# Patient Record
Sex: Male | Born: 1945 | Race: White | Hispanic: No | Marital: Married | Smoking: Former smoker
Health system: Southern US, Community
[De-identification: ages and names within clinical notes are randomized; demographics above are authoritative.]

## PROBLEM LIST (undated history)

## (undated) DIAGNOSIS — Z5189 Encounter for other specified aftercare: Secondary | ICD-10-CM

## (undated) DIAGNOSIS — J449 Chronic obstructive pulmonary disease, unspecified: Secondary | ICD-10-CM

## (undated) DIAGNOSIS — K219 Gastro-esophageal reflux disease without esophagitis: Secondary | ICD-10-CM

## (undated) DIAGNOSIS — F419 Anxiety disorder, unspecified: Secondary | ICD-10-CM

## (undated) DIAGNOSIS — Z923 Personal history of irradiation: Secondary | ICD-10-CM

## (undated) DIAGNOSIS — E7800 Pure hypercholesterolemia, unspecified: Secondary | ICD-10-CM

## (undated) DIAGNOSIS — I10 Essential (primary) hypertension: Secondary | ICD-10-CM

## (undated) DIAGNOSIS — K259 Gastric ulcer, unspecified as acute or chronic, without hemorrhage or perforation: Secondary | ICD-10-CM

## (undated) DIAGNOSIS — N189 Chronic kidney disease, unspecified: Secondary | ICD-10-CM

## (undated) DIAGNOSIS — C801 Malignant (primary) neoplasm, unspecified: Secondary | ICD-10-CM

## (undated) DIAGNOSIS — R7303 Prediabetes: Secondary | ICD-10-CM

## (undated) DIAGNOSIS — D126 Benign neoplasm of colon, unspecified: Secondary | ICD-10-CM

## (undated) HISTORY — DX: Anxiety disorder, unspecified: F41.9

## (undated) HISTORY — DX: Chronic obstructive pulmonary disease, unspecified: J44.9

## (undated) HISTORY — DX: Gastric ulcer, unspecified as acute or chronic, without hemorrhage or perforation: K25.9

## (undated) HISTORY — DX: Encounter for other specified aftercare: Z51.89

## (undated) HISTORY — DX: Gastro-esophageal reflux disease without esophagitis: K21.9

## (undated) HISTORY — DX: Benign neoplasm of colon, unspecified: D12.6

---

## 2000-03-29 ENCOUNTER — Ambulatory Visit (HOSPITAL_COMMUNITY): Admission: RE | Admit: 2000-03-29 | Discharge: 2000-03-29 | Payer: Self-pay | Admitting: *Deleted

## 2005-04-26 ENCOUNTER — Ambulatory Visit (HOSPITAL_COMMUNITY): Admission: RE | Admit: 2005-04-26 | Discharge: 2005-04-26 | Payer: Self-pay | Admitting: Gastroenterology

## 2006-12-05 HISTORY — PX: COLONOSCOPY: SHX174

## 2011-11-21 ENCOUNTER — Encounter: Payer: Self-pay | Admitting: Emergency Medicine

## 2011-11-21 ENCOUNTER — Inpatient Hospital Stay (HOSPITAL_COMMUNITY)
Admission: EM | Admit: 2011-11-21 | Discharge: 2011-11-24 | DRG: 174 | Disposition: A | Discharge status: Home / Self Care | Payer: BC Managed Care – PPO | Attending: Internal Medicine | Admitting: Internal Medicine

## 2011-11-21 DIAGNOSIS — E860 Dehydration: Secondary | ICD-10-CM

## 2011-11-21 DIAGNOSIS — M79606 Pain in leg, unspecified: Secondary | ICD-10-CM

## 2011-11-21 DIAGNOSIS — D649 Anemia, unspecified: Secondary | ICD-10-CM

## 2011-11-21 DIAGNOSIS — M79609 Pain in unspecified limb: Secondary | ICD-10-CM | POA: Diagnosis present

## 2011-11-21 DIAGNOSIS — D62 Acute posthemorrhagic anemia: Secondary | ICD-10-CM | POA: Diagnosis present

## 2011-11-21 DIAGNOSIS — I10 Essential (primary) hypertension: Secondary | ICD-10-CM

## 2011-11-21 DIAGNOSIS — I951 Orthostatic hypotension: Secondary | ICD-10-CM | POA: Diagnosis present

## 2011-11-21 DIAGNOSIS — R42 Dizziness and giddiness: Secondary | ICD-10-CM

## 2011-11-21 DIAGNOSIS — K269 Duodenal ulcer, unspecified as acute or chronic, without hemorrhage or perforation: Secondary | ICD-10-CM | POA: Diagnosis present

## 2011-11-21 DIAGNOSIS — S32009A Unspecified fracture of unspecified lumbar vertebra, initial encounter for closed fracture: Secondary | ICD-10-CM | POA: Diagnosis present

## 2011-11-21 DIAGNOSIS — W19XXXA Unspecified fall, initial encounter: Secondary | ICD-10-CM | POA: Diagnosis present

## 2011-11-21 DIAGNOSIS — M549 Dorsalgia, unspecified: Secondary | ICD-10-CM | POA: Diagnosis present

## 2011-11-21 DIAGNOSIS — K264 Chronic or unspecified duodenal ulcer with hemorrhage: Principal | ICD-10-CM | POA: Diagnosis present

## 2011-11-21 DIAGNOSIS — E7800 Pure hypercholesterolemia, unspecified: Secondary | ICD-10-CM | POA: Diagnosis present

## 2011-11-21 HISTORY — DX: Essential (primary) hypertension: I10

## 2011-11-21 HISTORY — DX: Pure hypercholesterolemia, unspecified: E78.00

## 2011-11-21 NOTE — ED Notes (Signed)
Dizziness and vertigo all day. Worse tonight. Pt clammy, diaphoretic upon EMS arrival. 12 Lead NSR. Stoke assessment negative per EMS. Pt reports pain and visual disturbances to L eye. Pt has fallen twice this evening and has a skin tear on L elbow. Pt lives with wife. Pt a/o x 4 per EMS.

## 2011-11-21 NOTE — ED Notes (Signed)
ZOX:[id number]<AV> Expected date:11/21/11<BR> Expected time:10:50 PM<BR> Means of arrival:Ambulance<BR> Comments:<BR> EMS 5 RK, 65 yom fall w dizziness

## 2011-11-22 ENCOUNTER — Emergency Department (HOSPITAL_COMMUNITY): Payer: BC Managed Care – PPO

## 2011-11-22 ENCOUNTER — Encounter (HOSPITAL_COMMUNITY): Payer: Self-pay | Admitting: Nurse Practitioner

## 2011-11-22 ENCOUNTER — Other Ambulatory Visit: Payer: Self-pay

## 2011-11-22 DIAGNOSIS — I10 Essential (primary) hypertension: Secondary | ICD-10-CM | POA: Diagnosis not present

## 2011-11-22 DIAGNOSIS — I959 Hypotension, unspecified: Secondary | ICD-10-CM

## 2011-11-22 DIAGNOSIS — W19XXXA Unspecified fall, initial encounter: Secondary | ICD-10-CM | POA: Diagnosis present

## 2011-11-22 DIAGNOSIS — K922 Gastrointestinal hemorrhage, unspecified: Secondary | ICD-10-CM

## 2011-11-22 DIAGNOSIS — M79606 Pain in leg, unspecified: Secondary | ICD-10-CM | POA: Diagnosis present

## 2011-11-22 DIAGNOSIS — I951 Orthostatic hypotension: Secondary | ICD-10-CM | POA: Diagnosis present

## 2011-11-22 DIAGNOSIS — R42 Dizziness and giddiness: Secondary | ICD-10-CM | POA: Diagnosis present

## 2011-11-22 DIAGNOSIS — M549 Dorsalgia, unspecified: Secondary | ICD-10-CM | POA: Diagnosis present

## 2011-11-22 DIAGNOSIS — E860 Dehydration: Secondary | ICD-10-CM | POA: Diagnosis present

## 2011-11-22 DIAGNOSIS — D649 Anemia, unspecified: Secondary | ICD-10-CM

## 2011-11-22 LAB — CBC
HCT: 27.6 % — ABNORMAL LOW (ref 39.0–52.0)
MCH: 32.8 pg (ref 26.0–34.0)
MCH: 32.3 pg (ref 26.0–34.0)
MCHC: 34.9 g/dL (ref 30.0–36.0)
MCV: 91.4 fL (ref 78.0–100.0)
MCV: 92.6 fL (ref 78.0–100.0)
Platelets: 138 10*3/uL — ABNORMAL LOW (ref 150–400)
Platelets: 122 10*3/uL — ABNORMAL LOW (ref 150–400)
RBC: 3.02 MIL/uL — ABNORMAL LOW (ref 4.22–5.81)
RDW: 12.9 % (ref 11.5–15.5)
RDW: 13 % (ref 11.5–15.5)

## 2011-11-22 LAB — COMPREHENSIVE METABOLIC PANEL
ALT: 16 U/L (ref 0–53)
Calcium: 8.7 mg/dL (ref 8.4–10.5)
Creatinine, Ser: 1.26 mg/dL (ref 0.50–1.35)
GFR calc Af Amer: 67 mL/min — ABNORMAL LOW (ref >90)
GFR calc non Af Amer: 58 mL/min — ABNORMAL LOW (ref >90)
Glucose, Bld: 108 mg/dL — ABNORMAL HIGH (ref 70–99)
Sodium: 142 mEq/L (ref 135–145)
Total Protein: 5.4 g/dL — ABNORMAL LOW (ref 6.0–8.3)

## 2011-11-22 LAB — URINALYSIS, ROUTINE W REFLEX MICROSCOPIC
Glucose, UA: NEGATIVE mg/dL
Ketones, ur: NEGATIVE mg/dL
Leukocytes, UA: NEGATIVE
Nitrite: NEGATIVE
Protein, ur: NEGATIVE mg/dL
Urobilinogen, UA: 0.2 mg/dL (ref 0.0–1.0)

## 2011-11-22 LAB — TROPONIN I: Troponin I: <0.3 ng/mL (ref <0.30)

## 2011-11-22 LAB — DIFFERENTIAL
Eosinophils Absolute: 0.1 10*3/uL (ref 0.0–0.7)
Eosinophils Relative: 1 % (ref 0–5)
Lymphs Abs: 3.3 10*3/uL (ref 0.7–4.0)
Monocytes Absolute: 0.7 10*3/uL (ref 0.1–1.0)

## 2011-11-22 LAB — CREATININE, SERUM: Creatinine, Ser: 1.28 mg/dL (ref 0.50–1.35)

## 2011-11-22 LAB — PROTIME-INR: Prothrombin Time: 14.4 seconds (ref 11.6–15.2)

## 2011-11-22 LAB — ETHANOL: Alcohol, Ethyl (B): <11 mg/dL (ref 0–11)

## 2011-11-22 LAB — CARDIAC PANEL(CRET KIN+CKTOT+MB+TROPI)
CK, MB: 2 ng/mL (ref 0.3–4.0)
Total CK: 74 U/L (ref 7–232)
Troponin I: <0.3 ng/mL (ref <0.30)

## 2011-11-22 MED ORDER — SENNA 8.6 MG PO TABS
1.0000 | ORAL_TABLET | Freq: Two times a day (BID) | ORAL | Status: DC
  Administered 2011-11-23 – 2011-11-24 (×3): 8.6 mg via ORAL
  Filled 2011-11-22 (×2): qty 1

## 2011-11-22 MED ORDER — LORAZEPAM 2 MG/ML IJ SOLN
INTRAMUSCULAR | Status: AC
  Filled 2011-11-22: qty 1

## 2011-11-22 MED ORDER — HYDROMORPHONE HCL PF 1 MG/ML IJ SOLN: 1.0000 mg | INTRAMUSCULAR | Status: DC | PRN

## 2011-11-22 MED ORDER — SODIUM CHLORIDE 0.9 % IV SOLN
INTRAVENOUS | Status: DC
  Administered 2011-11-22 (×2): via INTRAVENOUS

## 2011-11-22 MED ORDER — ASPIRIN EC 81 MG PO TBEC: 81.0000 mg | DELAYED_RELEASE_TABLET | Freq: Every day | ORAL | Status: DC

## 2011-11-22 MED ORDER — ENOXAPARIN SODIUM 40 MG/0.4ML ~~LOC~~ SOLN
40.0000 mg | SUBCUTANEOUS | Status: DC
  Administered 2011-11-22: 40 mg via SUBCUTANEOUS
  Filled 2011-11-22: qty 0.4

## 2011-11-22 MED ORDER — ONDANSETRON HCL 4 MG PO TABS: 4.0000 mg | ORAL_TABLET | Freq: Four times a day (QID) | ORAL | Status: DC | PRN

## 2011-11-22 MED ORDER — SODIUM CHLORIDE 0.9 % IV SOLN
8.0000 mg/h | INTRAVENOUS | Status: DC
  Administered 2011-11-22 – 2011-11-23 (×2): 8 mg/h via INTRAVENOUS
  Filled 2011-11-22 (×7): qty 80

## 2011-11-22 MED ORDER — SODIUM CHLORIDE 0.9 % IV BOLUS (SEPSIS)
500.0000 mL | Freq: Once | INTRAVENOUS | Status: AC
  Administered 2011-11-22: 500 mL via INTRAVENOUS

## 2011-11-22 MED ORDER — ACETAMINOPHEN 650 MG RE SUPP: 650.0000 mg | Freq: Four times a day (QID) | RECTAL | Status: DC | PRN

## 2011-11-22 MED ORDER — ONDANSETRON HCL 4 MG/2ML IJ SOLN
4.0000 mg | Freq: Once | INTRAMUSCULAR | Status: AC
  Administered 2011-11-22: 4 mg via INTRAVENOUS
  Filled 2011-11-22: qty 2

## 2011-11-22 MED ORDER — ROSUVASTATIN CALCIUM 20 MG PO TABS
20.0000 mg | ORAL_TABLET | Freq: Every day | ORAL | Status: DC
  Administered 2011-11-23 – 2011-11-24 (×2): 20 mg via ORAL
  Filled 2011-11-22 (×2): qty 1

## 2011-11-22 MED ORDER — SODIUM CHLORIDE 0.9 % IV SOLN: INTRAVENOUS | Status: DC

## 2011-11-22 MED ORDER — SODIUM CHLORIDE 0.9 % IV BOLUS (SEPSIS)
500.0000 mL | Freq: Once | INTRAVENOUS | Status: AC
  Administered 2011-11-22: 500 mL via INTRAVENOUS
  Administered 2011-11-22: 08:00:00 via INTRAVENOUS

## 2011-11-22 MED ORDER — SODIUM CHLORIDE 0.45 % IV SOLN: Freq: Once | INTRAVENOUS | Status: DC

## 2011-11-22 MED ORDER — ONDANSETRON HCL 4 MG/2ML IJ SOLN: 4.0000 mg | Freq: Four times a day (QID) | INTRAMUSCULAR | Status: DC | PRN

## 2011-11-22 MED ORDER — OXYCODONE HCL 5 MG PO TABS: 5.0000 mg | ORAL_TABLET | ORAL | Status: DC | PRN

## 2011-11-22 MED ORDER — LORAZEPAM 2 MG/ML IJ SOLN
1.0000 mg | Freq: Once | INTRAMUSCULAR | Status: AC
  Administered 2011-11-22: 1 mg via INTRAVENOUS

## 2011-11-22 MED ORDER — IOHEXOL 300 MG/ML  SOLN
100.0000 mL | Freq: Once | INTRAMUSCULAR | Status: AC | PRN
  Administered 2011-11-22: 100 mL via INTRAVENOUS

## 2011-11-22 MED ORDER — ACETAMINOPHEN 325 MG PO TABS: 650.0000 mg | ORAL_TABLET | Freq: Four times a day (QID) | ORAL | Status: DC | PRN

## 2011-11-22 NOTE — Consult Note (Signed)
[facility] Consultation  Referring Provider: [facility] Cleotis Lema) Primary Care Physician:  Kristian Covey, MD Primary Gastroenterologist:   Formerly Hung(discharged from practice). Dr. Christella Hartigan accepts him as patient. Reason for Consultation:  Hematochezia.   HPI: Frantz Quattrone is a 65 y.o. male man in relatively good health in the ER after an acute episode of shortness of breath/ dizziness and fall at home last night around 9pm. In ER patient was hypotensive. His chest CTscan is negative. His head CTscan is negative as well. Patient was hypotensive in ER with SBP of 79. He is now hemodynamically stable after IV fluid resusitation. A few hours ago patient passed a large bloody stool which wife describes as black and sticky with some bright red blood in it. Patient denies any abdominal pain. No nausea. He has taken a baby asa for years but no other NSAIDS. No history of PUD. He apparently had a colonoscopy about 4 years ago by Dr. Elnoria Howard. Wife recalls removal of a couple of polyps. She doesn't recall any findings of diverticulosis. No FMH of colon cancer. Patient's wife is a patient of Dr. Christella Hartigan  Past Medical History  Diagnosis Date  . Hypertension   . High cholesterol     History reviewed. No pertinent past surgical history.  Prior to Admission medications   Medication Sig Start Date End Date Taking? Authorizing Provider  aspirin EC 81 MG tablet Take 81 mg by mouth daily.     Yes Historical Provider, MD  benazepril (LOTENSIN) 20 MG tablet Take 20 mg by mouth daily.     Yes Historical Provider, MD  rosuvastatin (CRESTOR) 20 MG tablet Take 20 mg by mouth daily.     Yes Historical Provider, MD    Current Facility-Administered Medications  Medication Dose Route Frequency Provider Last Rate Last Dose  . 0.9 %  sodium chloride infusion   Intravenous Continuous Flint Melter, MD 125 mL/hr at 11/22/11 1158    . LORazepam (ATIVAN) 2 MG/ML injection           . LORazepam  (ATIVAN) injection 1 mg  1 mg Intravenous Once Sosan Abdullah   1 mg at 11/22/11 0910  . ondansetron (ZOFRAN) injection 4 mg  4 mg Intravenous Once Flint Melter, MD   4 mg at 11/22/11 0428  . sodium chloride 0.9 % bolus 500 mL  500 mL Intravenous Once Flint Melter, MD   500 mL at 11/22/11 0428  . sodium chloride 0.9 % bolus 500 mL  500 mL Intravenous Once Flint Melter, MD      . DISCONTD: enoxaparin (LOVENOX) injection 40 mg  40 mg Subcutaneous Q24H Sosan Abdullah   40 mg at 11/22/11 1354   Current Outpatient Prescriptions  Medication Sig Dispense Refill  . aspirin EC 81 MG tablet Take 81 mg by mouth daily.        . benazepril (LOTENSIN) 20 MG tablet Take 20 mg by mouth daily.        . rosuvastatin (CRESTOR) 20 MG tablet Take 20 mg by mouth daily.          Allergies as of 11/21/2011  . (No Known Allergies)    FMH: No colon cancer   History   Social History  . Marital Status: Married    Spouse Name: N/A    Number of Children: N/A  . Years of Education: N/A   Occupational History  . Not on file.   Social History Main Topics  . Smoking status: Never  Smoker   . Smokeless tobacco: Not on file  . Alcohol Use: No  . Drug Use:   . Sexually Active:    Other Topics Concern  . Not on file   Social History Narrative  . No narrative on file    Review of Systems: Positive for chronic back pain, weakness.   PHYSICAL EXAM: Vital signs in last 24 hours: Temp:  [98.2 F (36.8 C)-98.4 F (36.9 C)] 98.2 F (36.8 C) (12/18 1010) Pulse Rate:  [78-119] 78  (12/18 1539) Resp:  [16] 16  (12/18 1010) BP: (79-112)/(56-92) 110/68 mmHg (12/18 1539) SpO2:  [94 %-100 %] 100 % (12/18 1539)   General:  Well-developed  white male in NAD Head:  Normocephalic and atraumatic. Eyes:   No icterus.   Conjunctiva pale. Ears:  Normal auditory acuity. Mouth:  Tongue slightly dry. Neck:  Supple; no masses felt Lungs:  Respirations even and unlabored. Lungs clear to auscultation  bilaterlly.   No wheezes, crackles, or rhonchi.  Heart:  Regular rate and rhythm Abdomen:  Soft, nondistended, nontender. Normal bowel sounds. No appreciable masses or hepatomegaly.  Rectal:  Several hemorrhoids. Dark red stool in vault..  Msk:  Symmetrical without gross deformities.  Extremities:  Without edema. Neurologic:  Alert and  oriented x4;  grossly normal neurologically. Skin:  Intact without significant lesions or rashes. Cervical Nodes:  No significant cervical adenopathy. Psych:  Alert and cooperative.Marland Kitchen   LAB RESULTS:  Basename 11/22/11 0400  WBC 10.9*  HGB 9.9*  HCT 27.6*  PLT 138*   BMET  Basename 11/22/11 0400  NA 142  K 4.2  CL 110  CO2 26  GLUCOSE 108*  BUN 39*  CREATININE 1.26  CALCIUM 8.7   LFT  Basename 11/22/11 0400  PROT 5.4*  ALBUMIN 3.1*  AST 12  ALT 16  ALKPHOS 51  BILITOT 0.1*  BILIDIR --  IBILI --   STUDIES: Dg Chest 2 View  11/22/2011  *RADIOLOGY REPORT*  Clinical Data: Weakness, soreness.  CHEST - 2 VIEW  Comparison: None.  Findings: Lungs are clear.  No pleural effusion or pneumothorax. No acute osseous abnormality.  Cardiomediastinal contours within normal limits.  IMPRESSION: No acute process identified.  Original Report Authenticated By: Waneta Martins, M.D.   Dg Lumbar Spine Complete  11/22/2011  *RADIOLOGY REPORT*  Clinical Data: Left leg pain, weakness, soreness.  LUMBAR SPINE - COMPLETE 4+ VIEW  Comparison: None.  Findings: Mild multilevel degenerative changes.  No acute fracture or dislocation identified.  The vertebral body heights and intervertebral disc spaces are maintained.  Advanced atherosclerotic calcification.  SI joints appear intact.  The sacrum is partially obscured by overlying bowel.  IMPRESSION: Mild multilevel degenerative changes.  No acute osseous abnormality identified.  Original Report Authenticated By: Waneta Martins, M.D.   Ct Head Wo Contrast  11/22/2011  *RADIOLOGY REPORT*  Clinical Data:  Dizziness, vertigo.  CT HEAD WITHOUT CONTRAST  Technique:  Contiguous axial images were obtained from the base of the skull through the vertex without contrast.  Comparison: None.  Findings: There is no evidence for acute hemorrhage, hydrocephalus, mass lesion, or abnormal extra-axial fluid collection.  No definite CT evidence for acute infarction.  The visualized paranasal sinuses and mastoid air cells are predominately clear.  IMPRESSION: No acute intracranial abnormality.  Original Report Authenticated By: Waneta Martins, M.D.   Mr Thoracic Spine Wo Contrast  11/22/2011  **ADDENDUM** CREATED: 11/22/2011 11:21:15  There was no given history of fall or trauma at  the time of initial interpretation however I did receive a call from the hospitalist caring for the patient. Prior radiograph history specifically states no injury.  Patient did have recent fall and subsequent onset of back pain and radicular pain.  This makes the abnormal signal with bone marrow edema at S3 consistent with nondisplaced sacral fracture.  We discussed further evaluation with CT scan (recommended by orthopedics) of the sacrum which would probably not yield additional data based on the history and MRI findings.  Per CMS PQRS reporting requirements (PQRS Measure 24): Given the patient's age of greater than 50 and the fracture site (hip, distal radius, or spine), the patient should be tested for osteoporosis using DXA, and the appropriate treatment considered based on the DXA results.  **END ADDENDUM** SIGNED BY: Andreas Newport, M.D.   11/22/2011  *RADIOLOGY REPORT*  Clinical Data:  Sudden onset mid to low back pain with left leg weakness.  Claustrophobia.  Focal left leg weakness.  MRI THORACIC AND LUMBAR SPINE WITHOUT CONTRAST  Technique:  Multiplanar and multiecho pulse sequences of the thoracic and lumbar spine were obtained without intravenous contrast.  Comparison:  11/22/2011.  MRI THORACIC SPINE  Findings: Thoracic spinal  alignment is anatomic.  Counting was performed from the craniocervical junction.  Thoracic and lumbar vertebral levels are anatomic.  No segmentation abnormalities.  The thoracic cord demonstrates normal caliber and intramedullary signal.  Vertebral body height is preserved.  Marrow signal is normal with exception of a hemangioma or degenerative endplate changes in the inferior aspect of L1.  The paraspinal soft tissues appear within normal limits.  There is no spinal or foraminal stenosis identified.  IMPRESSION: Negative thoracic spine MRI.  MRI LUMBAR SPINE  Findings: The patient was unable to perform sagittal T2-weighted images.  Inversion recovery images were obtained.  Vertebral body height is preserved.  The axial images are motion degraded.  There is a tiny focus of bone marrow edema in the sacrum at S2-S3, suspicious for nondisplaced fracture.  This is not visualized on axial images.  L1-L2:  Mild disc degeneration with anterior bulging.  No stenosis.  L2-L3:  Mild disc degeneration.  No stenosis.  L3-L4:  Negative.  L4-L5:  Circumferential disc bulge.  Mild bilateral foraminal stenosis associated with bulging disc.   Mild degenerative endplate changes with subchondral marrow edema on the right. Central canal and lateral recesses grossly appear patent.  L5-S1:  Shallow circumferential disc bulge.  IMPRESSION: 1.  Motion degraded study.  Repeat images were attempted however the patient continued to move throughout the study and axial images were markedly motion degraded.  2.  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 3.  Suggestion of a nondisplaced sacral fracture at S3 with mild marrow edema is seen on sagittal images. Original Report Authenticated By: Andreas Newport, M.D.    PREVIOUS ENDOSCOPIES: [facility] several years ago, colonoscopy, polyps were found, he was told to have repeat in 3 years but didn't.  Was discharged from [facility]  practice for unclear reasons  IMPRESSION / PLAN: 31. 65 year old white male with acute shortness of breath, dizziness, and hypotension leading to a fall at home last night. Chest CTscan and head CT negative. See #2.  2. Painless hematochezia associated with hypotension. This could actually be an upper GI bleed, especially given elevated BUN. Of course, he could have ischemic colitis secondary to hypotension but this seems less likely. Diverticular bleed seems less likely given details of  events. Favor UGIB. Patient needs close monitoring, recommend  stepdown instead of telemetry. Will begin Protonix drip, check coags, monitor H&H. Keep patient NPO, type and cross blood. Plan is for EGD tomorrow depending on clinical course. Should we start to favor a lower bleed then he may be prepped for a colonoscopy instead.  3.  Normocytic anemia, Hemoglobin 9.9. No baseline hemoglobin available to compare. Given hypotension and dizziness I have low threshold for transfusing him. Another hemoglobin is pending, if less than 9.0 will order 2 units PRBC to be given.  Thanks   LOS: 1 day   Willette Cluster  11/22/2011, 4:18 PM     ________________________________________________________________________  [facility] MD note:  I personally examined the patient, reviewed the data and agree with the assessment and plan described above. GI bleeding.  BUN elevated, this seems more likely an upper GI bleed. He was given lovenox in ER before the bleeding became apparent.  I think he should be admitted to step down unit, IV PPI bolus and drip, then EGD tomorrow AM.  Hb is 12 hours old (he has been in ER for almost 20 hours now) and so will repeat now and transfuse blood products as needed.  Needs coags with cbc as well, then q 8 hour H/H.     Rob Bunting, MD [facility] Pager [phone]

## 2011-11-22 NOTE — ED Notes (Signed)
Pt c/o difficulty breathing. Pt o2 sats 100% on room air. Family at bedside. Pt c/o L leg cramping.

## 2011-11-22 NOTE — ED Notes (Signed)
Patient returned from X-ray 

## 2011-11-22 NOTE — ED Notes (Signed)
Called pharmacy regarding protonix drip.  They are going to send it.

## 2011-11-22 NOTE — ED Notes (Signed)
Patient transported to CT 

## 2011-11-22 NOTE — H&P (Addendum)
PCP:  Kristian Covey, MD   DOA:  11/21/2011 10:57 PM  Chief Complaint:  Dizziness Left leg pain and lower back pain. Status post fall HPI: 65 years old Caucasian man presented to the ER today with chief complaint of dizziness and lightheadedness. Patient stated that his symptoms worsen with standing. He is feeling generally weak and had an episode of shortness of breath this morning which has resolved. He denies any associated chest pain, palpitation, nausea or diaphoresis. Patient stated that he felt dizzy and weak this morning and fell, he was unable to provide details about his fall. He denies any nausea, vomiting, diarrhea or decreasing his oral intake. Patient is also complaining of chronic lower back pain and right leg pain which he described as dull, 5/10 he denies any numbness, bladder or bowel incontinence. In the ED patient was found to be orthostatic and hypotensive and was given IV fluids. Had an MRI of lumbosacral spine which showed degenerative joint disease and suspected sacral fracture.  Allergies: No Known Allergies  Prior to Admission medications   Medication Sig Start Date End Date Taking? Authorizing Provider  aspirin EC 81 MG tablet Take 81 mg by mouth daily.     Yes Historical Provider, MD  benazepril (LOTENSIN) 20 MG tablet Take 20 mg by mouth daily.     Yes Historical Provider, MD  rosuvastatin (CRESTOR) 20 MG tablet Take 20 mg by mouth daily.     Yes Historical Provider, MD    Past Medical History  Diagnosis Date  . Hypertension   . High cholesterol       Chronic back pain   Social History: He lives with his wife in [place], smokes one pack a day of cigarette for more than 20 years, denies any EtOH or illicit drug use.   family history : Father had a heart attack in his 30s  Review of Systems:  Constitutional: Denies fever, chills, diaphoresis, appetite change and fatigue.  Respiratory: transient  SOB, deniesDOE, cough, chest tightness,  and  wheezing.   Cardiovascular: Denies chest pain, palpitations and leg swelling.  Gastrointestinal: Denies nausea, vomiting, abdominal pain, diarrhea, constipation, blood in stool and abdominal distention.  Genitourinary: Denies dysuria, urgency, frequency, hematuria, flank pain and difficulty urinating.  Musculoskeletal: Denies myalgias,C/O back pain and left leg pain, joint swelling,and gait problem.  Skin: Denies pallor, rash and wound.  Neurological: c/o dizziness/ light-headedness, denies seizures, syncope, weakness,, numbness and headaches.    Physical Exam:  Alert but drowsy, oriented x3  Filed Vitals:   11/22/11 0743 11/22/11 0905 11/22/11 0911 11/22/11 1010  BP: 112/68 101/68 106/84 105/61  Pulse: 82 88 87 86  Temp: 98.4 F (36.9 C)   98.2 F (36.8 C)  TempSrc: Oral   Oral  Resp:    16  SpO2: 100%   95%    Alert and oriented x3.  Eyes: PERRL, EOMI, conjunctivae normal, No scleral icterus.  Neck: Supple, Trachea midline normal ROM, No JVD, mass, thyromegaly, or carotid bruit present.  Cardiovascular: RRR, S1 normal, S2 normal, no MRG, pulses symmetric and intact bilaterally Pulmonary/Chest: CTAB, no wheezes, rales, or rhonchi Abdominal: Soft. Non-tender, non-distended, bowel sounds are normal, no masses, organomegaly, or guarding present.  GU: no CVA tenderness Musculoskeletal: No joint deformities, erythema, or stiffness, left leg /hip with LROM  Ext: no edema and no cyanosis, pulses palpable bilaterally (DP and PT) Neurological: A&O x3, Strenght is normal and symmetric bilaterally, cranial nerve II-XII are grossly intact, no focal motor deficit, sensory  intact to light touch bilaterally.     Labs on Admission:  Results for orders placed during the hospital encounter of 11/21/11 (from the past 48 hour(s))  CBC     Status: Abnormal   Collection Time   11/22/11  4:00 AM      Component Value Range Comment   WBC 10.9 (*) 4.0 - 10.5 (K/uL)    RBC 3.02 (*) 4.22 - 5.81  (MIL/uL)    Hemoglobin 9.9 (*) 13.0 - 17.0 (g/dL)    HCT 40.9 (*) 81.1 - 52.0 (%)    MCV 91.4  78.0 - 100.0 (fL)    MCH 32.8  26.0 - 34.0 (pg)    MCHC 35.9  30.0 - 36.0 (g/dL)    RDW 91.4  78.2 - 95.6 (%)    Platelets 138 (*) 150 - 400 (K/uL)   DIFFERENTIAL     Status: Normal   Collection Time   11/22/11  4:00 AM      Component Value Range Comment   Neutrophils Relative 62  43 - 77 (%)    Neutro Abs 6.7  1.7 - 7.7 (K/uL)    Lymphocytes Relative 30  12 - 46 (%)    Lymphs Abs 3.3  0.7 - 4.0 (K/uL)    Monocytes Relative 7  3 - 12 (%)    Monocytes Absolute 0.7  0.1 - 1.0 (K/uL)    Eosinophils Relative 1  0 - 5 (%)    Eosinophils Absolute 0.1  0.0 - 0.7 (K/uL)    Basophils Relative 0  0 - 1 (%)    Basophils Absolute 0.0  0.0 - 0.1 (K/uL)   COMPREHENSIVE METABOLIC PANEL     Status: Abnormal   Collection Time   11/22/11  4:00 AM      Component Value Range Comment   Sodium 142  135 - 145 (mEq/L)    Potassium 4.2  3.5 - 5.1 (mEq/L)    Chloride 110  96 - 112 (mEq/L)    CO2 26  19 - 32 (mEq/L)    Glucose, Bld 108 (*) 70 - 99 (mg/dL)    BUN 39 (*) 6 - 23 (mg/dL)    Creatinine, Ser 2.13  0.50 - 1.35 (mg/dL)    Calcium 8.7  8.4 - 10.5 (mg/dL)    Total Protein 5.4 (*) 6.0 - 8.3 (g/dL)    Albumin 3.1 (*) 3.5 - 5.2 (g/dL)    AST 12  0 - 37 (U/L)    ALT 16  0 - 53 (U/L)    Alkaline Phosphatase 51  39 - 117 (U/L)    Total Bilirubin 0.1 (*) 0.3 - 1.2 (mg/dL)    GFR calc non Af Amer 58 (*) >90 (mL/min)    GFR calc Af Amer 67 (*) >90 (mL/min)   TROPONIN I     Status: Normal   Collection Time   11/22/11  4:00 AM      Component Value Range Comment   Troponin I <0.30  <0.30 (ng/mL)   ETHANOL     Status: Normal   Collection Time   11/22/11  4:00 AM      Component Value Range Comment   Alcohol, Ethyl (B) <11  0 - 11 (mg/dL)   URINALYSIS, ROUTINE W REFLEX MICROSCOPIC     Status: Abnormal   Collection Time   11/22/11  5:51 AM      Component Value Range Comment   Color, Urine YELLOW  YELLOW      APPearance  CLOUDY (*) CLEAR     Specific Gravity, Urine 1.021  1.005 - 1.030     pH 5.5  5.0 - 8.0     Glucose, UA NEGATIVE  NEGATIVE (mg/dL)    Hgb urine dipstick NEGATIVE  NEGATIVE     Bilirubin Urine NEGATIVE  NEGATIVE     Ketones, ur NEGATIVE  NEGATIVE (mg/dL)    Protein, ur NEGATIVE  NEGATIVE (mg/dL)    Urobilinogen, UA 0.2  0.0 - 1.0 (mg/dL)    Nitrite NEGATIVE  NEGATIVE     Leukocytes, UA NEGATIVE  NEGATIVE  MICROSCOPIC NOT DONE ON URINES WITH NEGATIVE PROTEIN, BLOOD, LEUKOCYTES, NITRITE, OR GLUCOSE <1000 mg/dL.    Radiological Exams on Admission: No results found. Dg Chest 2 View  11/22/2011  *RADIOLOGY REPORT*  Clinical Data: Weakness, soreness.  CHEST - 2 VIEW  Comparison:  IMPRESSION: No acute process identified.  Original Report Authenticated By: Waneta Martins, M.D.   Dg Lumbar Spine Complete  11/22/2011  *RADIOLOGY REPORT*  Clinical Data: Left leg pain, weakness, soreness.  LUMBAR SPINE - COMPLETE .  IMPRESSION: Mild multilevel degenerative changes.  No acute osseous abnormality identified.  Original Report Authenticated By: Waneta Martins, M.D.   Ct Head Wo Contrast  11/22/2011  *RADIOLOGY REPORT*  Clinical Data: Dizziness, vertigo.  CT HEAD WITHOUT CONTRAST  .  IMPRESSION: No acute intracranial abnormality.  Original Report Authenticated By: Waneta Martins, M.D.   Mr Thoracic Spine Wo Contrast  11/22/2011  *RADIOLOGY REPORT*  Clinical Data:  Sudden onset mid to low back pain with left leg weakness.  Claustrophobia.  Focal left leg weakness.  MRI THORACIC .  IMPRESSION: Negative thoracic spine MRI.  MRI LUMBAR SPINE  Findings: The patient was unable to perform sagittal T2-weighted images.  Inversion recovery images were obtained.  Vertebral body height is preserved.  The axial images are motion degraded.  There is a tiny focus of bone marrow edema in the sacrum at S2-S3, suspicious for nondisplaced fracture.  This is not visualized on axial images.  L1-L2:   Mild disc degeneration with anterior bulging.  No stenosis.  L2-L3:  Mild disc degeneration.  No stenosis.  L3-L4:  Negative.  L4-L5:  Circumferential disc bulge.  Mild bilateral foraminal stenosis associated with bulging disc.   Mild degenerative endplate changes with subchondral marrow edema on the right. Central canal and lateral recesses grossly appear patent.  L5-S1:  Shallow circumferential disc bulge.  IMPRESSION: 1.  Motion degraded study.  Repeat images were attempted however the patient continued to move throughout the study and axial images were markedly motion degraded.  2.  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 3.  Suggestion of a nondisplaced sacral fracture at S3 with mild marrow edema is seen on sagittal images.  Original Report Authenticated By: Andreas Newport, M.D.  Assessment/Plan Principal Problem:  *Orthostasis/Dizzness /Dehydration Active Problems:  Back pain/left leg pain/suspected sacral fracture   Hypertension  Fall Anemia Plan: Admit to telemetry Continue IV fluid and bolus as needed, hold benazepril Compression stockings to help with orthostasis Given transient shortness of breath and family history of cardiac disease we'll cycle cardiac enzymes and check d-dimer.EKG showed NSR and nonspecific T wave changes .check 2 D Echo  Pain control and PT evaluation .[place] [facility] was  Consulted  . Check anemia panel, and a stool for occult blood Patient is full code    Time Spent on Admission: 45 minutes the patient had voided amount  of dark clot I loose stool. Lovenox and aspirin were discontinued, I ordered for type and screen, H&H, [facility] was consulted.   Addendum I was informed by RN  Lindsay Straka 11/22/2011, 10:35 AM

## 2011-11-22 NOTE — Progress Notes (Signed)
  Echocardiogram 2D Echocardiogram has been performed.  Jack Taylor, RDCS 11/22/2011, 1:18 PM

## 2011-11-22 NOTE — ED Notes (Signed)
Blood bank notified to crossmatch for two units. [place] Dec)

## 2011-11-22 NOTE — ED Notes (Signed)
Jack Taylor from MRI-patient states is extremely claustrophobic. MD notified.

## 2011-11-22 NOTE — ED Notes (Signed)
MD at bedside. 

## 2011-11-22 NOTE — ED Notes (Signed)
Moderate amount of dark, clot type loose stool in bedside commode. Dr. Cleotis Lema beeped.

## 2011-11-22 NOTE — Consult Note (Signed)
Reason for Consult:Back pain Referring Physician: Hospitalist  Jack Taylor is an 65 y.o. male.  HPI: Patient is a 65 year old gentleman who complained of lightheaded dizziness last couple of days fell yesterday morning landing on backside with some lower buttocks discomfort. This was brought to the emergency room for evaluation x-rays were unremarkable but an MRI of the lumbar spine showed questionable nondisplaced fracture of S3. Patient reports he has been having some right leg pain recently but no numbness or tingling.  Past Medical History  Diagnosis Date  . Hypertension   . High cholesterol     History reviewed. No pertinent past surgical history.  Family History  Problem Relation Age of Onset  . Colon cancer Neg Hx     Social History:  reports that he has never smoked. He does not have any smokeless tobacco history on file. He reports that he does not drink alcohol. His drug history not on file.  Allergies: No Known Allergies  Medications: I have reviewed the patient's current medications.  Results for orders placed during the hospital encounter of 11/21/11 (from the past 48 hour(s))  CBC     Status: Abnormal   Collection Time   11/22/11  4:00 AM      Component Value Range Comment   WBC 10.9 (*) 4.0 - 10.5 (K/uL)    RBC 3.02 (*) 4.22 - 5.81 (MIL/uL)    Hemoglobin 9.9 (*) 13.0 - 17.0 (g/dL)    HCT 16.1 (*) 09.6 - 52.0 (%)    MCV 91.4  78.0 - 100.0 (fL)    MCH 32.8  26.0 - 34.0 (pg)    MCHC 35.9  30.0 - 36.0 (g/dL)    RDW 04.5  40.9 - 81.1 (%)    Platelets 138 (*) 150 - 400 (K/uL)   DIFFERENTIAL     Status: Normal   Collection Time   11/22/11  4:00 AM      Component Value Range Comment   Neutrophils Relative 62  43 - 77 (%)    Neutro Abs 6.7  1.7 - 7.7 (K/uL)    Lymphocytes Relative 30  12 - 46 (%)    Lymphs Abs 3.3  0.7 - 4.0 (K/uL)    Monocytes Relative 7  3 - 12 (%)    Monocytes Absolute 0.7  0.1 - 1.0 (K/uL)    Eosinophils Relative 1  0 - 5 (%)    Eosinophils  Absolute 0.1  0.0 - 0.7 (K/uL)    Basophils Relative 0  0 - 1 (%)    Basophils Absolute 0.0  0.0 - 0.1 (K/uL)   COMPREHENSIVE METABOLIC PANEL     Status: Abnormal   Collection Time   11/22/11  4:00 AM      Component Value Range Comment   Sodium 142  135 - 145 (mEq/L)    Potassium 4.2  3.5 - 5.1 (mEq/L)    Chloride 110  96 - 112 (mEq/L)    CO2 26  19 - 32 (mEq/L)    Glucose, Bld 108 (*) 70 - 99 (mg/dL)    BUN 39 (*) 6 - 23 (mg/dL)    Creatinine, Ser 9.14  0.50 - 1.35 (mg/dL)    Calcium 8.7  8.4 - 10.5 (mg/dL)    Total Protein 5.4 (*) 6.0 - 8.3 (g/dL)    Albumin 3.1 (*) 3.5 - 5.2 (g/dL)    AST 12  0 - 37 (U/L)    ALT 16  0 - 53 (U/L)  Alkaline Phosphatase 51  39 - 117 (U/L)    Total Bilirubin 0.1 (*) 0.3 - 1.2 (mg/dL)    GFR calc non Af Amer 58 (*) >90 (mL/min)    GFR calc Af Amer 67 (*) >90 (mL/min)   TROPONIN I     Status: Normal   Collection Time   11/22/11  4:00 AM      Component Value Range Comment   Troponin I <0.30  <0.30 (ng/mL)   ETHANOL     Status: Normal   Collection Time   11/22/11  4:00 AM      Component Value Range Comment   Alcohol, Ethyl (B) <11  0 - 11 (mg/dL)   URINALYSIS, ROUTINE W REFLEX MICROSCOPIC     Status: Abnormal   Collection Time   11/22/11  5:51 AM      Component Value Range Comment   Color, Urine YELLOW  YELLOW     APPearance CLOUDY (*) CLEAR     Specific Gravity, Urine 1.021  1.005 - 1.030     pH 5.5  5.0 - 8.0     Glucose, UA NEGATIVE  NEGATIVE (mg/dL)    Hgb urine dipstick NEGATIVE  NEGATIVE     Bilirubin Urine NEGATIVE  NEGATIVE     Ketones, ur NEGATIVE  NEGATIVE (mg/dL)    Protein, ur NEGATIVE  NEGATIVE (mg/dL)    Urobilinogen, UA 0.2  0.0 - 1.0 (mg/dL)    Nitrite NEGATIVE  NEGATIVE     Leukocytes, UA NEGATIVE  NEGATIVE  MICROSCOPIC NOT DONE ON URINES WITH NEGATIVE PROTEIN, BLOOD, LEUKOCYTES, NITRITE, OR GLUCOSE <1000 mg/dL.  D-DIMER, QUANTITATIVE     Status: Abnormal   Collection Time   11/22/11 11:02 AM      Component Value  Range Comment   D-Dimer, Quant 1.08 (*) 0.00 - 0.48 (ug/mL-FEU)   CARDIAC PANEL(CRET KIN+CKTOT+MB+TROPI)     Status: Normal   Collection Time   11/22/11 12:10 PM      Component Value Range Comment   Total CK 74  7 - 232 (U/L)    CK, MB 2.0  0.3 - 4.0 (ng/mL)    Troponin I <0.30  <0.30 (ng/mL)    Relative Index RELATIVE INDEX IS INVALID  0.0 - 2.5    TYPE AND SCREEN     Status: Normal (Preliminary result)   Collection Time   11/22/11  5:10 PM      Component Value Range Comment   ABO/RH(D) A POS      Antibody Screen NEG      Sample Expiration 11/25/2011      Unit Number [id number]      Blood Component Type RED CELLS,LR      Unit division 00      Status of Unit ALLOCATED      Transfusion Status OK TO TRANSFUSE      Crossmatch Result Compatible      Unit Number [id number]      Blood Component Type RED CELLS,LR      Unit division 00      Status of Unit ISSUED      Transfusion Status OK TO TRANSFUSE      Crossmatch Result Compatible     CBC     Status: Abnormal   Collection Time   11/22/11  5:10 PM      Component Value Range Comment   WBC 6.8  4.0 - 10.5 (K/uL)    RBC 2.69 (*) 4.22 - 5.81 (MIL/uL)    Hemoglobin  8.7 (*) 13.0 - 17.0 (g/dL)    HCT 91.4 (*) 78.2 - 52.0 (%)    MCV 92.6  78.0 - 100.0 (fL)    MCH 32.3  26.0 - 34.0 (pg)    MCHC 34.9  30.0 - 36.0 (g/dL)    RDW 95.6  21.3 - 08.6 (%)    Platelets 122 (*) 150 - 400 (K/uL)   CREATININE, SERUM     Status: Abnormal   Collection Time   11/22/11  5:10 PM      Component Value Range Comment   Creatinine, Ser 1.28  0.50 - 1.35 (mg/dL)    GFR calc non Af Amer 57 (*) >90 (mL/min)    GFR calc Af Amer 66 (*) >90 (mL/min)   PROTIME-INR     Status: Normal   Collection Time   11/22/11  5:10 PM      Component Value Range Comment   Prothrombin Time 14.4  11.6 - 15.2 (seconds)    INR 1.10  0.00 - 1.49    ABO/RH     Status: Normal   Collection Time   11/22/11  5:10 PM      Component Value Range Comment   ABO/RH(D) A POS       PREPARE RBC (CROSSMATCH)     Status: Normal   Collection Time   11/22/11  6:17 PM      Component Value Range Comment   Order Confirmation ORDER PROCESSED BY BLOOD BANK       Dg Chest 2 View  11/22/2011  *RADIOLOGY REPORT*  Clinical Data: Weakness, soreness.  CHEST - 2 VIEW  Comparison: None.  Findings: Lungs are clear.  No pleural effusion or pneumothorax. No acute osseous abnormality.  Cardiomediastinal contours within normal limits.  IMPRESSION: No acute process identified.  Original Report Authenticated By: Waneta Martins, M.D.   Dg Lumbar Spine Complete  11/22/2011  *RADIOLOGY REPORT*  Clinical Data: Left leg pain, weakness, soreness.  LUMBAR SPINE - COMPLETE 4+ VIEW  Comparison: None.  Findings: Mild multilevel degenerative changes.  No acute fracture or dislocation identified.  The vertebral body heights and intervertebral disc spaces are maintained.  Advanced atherosclerotic calcification.  SI joints appear intact.  The sacrum is partially obscured by overlying bowel.  IMPRESSION: Mild multilevel degenerative changes.  No acute osseous abnormality identified.  Original Report Authenticated By: Waneta Martins, M.D.   Ct Head Wo Contrast  11/22/2011  *RADIOLOGY REPORT*  Clinical Data: Dizziness, vertigo.  CT HEAD WITHOUT CONTRAST  Technique:  Contiguous axial images were obtained from the base of the skull through the vertex without contrast.  Comparison: None.  Findings: There is no evidence for acute hemorrhage, hydrocephalus, mass lesion, or abnormal extra-axial fluid collection.  No definite CT evidence for acute infarction.  The visualized paranasal sinuses and mastoid air cells are predominately clear.  IMPRESSION: No acute intracranial abnormality.  Original Report Authenticated By: Waneta Martins, M.D.   Ct Angio Chest W/cm &/or Wo Cm  11/22/2011  *RADIOLOGY REPORT*  Clinical Data:  Dizziness.  Hypertension.  Elevated D-dimer level. Leg pain.  CT ANGIOGRAPHY CHEST WITH  CONTRAST  Technique:  Multidetector CT imaging of the chest was performed using the standard protocol during bolus administration of intravenous contrast.  Multiplanar CT image reconstructions including MIPs were obtained to evaluate the vascular anatomy.  Contrast: OMNIPAQUE IOHEXOL 300 MG/ML IV SOLN  Comparison:  Chest radiograph dated 11/22/2011  Findings:  Atherosclerotic calcification of the aortic arch and of the  proximal branch vessels noted.  No mediastinal or pathologic hilar adenopathy noted.  Coronary artery atherosclerotic calcification in the left anterior descending coronary artery is noted.  No filling defect is identified in the pulmonary arterial tree to suggest pulmonary embolus.  No aortic dissection is noted although there is some atherosclerotic calcification and mild intimal thickening in the descending thoracic aorta.  The lungs appear clear.  Mild thoracic spondylosis noted.  Review of the MIP images confirms the above findings.  IMPRESSION:  1.  Atherosclerosis as noted above. 2.  No pulmonary embolus or aortic dissection is observed.  Original Report Authenticated By: Dellia Cloud, M.D.   Mr Thoracic Spine Wo Contrast  11/22/2011  **ADDENDUM** CREATED: 11/22/2011 11:21:15  There was no given history of fall or trauma at the time of initial interpretation however I did receive a call from the hospitalist caring for the patient. Prior radiograph history specifically states no injury.  Patient did have recent fall and subsequent onset of back pain and radicular pain.  This makes the abnormal signal with bone marrow edema at S3 consistent with nondisplaced sacral fracture.  We discussed further evaluation with CT scan (recommended by orthopedics) of the sacrum which would probably not yield additional data based on the history and MRI findings.  Per CMS PQRS reporting requirements (PQRS Measure 24): Given the patient's age of greater than 50 and the fracture site (hip, distal  radius, or spine), the patient should be tested for osteoporosis using DXA, and the appropriate treatment considered based on the DXA results.  **END ADDENDUM** SIGNED BY: Andreas Newport, M.D.   11/22/2011  *RADIOLOGY REPORT*  Clinical Data:  Sudden onset mid to low back pain with left leg weakness.  Claustrophobia.  Focal left leg weakness.  MRI THORACIC AND LUMBAR SPINE WITHOUT CONTRAST  Technique:  Multiplanar and multiecho pulse sequences of the thoracic and lumbar spine were obtained without intravenous contrast.  Comparison:  11/22/2011.  MRI THORACIC SPINE  Findings: Thoracic spinal alignment is anatomic.  Counting was performed from the craniocervical junction.  Thoracic and lumbar vertebral levels are anatomic.  No segmentation abnormalities.  The thoracic cord demonstrates normal caliber and intramedullary signal.  Vertebral body height is preserved.  Marrow signal is normal with exception of a hemangioma or degenerative endplate changes in the inferior aspect of L1.  The paraspinal soft tissues appear within normal limits.  There is no spinal or foraminal stenosis identified.  IMPRESSION: Negative thoracic spine MRI.  MRI LUMBAR SPINE  Findings: The patient was unable to perform sagittal T2-weighted images.  Inversion recovery images were obtained.  Vertebral body height is preserved.  The axial images are motion degraded.  There is a tiny focus of bone marrow edema in the sacrum at S2-S3, suspicious for nondisplaced fracture.  This is not visualized on axial images.  L1-L2:  Mild disc degeneration with anterior bulging.  No stenosis.  L2-L3:  Mild disc degeneration.  No stenosis.  L3-L4:  Negative.  L4-L5:  Circumferential disc bulge.  Mild bilateral foraminal stenosis associated with bulging disc.   Mild degenerative endplate changes with subchondral marrow edema on the right. Central canal and lateral recesses grossly appear patent.  L5-S1:  Shallow circumferential disc bulge.  IMPRESSION: 1.   Motion degraded study.  Repeat images were attempted however the patient continued to move throughout the study and axial images were markedly motion degraded.  2.  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 3.  Suggestion of a nondisplaced sacral fracture at S3 with mild marrow edema is seen on sagittal images. Original Report Authenticated By: Andreas Newport, M.D.    Review of Systems  HENT: Negative.   Eyes: Negative.   Respiratory: Negative.   Cardiovascular: Negative.   Musculoskeletal: Positive for back pain and falls.  Skin: Negative.   Neurological: Positive for dizziness and weakness.   Blood pressure 114/69, pulse 79, temperature 98.3 F (36.8 C), temperature source Oral, resp. rate 18, SpO2 96.00%. Physical Exam patient is conscious alert appropriate with IVs in his arm laying in a hospital emergency room gurney. He is in no obvious distress. He is receiving blood. No shortness of breath. His abdomen is soft and nontender. He is tender with palpation of his lower pelvic sacral and coccygeal region with no instability. Lower extremity is neuromotor vascularly intact  Assessment/Plan: Impression possible nondisplaced S3 sacral fracture. Poor quality MRI but no gross herniated disc Plan at this point patient will be treated with ice packs and cushioned seating weightbearing as tolerated. Pain meds as needed. Further treatment is per medical service for his other medical issues. Recommend followup it [facility] orthopedics in 3-4 weeks for reevaluation. Patient is to call [phone] for that followup appointment. Sooner if his symptoms worsen. At this time orthopedics will sign off. Thank you for the consult  Jamelle Rushing 11/22/2011, 8:01 PM

## 2011-11-22 NOTE — ED Notes (Addendum)
Dr. Cleotis Lema speaking with wife on the phone...wife at bedside.

## 2011-11-22 NOTE — ED Notes (Signed)
Per RN hold off on drawing labs until blood transfusion is complete.

## 2011-11-22 NOTE — ED Notes (Signed)
Dr. Cleotis Lema notified

## 2011-11-22 NOTE — ED Notes (Signed)
Patient transported to X-ray 

## 2011-11-22 NOTE — ED Notes (Signed)
SCD Compression sleeves placed, Bilat.

## 2011-11-22 NOTE — ED Provider Notes (Addendum)
History     CSN: [id number] Arrival date & time: 11/21/2011 10:57 PM   First MD Initiated Contact with Patient 11/22/11 [phone]      Chief Complaint  Patient presents with  . Dizziness    (Consider location/radiation/quality/duration/timing/severity/associated sxs/prior treatment) Patient is a 65 y.o. male presenting with fall. The history is provided by the patient.  Fall The accident occurred 12 to 24 hours ago. The fall occurred while walking. He fell from a height of 1 to 2 ft. He landed on carpet. The pain is present in the left knee (And left lower leg). The pain is mild. He was ambulatory at the scene. There was no entrapment after the fall. There was no drug use involved in the accident. There was no alcohol use involved in the accident. Pertinent negatives include no visual change, no fever, no numbness, no abdominal pain, no bowel incontinence, no nausea, no vomiting, no hematuria, no headaches and no loss of consciousness. Associated symptoms comments: Trouble walking due to dizziness and left leg pain. . The symptoms are aggravated by standing. He has tried nothing for the symptoms.   patient appeared tonight when he was struggling to breathe and told his wife that he needed to go outside. Shortness of breath. Is not ongoing. He has low back pain. It is, unclear. When the pain started. He denies symptoms of compressive myelopathy, including incontinence of bowel or bladder, persistent numbness, persistent leg pain. He has no history of cancer, bleeding disorder or trauma to the spine  Past Medical History  Diagnosis Date  . Hypertension   . High cholesterol     History reviewed. No pertinent past surgical history.  No family history on file.  History  Substance Use Topics  . Smoking status: Never Smoker   . Smokeless tobacco: Not on file  . Alcohol Use: No      Review of Systems  Constitutional: Negative for fever.  Gastrointestinal: Negative for nausea, vomiting,  abdominal pain and bowel incontinence.  Genitourinary: Negative for hematuria.  Neurological: Negative for loss of consciousness, numbness and headaches.  All other systems reviewed and are negative.    Allergies  Review of patient's allergies indicates no known allergies.  Home Medications   Current Outpatient Rx  Name Route Sig Dispense Refill  . ASPIRIN EC 81 MG PO TBEC Oral Take 81 mg by mouth daily.      Marland Kitchen BENAZEPRIL HCL 20 MG PO TABS Oral Take 20 mg by mouth daily.      Marland Kitchen ROSUVASTATIN CALCIUM 20 MG PO TABS Oral Take 20 mg by mouth daily.        BP 112/68  Pulse 82  Temp(Src) 98.4 F (36.9 C) (Oral)  Resp 16  SpO2 100%  Physical Exam  Constitutional: He is oriented to person, place, and time. He appears well-developed and well-nourished. He appears distressed (anxious).  HENT:  Head: Normocephalic and atraumatic.  Eyes: Conjunctivae and EOM are normal. Pupils are equal, round, and reactive to light.  Neck: Normal range of motion. Neck supple.  Cardiovascular: Normal rate and regular rhythm.   Pulmonary/Chest: Effort normal and breath sounds normal. No respiratory distress. He has no wheezes. He has no rales. He exhibits no tenderness.  Abdominal: Soft. Bowel sounds are normal.  Musculoskeletal: Normal range of motion. He exhibits no edema and no tenderness.  Neurological: He is alert and oriented to person, place, and time. No cranial nerve deficit. Coordination normal.  Skin: Skin is warm and dry.  Psychiatric: His behavior is normal.        He is anxious and distracted. He takes time to answer questions and requires redirection of thought processes to do that.    ED Course  Procedures (including critical care time) Reevaluation: 07:09- he is somewhat more comfortable now. Repeat blood pressure after IV fluids 111/79. He did have a period of hypotension earlier, and was orthostatic on formal testing. He states that his left leg still feels abnormal and in both pain  and numb sensation at this time. He feels like the IV fluids have improved his dizziness. Labs Reviewed  CBC - Abnormal; Notable for the following:    WBC 10.9 (*)    RBC 3.02 (*)    Hemoglobin 9.9 (*)    HCT 27.6 (*)    Platelets 138 (*)    All other components within normal limits  COMPREHENSIVE METABOLIC PANEL - Abnormal; Notable for the following:    Glucose, Bld 108 (*)    BUN 39 (*)    Total Protein 5.4 (*)    Albumin 3.1 (*)    Total Bilirubin 0.1 (*)    GFR calc non Af Amer 58 (*)    GFR calc Af Amer 67 (*)    All other components within normal limits  URINALYSIS, ROUTINE W REFLEX MICROSCOPIC - Abnormal; Notable for the following:    APPearance CLOUDY (*)    All other components within normal limits  DIFFERENTIAL  TROPONIN I  ETHANOL  URINE CULTURE   Dg Chest 2 View  11/22/2011  *RADIOLOGY REPORT*  Clinical Data: Weakness, soreness.  CHEST - 2 VIEW  Comparison: None.  Findings: Lungs are clear.  No pleural effusion or pneumothorax. No acute osseous abnormality.  Cardiomediastinal contours within normal limits.  IMPRESSION: No acute process identified.  Original Report Authenticated By: Waneta Martins, M.D.   Dg Lumbar Spine Complete  11/22/2011  *RADIOLOGY REPORT*  Clinical Data: Left leg pain, weakness, soreness.  LUMBAR SPINE - COMPLETE 4+ VIEW  Comparison: None.  Findings: Mild multilevel degenerative changes.  No acute fracture or dislocation identified.  The vertebral body heights and intervertebral disc spaces are maintained.  Advanced atherosclerotic calcification.  SI joints appear intact.  The sacrum is partially obscured by overlying bowel.  IMPRESSION: Mild multilevel degenerative changes.  No acute osseous abnormality identified.  Original Report Authenticated By: Waneta Martins, M.D.   Ct Head Wo Contrast  11/22/2011  *RADIOLOGY REPORT*  Clinical Data: Dizziness, vertigo.  CT HEAD WITHOUT CONTRAST  Technique:  Contiguous axial images were obtained from  the base of the skull through the vertex without contrast.  Comparison: None.  Findings: There is no evidence for acute hemorrhage, hydrocephalus, mass lesion, or abnormal extra-axial fluid collection.  No definite CT evidence for acute infarction.  The visualized paranasal sinuses and mastoid air cells are predominately clear.  IMPRESSION: No acute intracranial abnormality.  Original Report Authenticated By: Waneta Martins, M.D.     1. Dehydration   2. Dizziness   3. Leg pain   4. Anemia      Date: 11/22/2011  Rate: 79  Rhythm: normal sinus rhythm  QRS Axis: normal  Intervals: normal  ST/T Wave abnormalities: normal  Conduction Disutrbances:none  Narrative Interpretation: PVCs  Old EKG Reviewed: none available   MDM  Dizziness with dehydration, no clear cause for fluid loss. He is anemic. His left leg pain is possibly related to the degenerative changes lower back. An MRI has been ordered  to rule out a compressive myelopathy, although I feel it is unlikely based on the clinical exam. I doubt CVA since his dizziness improved with the IV fluids.        Flint Melter, MD 11/22/11 1610  Flint Melter, MD 11/22/11 [id number]

## 2011-11-23 ENCOUNTER — Encounter (HOSPITAL_COMMUNITY)
Admission: EM | Disposition: A | Discharge status: Home / Self Care | Payer: Self-pay | Source: Home / Self Care | Attending: Internal Medicine

## 2011-11-23 ENCOUNTER — Encounter (HOSPITAL_COMMUNITY): Payer: Self-pay | Admitting: Gastroenterology

## 2011-11-23 ENCOUNTER — Other Ambulatory Visit: Payer: Self-pay | Admitting: Gastroenterology

## 2011-11-23 DIAGNOSIS — D649 Anemia, unspecified: Secondary | ICD-10-CM

## 2011-11-23 DIAGNOSIS — K922 Gastrointestinal hemorrhage, unspecified: Secondary | ICD-10-CM

## 2011-11-23 DIAGNOSIS — K269 Duodenal ulcer, unspecified as acute or chronic, without hemorrhage or perforation: Secondary | ICD-10-CM

## 2011-11-23 HISTORY — PX: ESOPHAGOGASTRODUODENOSCOPY: SHX5428

## 2011-11-23 HISTORY — PX: UPPER GASTROINTESTINAL ENDOSCOPY: SHX188

## 2011-11-23 LAB — TYPE AND SCREEN
ABO/RH(D): A POS
Antibody Screen: NEGATIVE
Unit division: 0
Unit division: 0

## 2011-11-23 LAB — CBC
HCT: 29.5 % — ABNORMAL LOW (ref 39.0–52.0)
MCH: 32 pg (ref 26.0–34.0)
MCV: 89.9 fL (ref 78.0–100.0)
RDW: 13.6 % (ref 11.5–15.5)
WBC: 5.8 10*3/uL (ref 4.0–10.5)

## 2011-11-23 LAB — BASIC METABOLIC PANEL
CO2: 27 mEq/L (ref 19–32)
Chloride: 108 mEq/L (ref 96–112)
Creatinine, Ser: 1.37 mg/dL — ABNORMAL HIGH (ref 0.50–1.35)
Glucose, Bld: 89 mg/dL (ref 70–99)

## 2011-11-23 LAB — HEMOGLOBIN AND HEMATOCRIT, BLOOD
HCT: 29.9 % — ABNORMAL LOW (ref 39.0–52.0)
HCT: 29.8 % — ABNORMAL LOW (ref 39.0–52.0)
Hemoglobin: 10.7 g/dL — ABNORMAL LOW (ref 13.0–17.0)
Hemoglobin: 10.5 g/dL — ABNORMAL LOW (ref 13.0–17.0)

## 2011-11-23 LAB — IRON AND TIBC
Iron: 230 ug/dL — ABNORMAL HIGH (ref 42–135)
UIBC: <15 ug/dL — ABNORMAL LOW (ref 125–400)

## 2011-11-23 LAB — FOLATE: Folate: 6.8 ng/mL

## 2011-11-23 LAB — CARDIAC PANEL(CRET KIN+CKTOT+MB+TROPI)
CK, MB: 2.1 ng/mL (ref 0.3–4.0)
CK, MB: 2 ng/mL (ref 0.3–4.0)
Troponin I: <0.3 ng/mL (ref <0.30)

## 2011-11-23 LAB — RETICULOCYTES: Retic Count, Absolute: 55.8 10*3/uL (ref 19.0–186.0)

## 2011-11-23 LAB — URINE CULTURE

## 2011-11-23 SURGERY — EGD (ESOPHAGOGASTRODUODENOSCOPY)
Anesthesia: Moderate Sedation

## 2011-11-23 MED ORDER — PNEUMOCOCCAL VAC POLYVALENT 25 MCG/0.5ML IJ INJ
0.5000 mL | INJECTION | INTRAMUSCULAR | Status: AC
  Administered 2011-11-24: 0.5 mL via INTRAMUSCULAR
  Filled 2011-11-23 (×2): qty 0.5

## 2011-11-23 MED ORDER — PANTOPRAZOLE SODIUM 40 MG PO TBEC: 40.0000 mg | DELAYED_RELEASE_TABLET | Freq: Two times a day (BID) | ORAL | Status: DC

## 2011-11-23 MED ORDER — PANTOPRAZOLE SODIUM 40 MG PO TBEC
40.0000 mg | DELAYED_RELEASE_TABLET | Freq: Two times a day (BID) | ORAL | Status: DC
  Filled 2011-11-23: qty 1

## 2011-11-23 MED ORDER — MIDAZOLAM HCL 10 MG/2ML IJ SOLN
INTRAMUSCULAR | Status: DC | PRN
  Administered 2011-11-23 (×2): 2 mg via INTRAVENOUS
  Administered 2011-11-23: 1 mg via INTRAVENOUS

## 2011-11-23 MED ORDER — DEXTROSE-NACL 5-0.9 % IV SOLN: INTRAVENOUS | Status: DC

## 2011-11-23 MED ORDER — MORPHINE SULFATE 2 MG/ML IJ SOLN: 2.0000 mg | INTRAMUSCULAR | Status: DC | PRN

## 2011-11-23 MED ORDER — BUTAMBEN-TETRACAINE-BENZOCAINE 2-2-14 % EX AERO
INHALATION_SPRAY | CUTANEOUS | Status: DC | PRN
  Administered 2011-11-23: 2 via TOPICAL

## 2011-11-23 MED ORDER — FENTANYL NICU IV SYRINGE 50 MCG/ML
INJECTION | INTRAMUSCULAR | Status: DC | PRN
  Administered 2011-11-23 (×2): 25 ug via INTRAVENOUS
  Administered 2011-11-23: 12.5 ug via INTRAVENOUS

## 2011-11-23 NOTE — Progress Notes (Signed)
Jack Taylor is a 65 y.o. male patient on baby asa at home, who came in with dizziness/sob/episode of hematochezia. Patient being investigated for source of bleed. Appreciate GI input. Hb/hct has been stable.  SUBJECTIVE Feels weak. Wants to eat.   1. Dehydration   2. Dizziness   3. Leg pain   4. Anemia     Past Medical History  Diagnosis Date  . Hypertension   . High cholesterol    Current Facility-Administered Medications  Medication Dose Route Frequency Provider Last Rate Last Dose  . acetaminophen (TYLENOL) tablet 650 mg  650 mg Oral Q6H PRN Sosan Abdullah       Or  . acetaminophen (TYLENOL) suppository 650 mg  650 mg Rectal Q6H PRN Sosan Abdullah      . dextrose 5 %-0.9 % sodium chloride infusion   Intravenous Continuous Abigal Choung      . HYDROmorphone (DILAUDID) injection 1 mg  1 mg Intravenous Q4H PRN Sosan Abdullah      . iohexol (OMNIPAQUE) 300 MG/ML solution 100 mL  100 mL Intravenous Once PRN Medication Radiologist   100 mL at 11/22/11 1641  . LORazepam (ATIVAN) 2 MG/ML injection           . LORazepam (ATIVAN) injection 1 mg  1 mg Intravenous Once Sosan Abdullah   1 mg at 11/22/11 0910  . ondansetron (ZOFRAN) tablet 4 mg  4 mg Oral Q6H PRN Sosan Abdullah       Or  . ondansetron (ZOFRAN) injection 4 mg  4 mg Intravenous Q6H PRN Sosan Abdullah      . oxyCODONE (Oxy IR/ROXICODONE) immediate release tablet 5 mg  5 mg Oral Q4H PRN Sosan Abdullah      . pantoprazole (PROTONIX) 80 mg in sodium chloride 0.9 % 250 mL infusion  8 mg/hr Intravenous Continuous Meredith Pel, NP 25 mL/hr at 11/22/11 2144 8 mg/hr at 11/22/11 2144  . rosuvastatin (CRESTOR) tablet 20 mg  20 mg Oral Daily Sosan Abdullah      . senna (SENOKOT) tablet 8.6 mg  1 tablet Oral BID Sosan Abdullah      . sodium chloride 0.9 % bolus 500 mL  500 mL Intravenous Once Flint Melter, MD      . DISCONTD: 0.45 % sodium chloride infusion   Intravenous Once Meredith Pel, NP      . DISCONTD: 0.9 %  sodium  chloride infusion   Intravenous Continuous Flint Melter, MD 125 mL/hr at 11/22/11 1158    . DISCONTD: 0.9 %  sodium chloride infusion   Intravenous Continuous Sosan Abdullah      . DISCONTD: aspirin EC tablet 81 mg  81 mg Oral Daily Sosan Abdullah      . DISCONTD: enoxaparin (LOVENOX) injection 40 mg  40 mg Subcutaneous Q24H Sosan Abdullah   40 mg at 11/22/11 1354   Current Outpatient Prescriptions  Medication Sig Dispense Refill  . aspirin EC 81 MG tablet Take 81 mg by mouth daily.        . benazepril (LOTENSIN) 20 MG tablet Take 20 mg by mouth daily.        . rosuvastatin (CRESTOR) 20 MG tablet Take 20 mg by mouth daily.         No Known Allergies Principal Problem:  *Orthostasis Active Problems:  Dizziness  Back pain  Dehydration  Hypertension  Leg pain  Fall   Vital signs in last 24 hours: Temp:  [98 F (36.7 C)-98.6 F (37 C)]  98 F (36.7 C) (12/19 0809) Pulse Rate:  [64-88] 75  (12/19 0809) Resp:  [14-22] 20  (12/19 0809) BP: (99-120)/(43-84) 116/65 mmHg (12/19 0809) SpO2:  [90 %-100 %] 98 % (12/19 0809) Weight change:     Intake/Output from previous day: 12/18 0701 - 12/19 0700 In: 932.5 [Blood:932.5] Out: -  Intake/Output this shift:    Lab Results:  Basename 11/23/11 0748 11/23/11 0450 11/22/11 1710  WBC -- 5.8 6.8  HGB 10.5* 10.5* --  HCT 29.8* 29.5* --  PLT -- 110* 122*   BMET  Basename 11/23/11 0450 11/22/11 1710 11/22/11 0400  NA 140 -- 142  K 4.2 -- 4.2  CL 108 -- 110  CO2 27 -- 26  GLUCOSE 89 -- 108*  BUN 24* -- 39*  CREATININE 1.37* 1.28 --  CALCIUM 8.6 -- 8.7    Studies/Results: Dg Chest 2 View  11/22/2011  *RADIOLOGY REPORT*  Clinical Data: Weakness, soreness.  CHEST - 2 VIEW  Comparison: None.  Findings: Lungs are clear.  No pleural effusion or pneumothorax. No acute osseous abnormality.  Cardiomediastinal contours within normal limits.  IMPRESSION: No acute process identified.  Original Report Authenticated By: Waneta Martins, M.D.   Dg Lumbar Spine Complete  11/22/2011  *RADIOLOGY REPORT*  Clinical Data: Left leg pain, weakness, soreness.  LUMBAR SPINE - COMPLETE 4+ VIEW  Comparison: None.  Findings: Mild multilevel degenerative changes.  No acute fracture or dislocation identified.  The vertebral body heights and intervertebral disc spaces are maintained.  Advanced atherosclerotic calcification.  SI joints appear intact.  The sacrum is partially obscured by overlying bowel.  IMPRESSION: Mild multilevel degenerative changes.  No acute osseous abnormality identified.  Original Report Authenticated By: Waneta Martins, M.D.   Ct Head Wo Contrast  11/22/2011  *RADIOLOGY REPORT*  Clinical Data: Dizziness, vertigo.  CT HEAD WITHOUT CONTRAST  Technique:  Contiguous axial images were obtained from the base of the skull through the vertex without contrast.  Comparison: None.  Findings: There is no evidence for acute hemorrhage, hydrocephalus, mass lesion, or abnormal extra-axial fluid collection.  No definite CT evidence for acute infarction.  The visualized paranasal sinuses and mastoid air cells are predominately clear.  IMPRESSION: No acute intracranial abnormality.  Original Report Authenticated By: Waneta Martins, M.D.   Ct Angio Chest W/cm &/or Wo Cm  11/22/2011  *RADIOLOGY REPORT*  Clinical Data:  Dizziness.  Hypertension.  Elevated D-dimer level. Leg pain.  CT ANGIOGRAPHY CHEST WITH CONTRAST  Technique:  Multidetector CT imaging of the chest was performed using the standard protocol during bolus administration of intravenous contrast.  Multiplanar CT image reconstructions including MIPs were obtained to evaluate the vascular anatomy.  Contrast: OMNIPAQUE IOHEXOL 300 MG/ML IV SOLN  Comparison:  Chest radiograph dated 11/22/2011  Findings:  Atherosclerotic calcification of the aortic arch and of the proximal branch vessels noted.  No mediastinal or pathologic hilar adenopathy noted.  Coronary artery  atherosclerotic calcification in the left anterior descending coronary artery is noted.  No filling defect is identified in the pulmonary arterial tree to suggest pulmonary embolus.  No aortic dissection is noted although there is some atherosclerotic calcification and mild intimal thickening in the descending thoracic aorta.  The lungs appear clear.  Mild thoracic spondylosis noted.  Review of the MIP images confirms the above findings.  IMPRESSION:  1.  Atherosclerosis as noted above. 2.  No pulmonary embolus or aortic dissection is observed.  Original Report Authenticated By: Dellia Cloud,  M.D.   Mr Thoracic Spine Wo Contrast  11/22/2011  **ADDENDUM** CREATED: 11/22/2011 11:21:15  There was no given history of fall or trauma at the time of initial interpretation however I did receive a call from the hospitalist caring for the patient. Prior radiograph history specifically states no injury.  Patient did have recent fall and subsequent onset of back pain and radicular pain.  This makes the abnormal signal with bone marrow edema at S3 consistent with nondisplaced sacral fracture.  We discussed further evaluation with CT scan (recommended by orthopedics) of the sacrum which would probably not yield additional data based on the history and MRI findings.  Per CMS PQRS reporting requirements (PQRS Measure 24): Given the patient's age of greater than 50 and the fracture site (hip, distal radius, or spine), the patient should be tested for osteoporosis using DXA, and the appropriate treatment considered based on the DXA results.  **END ADDENDUM** SIGNED BY: Andreas Newport, M.D.   11/22/2011  *RADIOLOGY REPORT*  Clinical Data:  Sudden onset mid to low back pain with left leg weakness.  Claustrophobia.  Focal left leg weakness.  MRI THORACIC AND LUMBAR SPINE WITHOUT CONTRAST  Technique:  Multiplanar and multiecho pulse sequences of the thoracic and lumbar spine were obtained without intravenous contrast.   Comparison:  11/22/2011.  MRI THORACIC SPINE  Findings: Thoracic spinal alignment is anatomic.  Counting was performed from the craniocervical junction.  Thoracic and lumbar vertebral levels are anatomic.  No segmentation abnormalities.  The thoracic cord demonstrates normal caliber and intramedullary signal.  Vertebral body height is preserved.  Marrow signal is normal with exception of a hemangioma or degenerative endplate changes in the inferior aspect of L1.  The paraspinal soft tissues appear within normal limits.  There is no spinal or foraminal stenosis identified.  IMPRESSION: Negative thoracic spine MRI.  MRI LUMBAR SPINE  Findings: The patient was unable to perform sagittal T2-weighted images.  Inversion recovery images were obtained.  Vertebral body height is preserved.  The axial images are motion degraded.  There is a tiny focus of bone marrow edema in the sacrum at S2-S3, suspicious for nondisplaced fracture.  This is not visualized on axial images.  L1-L2:  Mild disc degeneration with anterior bulging.  No stenosis.  L2-L3:  Mild disc degeneration.  No stenosis.  L3-L4:  Negative.  L4-L5:  Circumferential disc bulge.  Mild bilateral foraminal stenosis associated with bulging disc.   Mild degenerative endplate changes with subchondral marrow edema on the right. Central canal and lateral recesses grossly appear patent.  L5-S1:  Shallow circumferential disc bulge.  IMPRESSION: 1.  Motion degraded study.  Repeat images were attempted however the patient continued to move throughout the study and axial images were markedly motion degraded.  2.  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 3.  Suggestion of a nondisplaced sacral fracture at S3 with mild marrow edema is seen on sagittal images. Original Report Authenticated By: Andreas Newport, M.D.    Medications: I have reviewed the patient's current medications.   Physical exam GENERAL- alert  and unwell HEAD- normal atraumatic, no neck masses, normal thyroid, no jvd RESPIRATORY- appears well, vitals normal, no respiratory distress, acyanotic, normal RR, ear and throat exam is normal, neck free of mass or lymphadenopathy, chest clear, no wheezing, crepitations, rhonchi, normal symmetric air entry CVS- regular rate and rhythm, S1, S2 normal, no murmur, click, rub or gallop ABDOMEN- abdomen is soft without significant tenderness, masses,  organomegaly or guarding NEURO- Grossly normal EXTREMITIES- extremities normal, atraumatic, no cyanosis or edema  Plan   *Orthostasis/ Dizziness/Rectal bleed- hemodynamically stable. Hb stable. For EGD today. Change IVF to d5w/ns/since hb stable change cbc monitoring to daily. Continue ppi  *Hypertension- currently normotensive off meds.  * Leg pain/ Fall/nondisplaced s3 fracture- ortho appreciated. Follow recommendations.  *condition closely guarded.    Rogue Rafalski 11/23/2011 8:49 AM Pager: [phone].

## 2011-11-23 NOTE — Op Note (Signed)
[facility] [address] [place], [place]  [address]  ENDOSCOPY PROCEDURE REPORT  PATIENT:  Jack Taylor, Jack Taylor  MR#:  [id number] BIRTHDATE:  03-02-1946, 65 yrs. old  GENDER:  male ENDOSCOPIST:  Rachael Fee, MD PROCEDURE DATE:  11/23/2011 PROCEDURE:  EGD with biopsy, 43239 ASA CLASS:  Class II INDICATIONS:  melena, elevated BUN, anemia; +ASA daily MEDICATIONS:  Fentanyl 62.5 mcg IV, Versed 5 mg IV TOPICAL ANESTHETIC:  Cetacaine Spray  DESCRIPTION OF PROCEDURE:   After the risks benefits and alternatives of the procedure were thoroughly explained, informed consent was obtained.  The Pentax Gastroscope [id number] endoscope was introduced through the mouth and advanced to the second portion of the duodenum, without limitations.  The instrument was slowly withdrawn as the mucosa was fully examined. <<PROCEDUREIMAGES>> Mild gastritis was found. This was biopsied and sent to pathology (jar 1) (see image1).  There was bulbar duodenitis with inflammation, 2-3 small erosions and a 5mm clean based ulcer in bulb. The mucosa surrounding the ulcer was heaped up, inflammed, edematous appearing (see image8, image2, and image3).  Otherwise the examination was normal (see image4, image9, and image6). Retroflexed views revealed no abnormalities.    The scope was then withdrawn from the patient and the procedure completed.  COMPLICATIONS:  None  ENDOSCOPIC IMPRESSION: 1) Mild gastritis, biopied to check for H. pyori 2) Duodenitis (inflammation, erosions and  5mm clean based ulcer) 3) Otherwise normal examination  RECOMMENDATIONS: OK to changed to twice daily PPI (oral) and advance diet as tolerated. Should observe another 24 hours and then probably OK to go home if no recurrent bleeding from a GI perspective. Biopsies will take 1-2 days to return, I will contact him with the results and will start appropriate antibiotics if needed.  ______________________________ Rachael Fee,  MD  n. eSIGNED:   Rachael Fee at 11/23/2011 11:26 AM  Mack Guise, [phone]

## 2011-11-23 NOTE — ED Notes (Signed)
JXB:[id number]<GN> Expected date:11/23/11<BR> Expected time: 8:54 AM<BR> Means of arrival:Other<BR> Comments:<BR> closed

## 2011-11-23 NOTE — ED Notes (Signed)
Vital signs stable. 

## 2011-11-23 NOTE — Progress Notes (Signed)
Pt is requesting, "real food" for dinner tonight.  He states he tolerated his clear liquid meal after his procedure today before arriving to this unit.  He denies and abdominal or GI symptoms at this time.  Cleared with attending, but he would like to clear with GI as well.  MD on call for GI notified and orders given.

## 2011-11-23 NOTE — ED Notes (Signed)
Family at bedside. 

## 2011-11-23 NOTE — ED Notes (Signed)
Patient denies pain and is resting comfortably.  

## 2011-11-23 NOTE — Interval H&P Note (Signed)
History and Physical Interval Note:  11/23/2011 9:23 AM  Jack Taylor  has presented today for surgery, with the diagnosis of Gastrointestinal bleeding  The various methods of treatment have been discussed with the patient and family. After consideration of risks, benefits and other options for treatment, the patient has consented to  Procedure(s): ESOPHAGOGASTRODUODENOSCOPY (EGD) as a surgical intervention .  The patients' history has been reviewed, patient examined, no change in status, stable for surgery.  I have reviewed the patients' chart and labs.  Questions were answered to the patient's satisfaction.     Rob Bunting

## 2011-11-24 ENCOUNTER — Encounter (HOSPITAL_COMMUNITY): Payer: Self-pay | Admitting: Gastroenterology

## 2011-11-24 DIAGNOSIS — K269 Duodenal ulcer, unspecified as acute or chronic, without hemorrhage or perforation: Secondary | ICD-10-CM | POA: Diagnosis present

## 2011-11-24 DIAGNOSIS — I959 Hypotension, unspecified: Secondary | ICD-10-CM

## 2011-11-24 DIAGNOSIS — D62 Acute posthemorrhagic anemia: Secondary | ICD-10-CM | POA: Diagnosis present

## 2011-11-24 LAB — CBC
HCT: 29.9 % — ABNORMAL LOW (ref 39.0–52.0)
Hemoglobin: 10.5 g/dL — ABNORMAL LOW (ref 13.0–17.0)
MCH: 31.4 pg (ref 26.0–34.0)
MCHC: 35.1 g/dL (ref 30.0–36.0)
MCV: 89.5 fL (ref 78.0–100.0)

## 2011-11-24 LAB — MAGNESIUM: Magnesium: 2.1 mg/dL (ref 1.5–2.5)

## 2011-11-24 LAB — COMPREHENSIVE METABOLIC PANEL
Alkaline Phosphatase: 60 U/L (ref 39–117)
BUN: 21 mg/dL (ref 6–23)
Calcium: 8.7 mg/dL (ref 8.4–10.5)
Creatinine, Ser: 1.36 mg/dL — ABNORMAL HIGH (ref 0.50–1.35)
GFR calc Af Amer: 61 mL/min — ABNORMAL LOW (ref >90)
Glucose, Bld: 148 mg/dL — ABNORMAL HIGH (ref 70–99)
Total Protein: 5.7 g/dL — ABNORMAL LOW (ref 6.0–8.3)

## 2011-11-24 LAB — PROTIME-INR: INR: 0.88 (ref 0.00–1.49)

## 2011-11-24 MED ORDER — PANTOPRAZOLE SODIUM 40 MG PO TBEC: 40.0000 mg | DELAYED_RELEASE_TABLET | Freq: Two times a day (BID) | ORAL | Status: DC

## 2011-11-24 MED ORDER — ASPIRIN EC 81 MG PO TBEC: 81.0000 mg | DELAYED_RELEASE_TABLET | Freq: Every day | ORAL | Status: DC

## 2011-11-24 NOTE — Progress Notes (Signed)
UR COMPLETED  

## 2011-11-24 NOTE — Discharge Summary (Signed)
DISCHARGE SUMMARY  Jack Taylor  MR#: [id number]  DOB:07-30-1946  Date of Admission: 11/21/2011 Date of Discharge: 11/24/2011  Attending Physician:Edwardo Wojnarowski  Patient's AOZ:[id number],NGEXB W, MD  Consults:  Rob Bunting, MD.  Discharge Diagnoses: Present on Admission:  .Dizziness .Back pain .Orthostasis .Dehydration .Leg pain .Fall .Duodenal ulcer .Anemia associated with acute blood loss    Current Discharge Medication List    START taking these medications   Details  pantoprazole (PROTONIX) 40 MG tablet Take 1 tablet (40 mg total) by mouth 2 (two) times daily before a meal. Qty: 60 tablet, Refills: 1      CONTINUE these medications which have CHANGED   Details  aspirin EC 81 MG tablet Take 1 tablet (81 mg total) by mouth daily. Qty: 30 tablet, Refills: 0      CONTINUE these medications which have NOT CHANGED   Details  benazepril (LOTENSIN) 20 MG tablet Take 20 mg by mouth daily.      rosuvastatin (CRESTOR) 20 MG tablet Take 20 mg by mouth daily.            Hospital Course: MR Broughton was admitted with dizziness and a fall, associated with passing black tarry stool. He subsequently had EGD with findings of duodenal ulcer, likely source of bleeding. Dr Christella Hartigan recommended ppi bid dosing for a month then daily dosing while patient on aspirin. He can resume aspirin in a week's time. Patient's hb was 8.7 g/dl on admission, it is 28.4 g/dl today. He has not required prbc transfusion. Biopsies were sent during egd and Dr Christella Hartigan will call patient with results. Creatinine was 1.36 at d/c. This was need follow up in the outpatient setting ?cause- dehydration vs other causes. Patient on ACEI. Consider d/c it if worsening renal function. Otherwise patient discharged in stable condition today.   Day of Discharge BP 125/70  Pulse 61  Temp(Src) 98.7 F (37.1 C) (Oral)  Resp 16  Ht 5' 8.5" (1.74 m)  Wt 75.6 kg (166 lb 10.7 oz)  BMI 24.97 kg/m2  SpO2 95%  Physical  Exam: At baseline.  Results for orders placed during the hospital encounter of 11/21/11 (from the past 24 hour(s))  CBC     Status: Abnormal   Collection Time   11/24/11  3:15 AM      Component Value Range   WBC 7.3  4.0 - 10.5 (K/uL)   RBC 3.34 (*) 4.22 - 5.81 (MIL/uL)   Hemoglobin 10.5 (*) 13.0 - 17.0 (g/dL)   HCT 13.2 (*) 44.0 - 52.0 (%)   MCV 89.5  78.0 - 100.0 (fL)   MCH 31.4  26.0 - 34.0 (pg)   MCHC 35.1  30.0 - 36.0 (g/dL)   RDW 10.2  72.5 - 36.6 (%)   Platelets 121 (*) 150 - 400 (K/uL)  COMPREHENSIVE METABOLIC PANEL     Status: Abnormal   Collection Time   11/24/11  3:15 AM      Component Value Range   Sodium 138  135 - 145 (mEq/L)   Potassium 3.7  3.5 - 5.1 (mEq/L)   Chloride 104  96 - 112 (mEq/L)   CO2 26  19 - 32 (mEq/L)   Glucose, Bld 148 (*) 70 - 99 (mg/dL)   BUN 21  6 - 23 (mg/dL)   Creatinine, Ser 4.40 (*) 0.50 - 1.35 (mg/dL)   Calcium 8.7  8.4 - 34.7 (mg/dL)   Total Protein 5.7 (*) 6.0 - 8.3 (g/dL)   Albumin 3.0 (*) 3.5 - 5.2 (g/dL)  AST 14  0 - 37 (U/L)   ALT 13  0 - 53 (U/L)   Alkaline Phosphatase 60  39 - 117 (U/L)   Total Bilirubin 0.2 (*) 0.3 - 1.2 (mg/dL)   GFR calc non Af Amer 53 (*) >90 (mL/min)   GFR calc Af Amer 61 (*) >90 (mL/min)  MAGNESIUM     Status: Normal   Collection Time   11/24/11  3:15 AM      Component Value Range   Magnesium 2.1  1.5 - 2.5 (mg/dL)  PHOSPHORUS     Status: Normal   Collection Time   11/24/11  3:15 AM      Component Value Range   Phosphorus 4.2  2.3 - 4.6 (mg/dL)  PROTIME-INR     Status: Normal   Collection Time   11/24/11  3:15 AM      Component Value Range   Prothrombin Time 12.1  11.6 - 15.2 (seconds)   INR 0.88  0.00 - 1.49     Disposition: home.   Follow-up Appts: Discharge Orders    Future Orders Please Complete By Expires   Diet - low sodium heart healthy      Increase activity slowly      Discharge instructions      Comments:   Resume aspirin after 1 week.      Follow-up with Dr. Rachel Bo  in 1-2 weeks.   Tests Needing Follow-up: Cbc/bmp. Time spent  For d/c prep 20 minutes. Signed: Josyah Achor 11/24/2011, 3:45 PM

## 2011-11-24 NOTE — Progress Notes (Signed)
Subjective: EGD yesterday with duodenal bulb ulcer, erosions and inflammation.  Good bump in Hb.  Eating well without n/v, or pains.  Had dark stool yesterday, none since.  He wants to go home  Objective: Vital signs in last 24 hours: Temp:  [97.8 F (36.6 C)-98.4 F (36.9 C)] 98.3 F (36.8 C) (12/20 0514) Pulse Rate:  [56-77] 59  (12/20 0514) Resp:  [8-23] 16  (12/20 0514) BP: (92-134)/(46-73) 123/69 mmHg (12/20 0514) SpO2:  [94 %-98 %] 95 % (12/20 0514) Weight:  [75.6 kg (166 lb 10.7 oz)] 166 lb 10.7 oz (75.6 kg) (12/19 1652) Last BM Date: 11/23/11 General: alert and oriented times 3 Heart: regular rate and rythm Abdomen: soft, non-tender, non-distended, normal bowel sounds   Lab Results:  Basename 11/24/11 0315 11/23/11 0748 11/23/11 0450 11/22/11 1710  WBC 7.3 -- 5.8 6.8  HGB 10.5* 10.5* 10.5* --  PLT 121* -- 110* 122*  MCV 89.5 -- 89.9 92.6   BMET  Basename 11/24/11 0315 11/23/11 0450 11/22/11 1710 11/22/11 0400  NA 138 140 -- 142  K 3.7 4.2 -- 4.2  CL 104 108 -- 110  CO2 26 27 -- 26  GLUCOSE 148* 89 -- 108*  BUN 21 24* -- 39*  CREATININE 1.36* 1.37* 1.28 --  CALCIUM 8.7 8.6 -- 8.7   LFT  Basename 11/24/11 0315 11/22/11 0400  PROT 5.7* 5.4*  ALBUMIN 3.0* 3.1*  AST 14 12  ALT 13 16  ALKPHOS 60 51  BILITOT 0.2* 0.1*  BILIDIR -- --  IBILI -- --   PT/INR  Basename 11/24/11 0315 11/22/11 1710  INR 0.88 1.10    Assessment/Plan: 65 y.o. male ugi bleeding  Bleeding seems to have stopped.  He is safe for d/c home today on twice daily PPI for one month then he can decrease to once daily PPI for as long as he is taking ASA.  He can restart ASA in 5 days.  I will contact him with the results of the gastric biopsies, will start him on abx if appropriate.   Rob Bunting, MD  11/24/2011, 9:36 AM [facility] Pager [phone]

## 2012-01-23 ENCOUNTER — Other Ambulatory Visit: Payer: Self-pay | Admitting: Internal Medicine

## 2012-03-23 NOTE — Consult Note (Signed)
I have seen and examined this patient.  Agree with the note above.  Jack Taylor ANDREW 03/23/2012 6:11 PM   

## 2012-08-09 ENCOUNTER — Other Ambulatory Visit: Payer: Self-pay | Admitting: Family Medicine

## 2012-08-09 ENCOUNTER — Ambulatory Visit
Admission: RE | Admit: 2012-08-09 | Discharge: 2012-08-09 | Disposition: A | Discharge status: Home / Self Care | Payer: BC Managed Care – PPO | Source: Ambulatory Visit | Attending: Family Medicine | Admitting: Family Medicine

## 2012-08-09 DIAGNOSIS — R52 Pain, unspecified: Secondary | ICD-10-CM

## 2012-08-09 DIAGNOSIS — R609 Edema, unspecified: Secondary | ICD-10-CM

## 2013-12-09 ENCOUNTER — Encounter: Payer: Self-pay | Admitting: Gastroenterology

## 2013-12-23 ENCOUNTER — Ambulatory Visit (AMBULATORY_SURGERY_CENTER): Payer: Self-pay

## 2013-12-23 VITALS — Ht 68.0 in | Wt 165.0 lb

## 2013-12-23 DIAGNOSIS — Z860100 Personal history of colon polyps, unspecified: Secondary | ICD-10-CM

## 2013-12-23 MED ORDER — MOVIPREP 100 G PO SOLR
1.0000 | Freq: Once | ORAL | Status: DC
Start: 2013-12-23 — End: 2014-01-06

## 2013-12-24 ENCOUNTER — Encounter: Payer: Self-pay | Admitting: Gastroenterology

## 2014-01-06 ENCOUNTER — Ambulatory Visit (AMBULATORY_SURGERY_CENTER): Payer: BC Managed Care – PPO | Admitting: Gastroenterology

## 2014-01-06 ENCOUNTER — Encounter: Payer: Self-pay | Admitting: Gastroenterology

## 2014-01-06 VITALS — BP 141/83 | HR 57 | Temp 96.5°F | Resp 20 | Ht 68.0 in | Wt 165.0 lb

## 2014-01-06 DIAGNOSIS — D126 Benign neoplasm of colon, unspecified: Secondary | ICD-10-CM

## 2014-01-06 DIAGNOSIS — Z86010 Personal history of colonic polyps: Secondary | ICD-10-CM

## 2014-01-06 DIAGNOSIS — K5730 Diverticulosis of large intestine without perforation or abscess without bleeding: Secondary | ICD-10-CM

## 2014-01-06 MED ORDER — SODIUM CHLORIDE 0.9 % IV SOLN: 500.0000 mL | INTRAVENOUS | Status: DC

## 2014-01-06 NOTE — Op Note (Signed)
[place]  [address]. [place], [address]   COLONOSCOPY PROCEDURE REPORT  PATIENT: Jack Taylor, Jack Taylor  MR#: [id number] BIRTHDATE: 1946/07/13 , 20  yrs. old GENDER: Male ENDOSCOPIST: Milus Banister, MD REFERRED VH:QIONG Tollie Pizza, M.D. PROCEDURE DATE:  01/06/2014 PROCEDURE:   Colonoscopy with snare polypectomy First Screening Colonoscopy - Avg.  risk and is 50 yrs.  old or older - No.  Prior Negative Screening - Now for repeat screening. N/A  History of Adenoma - Now for follow-up colonoscopy & has been > or = to 3 yrs.  N/A  Polyps Removed Today? Yes. ASA CLASS:   Class II INDICATIONS:previous colon polyps (6 years ago per patient, ? pathology). MEDICATIONS: Fentanyl 50 mcg IV, Versed 6 mg IV, and These medications were titrated to patient response per physician's verbal order  DESCRIPTION OF PROCEDURE:   After the risks benefits and alternatives of the procedure were thoroughly explained, informed consent was obtained.  A digital rectal exam revealed no abnormalities of the rectum.   The [id number]  endoscope was introduced through the anus and advanced to the cecum, which was identified by both the appendix and ileocecal valve. No adverse events experienced.   The quality of the prep was good.  The instrument was then slowly withdrawn as the colon was fully examined.  COLON FINDINGS: Four polyps were found, removed and sent to pathology.  These were all sessile, located in ascending, descending and sigmoid segments, ranging in size from 61mm to 70mm, all were removed with cold snare.  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  The examination was otherwise normal. Retroflexed views revealed no abnormalities. The time to cecum=3 minutes 01 seconds.  Withdrawal time=9 minutes 47 seconds.  The scope was withdrawn and the procedure completed. COMPLICATIONS: There were no complications.  ENDOSCOPIC IMPRESSION: Four polyps were found,  removed and sent to pathology. 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 The examination was otherwise normal.  RECOMMENDATIONS: If the polyp(s) removed today are proven to be adenomatous (pre-cancerous) polyps, you will need a colonoscopy in 3-5 years. Otherwise you should continue to follow colorectal cancer screening guidelines for "routine risk" patients with a colonoscopy in 10 years.  You will receive a letter within 1-2 weeks with the results of your biopsy as well as final recommendations.  Please call my office if you have not received a letter after 3 weeks.   eSigned:  Milus Banister, MD 01/06/2014 2:04 PM

## 2014-01-06 NOTE — Patient Instructions (Signed)

## 2014-01-06 NOTE — Progress Notes (Signed)
Patient did not experience any of the following events: a burn prior to discharge; a fall within the facility; wrong site/side/patient/procedure/implant event; or a hospital transfer or hospital admission upon discharge from the facility. (G8907) Patient did not have preoperative order for IV antibiotic SSI prophylaxis. (G8918)  

## 2014-01-07 ENCOUNTER — Telehealth: Payer: Self-pay

## 2014-01-07 NOTE — Telephone Encounter (Signed)
  Follow up Call-  Call back number 01/06/2014  Post procedure Call Back phone  # [phone]  Permission to leave phone message Yes     Patient questions:  Do you have a fever, pain , or abdominal swelling? no Pain Score  0 *  Have you tolerated food without any problems? yes  Have you been able to return to your normal activities? yes  Do you have any questions about your discharge instructions: Diet   no Medications  no Follow up visit  no  Do you have questions or concerns about your Care? no  Actions: * If pain score is 4 or above: No action needed, pain <4.

## 2014-01-14 ENCOUNTER — Encounter: Payer: Self-pay | Admitting: Gastroenterology

## 2015-08-27 ENCOUNTER — Ambulatory Visit
Admission: RE | Admit: 2015-08-27 | Discharge: 2015-08-27 | Disposition: A | Discharge status: Home / Self Care | Payer: Medicare Other | Source: Ambulatory Visit | Attending: Cardiology | Admitting: Cardiology

## 2015-08-27 ENCOUNTER — Other Ambulatory Visit: Payer: Self-pay | Admitting: Cardiology

## 2015-08-27 DIAGNOSIS — R0602 Shortness of breath: Secondary | ICD-10-CM

## 2016-02-16 ENCOUNTER — Encounter: Payer: Self-pay | Admitting: Emergency Medicine

## 2016-02-16 ENCOUNTER — Ambulatory Visit (INDEPENDENT_AMBULATORY_CARE_PROVIDER_SITE_OTHER): Payer: BLUE CROSS/BLUE SHIELD | Admitting: Emergency Medicine

## 2016-02-16 VITALS — BP 128/62 | HR 58 | Ht 68.0 in | Wt 179.6 lb

## 2016-02-16 DIAGNOSIS — F17200 Nicotine dependence, unspecified, uncomplicated: Secondary | ICD-10-CM

## 2016-02-16 DIAGNOSIS — R0600 Dyspnea, unspecified: Secondary | ICD-10-CM

## 2016-02-16 NOTE — Patient Instructions (Addendum)
We will perform full pulmonary function testing to evaluate your breathing Continue to have your albuterol available to use 2 puffs as needed for shortness of breath.  We will work on decreasing your cigarette smoking. Our goal will be to stop some day.  Follow with Dr Lamonte Sakai next available with full PFT.

## 2016-02-16 NOTE — Assessment & Plan Note (Signed)
With presumed COPD. He does have exertional dyspnea and other symptoms are consistent. He needs a function testing and depending on his FEV1 we will probably start maintenance bronchodilator therapy. We discussed smoking in detail including rationale for quitting and strategies for decreasing in quitting. We will revisit starting a quit date at his next visit. He has not tolerated Chantix but bupropion  might be an option. Also he asked about possible lung cancer. I introduced the concept of lung cancer screening to him. We will talk about this in more detail at his next visit and possibly refer him for low-dose CT scan screening

## 2016-02-16 NOTE — Progress Notes (Signed)
Subjective:    Patient ID: Jack Taylor, male    DOB: 1946/01/14, 70 y.o.   MRN: [id number]  HPI 70 year old active smoker (40 pack years) with a history of hypertension and hypercholesterolemia that a been followed by Dr. Einar Gip. He also has a history of GERD with significant improvement in chest discomfort after starting a PPI. He had a reassuring nuclear stress test and a normal echocardiogram in 11/2014.  He is referred for continued evaluation of continued SOB. He describes a sensation of dyspnea when he layed down at night - felt suffocated. He was started on valium for possible component of anxiety. He has exertional SOB, with climbing stairs. He has some cough, correlates with his smoking. Has a SABA but does not use frequently, about every other day. He is on an ACE-i. He had side effects from chantix.     Review of Systems  Constitutional: Negative for fever and unexpected weight change.  HENT: Positive for trouble swallowing. Negative for congestion, dental problem, ear pain, nosebleeds, postnasal drip, rhinorrhea, sinus pressure, sneezing and sore throat.   Eyes: Negative for redness and itching.  Respiratory: Positive for cough, shortness of breath and wheezing. Negative for chest tightness.   Cardiovascular: Negative for palpitations and leg swelling.  Gastrointestinal: Negative for nausea and vomiting.  Genitourinary: Negative for dysuria.  Musculoskeletal: Negative for joint swelling.  Skin: Negative for rash.  Neurological: Negative for headaches.  Hematological: Bruises/bleeds easily.  Psychiatric/Behavioral: Negative for dysphoric mood. The patient is nervous/anxious.    Past Medical History  Diagnosis Date  . Hypertension   . High cholesterol      Family History  Problem Relation Age of Onset  . Colon cancer Neg Hx   . Stomach cancer Mother   . Heart disease Father      Social History   Social History  . Marital Status: Married    Spouse Name: N/A  . Number  of Children: N/A  . Years of Education: N/A   Occupational History  . Not on file.   Social History Main Topics  . Smoking status: Current Some Day Smoker -- 1.00 packs/day    Types: Cigarettes  . Smokeless tobacco: Never Used  . Alcohol Use: No  . Drug Use: No  . Sexual Activity: Not on file   Other Topics Concern  . Not on file   Social History Narrative  he worked in a [employer], was exposed to heavy dust.   No Known Allergies   Outpatient Prescriptions Prior to Visit  Medication Sig Dispense Refill  . aspirin EC 81 MG tablet Take 1 tablet (81 mg total) by mouth daily. 30 tablet 0  . benazepril (LOTENSIN) 20 MG tablet Take 20 mg by mouth daily.      . rosuvastatin (CRESTOR) 20 MG tablet Take 20 mg by mouth daily.      . pantoprazole (PROTONIX) 40 MG tablet Take 1 tablet (40 mg total) by mouth 2 (two) times daily before a meal. 60 tablet 1   No facility-administered medications prior to visit.         Objective:   Physical Exam  Filed Vitals:   02/16/16 1500  BP: 128/62  Pulse: 58  Height: '5\' 8"'$  (1.727 m)  Weight: 179 lb 9.6 oz (81.466 kg)  SpO2: 95%   Gen: Pleasant, well-nourished, in no distress,  normal affect  ENT: No lesions,  mouth clear,  oropharynx clear, no postnasal drip  Neck: No JVD, no TMG, no carotid  bruits  Lungs: No use of accessory muscles, mild left-sided inspiratory squeak, clear without rales or rhonchi  Cardiovascular: RRR, heart sounds normal, no murmur or gallops, no peripheral edema  Musculoskeletal: No deformities, no cyanosis or clubbing  Neuro: alert, non focal  Skin: Warm, no lesions or rashes       Assessment & Plan:  Tobacco use disorder With presumed COPD. He does have exertional dyspnea and other symptoms are consistent. He needs a function testing and depending on his FEV1 we will probably start maintenance bronchodilator therapy. We discussed smoking in detail including rationale for quitting and strategies for  decreasing in quitting. We will revisit starting a quit date at his next visit. He has not tolerated Chantix but bupropion  might be an option. Also he asked about possible lung cancer. I introduced the concept of lung cancer screening to him. We will talk about this in more detail at his next visit and possibly refer him for low-dose CT scan screening

## 2016-04-20 ENCOUNTER — Ambulatory Visit (INDEPENDENT_AMBULATORY_CARE_PROVIDER_SITE_OTHER): Payer: BLUE CROSS/BLUE SHIELD | Admitting: Emergency Medicine

## 2016-04-20 ENCOUNTER — Encounter: Payer: Self-pay | Admitting: Emergency Medicine

## 2016-04-20 ENCOUNTER — Ambulatory Visit (INDEPENDENT_AMBULATORY_CARE_PROVIDER_SITE_OTHER): Payer: Medicare Other | Admitting: Emergency Medicine

## 2016-04-20 VITALS — BP 100/60 | HR 58

## 2016-04-20 DIAGNOSIS — R0600 Dyspnea, unspecified: Secondary | ICD-10-CM | POA: Diagnosis not present

## 2016-04-20 DIAGNOSIS — J449 Chronic obstructive pulmonary disease, unspecified: Secondary | ICD-10-CM | POA: Diagnosis not present

## 2016-04-20 DIAGNOSIS — F17200 Nicotine dependence, unspecified, uncomplicated: Secondary | ICD-10-CM

## 2016-04-20 LAB — PULMONARY FUNCTION TEST
DL/VA % PRED: 106 %
DL/VA: 4.86 ml/min/mmHg/L
DLCO COR: 20 ml/min/mmHg
DLCO UNC % PRED: 62 %
DLCO cor % pred: 63 %
DLCO unc: 19.71 ml/min/mmHg
FEF 25-75 Post: 1.23 L/sec
FEF 25-75 Pre: 0.7 L/sec
FEF2575-%Change-Post: 75 %
FEF2575-%Pred-Post: 51 %
FEF2575-%Pred-Pre: 29 %
FEV1-%Change-Post: 22 %
FEV1-%Pred-Post: 50 %
FEV1-%Pred-Pre: 40 %
FEV1-Post: 1.59 L
FEV1-Pre: 1.3 L
FEV1FVC-%Change-Post: 0 %
FEV1FVC-%Pred-Pre: 81 %
FEV6-%CHANGE-POST: 28 %
FEV6-%PRED-POST: 64 %
FEV6-%PRED-PRE: 50 %
FEV6-PRE: 2.05 L
FEV6-Post: 2.63 L
FEV6FVC-%Change-Post: 0 %
FEV6FVC-%PRED-PRE: 104 %
FEV6FVC-%Pred-Post: 105 %
FVC-%Change-Post: 23 %
FVC-%PRED-POST: 61 %
FVC-%Pred-Pre: 49 %
FVC-Post: 2.65 L
FVC-Pre: 2.15 L
POST FEV6/FVC RATIO: 99 %
PRE FEV1/FVC RATIO: 60 %
Post FEV1/FVC ratio: 60 %
Pre FEV6/FVC Ratio: 99 %
RV % PRED: 174 %
RV: 4.25 L
TLC % PRED: 94 %
TLC: 6.54 L

## 2016-04-20 MED ORDER — TIOTROPIUM BROMIDE MONOHYDRATE 18 MCG IN CAPS: 18.0000 ug | ORAL_CAPSULE | Freq: Every day | RESPIRATORY_TRACT | Status: DC

## 2016-04-20 NOTE — Progress Notes (Signed)
Subjective:    Jack Taylor ID: Jack Taylor, male    DOB: 1946-07-15, 70 y.o.   MRN: [id number]  Shortness of Breath Associated symptoms include wheezing. Pertinent negatives include no ear pain, fever, headaches, leg swelling, rash, rhinorrhea, sore throat or vomiting.   70 year old active smoker (40 pack years) with a history of hypertension and hypercholesterolemia that a been followed by Dr. Einar Gip. Jack Taylor also has a history of GERD with significant improvement in chest discomfort after starting a PPI. Jack Taylor had a reassuring nuclear stress test and a normal echocardiogram in 11/2014.  Jack Taylor is referred for continued evaluation of continued SOB. Jack Taylor describes a sensation of dyspnea when Jack Taylor layed down at night - felt suffocated. Jack Taylor was started on valium for possible component of anxiety. Jack Taylor has exertional SOB, with climbing stairs. Jack Taylor has some cough, correlates with his smoking. Has a SABA but does not use frequently, about every other day. Jack Taylor is on an ACE-i. Jack Taylor had side effects from chantix.   ROV 04/20/16 -- Jack Taylor with a history of tobacco use and exertional dyspnea, cough. Jack Taylor underwent pulmonary function testing today that I personally reviewed. They show severe obstruction with a positive bronchodilator response, hyperinflated lung volumes and a decreased DLCO that corrects for alveolar volume. Jack Taylor continues to have SOB, some cough. Jack Taylor has decreased his cigarettes significantly - about 1 pk in the last 2 weeks.     Review of Systems  Constitutional: Negative for fever and unexpected weight change.  HENT: Positive for trouble swallowing. Negative for congestion, dental problem, ear pain, nosebleeds, postnasal drip, rhinorrhea, sinus pressure, sneezing and sore throat.   Eyes: Negative for redness and itching.  Respiratory: Positive for cough, shortness of breath and wheezing. Negative for chest tightness.   Cardiovascular: Negative for palpitations and leg swelling.  Gastrointestinal: Negative for nausea and  vomiting.  Genitourinary: Negative for dysuria.  Musculoskeletal: Negative for joint swelling.  Skin: Negative for rash.  Neurological: Negative for headaches.  Hematological: Bruises/bleeds easily.  Psychiatric/Behavioral: Negative for dysphoric mood. The Jack Taylor is nervous/anxious.    Past Medical History  Diagnosis Date  . Hypertension   . High cholesterol      Family History  Problem Relation Age of Onset  . Colon cancer Neg Hx   . Stomach cancer Mother   . Heart disease Father      Social History   Social History  . Marital Status: Married    Spouse Name: N/A  . Number of Children: N/A  . Years of Education: N/A   Occupational History  . Not on file.   Social History Main Topics  . Smoking status: Current Some Day Smoker -- 1.00 packs/day    Types: Cigarettes  . Smokeless tobacco: Never Used  . Alcohol Use: No  . Drug Use: No  . Sexual Activity: Not on file   Other Topics Concern  . Not on file   Social History Narrative  Jack Taylor worked in a [employer], was exposed to heavy dust.   No Known Allergies   Outpatient Prescriptions Prior to Visit  Medication Sig Dispense Refill  . albuterol (PROVENTIL HFA;VENTOLIN HFA) 108 (90 Base) MCG/ACT inhaler Inhale into the lungs every 6 (six) hours as needed for wheezing or shortness of breath.    Marland Kitchen aspirin EC 81 MG tablet Take 1 tablet (81 mg total) by mouth daily. 30 tablet 0  . benazepril (LOTENSIN) 20 MG tablet Take 20 mg by mouth daily.      . diazepam (  VALIUM) 2 MG tablet TAKE 1 TABLET 3 TIMES A DAY AS NEEDED FOR DIZZINESS OR NERVES  2  . rosuvastatin (CRESTOR) 20 MG tablet Take 20 mg by mouth daily.      . pantoprazole (PROTONIX) 40 MG tablet Take 1 tablet (40 mg total) by mouth 2 (two) times daily before a meal. 60 tablet 1   No facility-administered medications prior to visit.         Objective:   Physical Exam  Filed Vitals:   04/20/16 1436  BP: 100/60  Pulse: 58  SpO2: 94%   Gen: Pleasant,  well-nourished, in no distress,  normal affect  ENT: No lesions,  mouth clear,  oropharynx clear, no postnasal drip  Neck: No JVD, no TMG, no carotid bruits  Lungs: No use of accessory muscles, mild left-sided inspiratory squeak, clear without rales or rhonchi  Cardiovascular: RRR, heart sounds normal, no murmur or gallops, no peripheral edema  Musculoskeletal: No deformities, no cyanosis or clubbing  Neuro: alert, non focal  Skin: Warm, no lesions or rashes       Assessment & Plan:  COPD (chronic obstructive pulmonary disease) ([place]) Reviewed his primary function testing with him today. It shows severe obstruction with bronchodilator response. We will initiate Spiriva one daily. If Jack Taylor benefits that we will continue. Given his bronchodilator responsiveness we may decide to add a LABA/ICS at some point in the future. Discussed his smoking as below  Tobacco use disorder Jack Taylor has cut down significantly and I encouraged him to try to stop altogether.   Baltazar Apo, MD, PhD 04/20/2016, 3:28 PM [facility] [phone] or if no answer [phone]

## 2016-04-20 NOTE — Assessment & Plan Note (Signed)
Reviewed his primary function testing with him today. It shows severe obstruction with bronchodilator response. We will initiate Spiriva one daily. If he benefits that we will continue. Given his bronchodilator responsiveness we may decide to add a LABA/ICS at some point in the future. Discussed his smoking as below

## 2016-04-20 NOTE — Progress Notes (Signed)
PFT done today. 

## 2016-04-20 NOTE — Assessment & Plan Note (Signed)
He has cut down significantly and I encouraged him to try to stop altogether.

## 2016-04-20 NOTE — Patient Instructions (Signed)
Congratulations on decreasing your smoking. Continue to work hard on stopping altogether. We will start Spiriva 1 inhalation daily Continue to have albuterol available to use 2 puffs if needed for shortness of breath. Follow with Dr Lamonte Sakai in 6 weeks or sooner if you have any problems

## 2016-06-02 ENCOUNTER — Encounter: Payer: Self-pay | Admitting: Emergency Medicine

## 2016-06-02 ENCOUNTER — Ambulatory Visit (INDEPENDENT_AMBULATORY_CARE_PROVIDER_SITE_OTHER): Payer: BLUE CROSS/BLUE SHIELD | Admitting: Emergency Medicine

## 2016-06-02 VITALS — BP 118/62 | HR 50 | Ht 69.0 in | Wt 173.0 lb

## 2016-06-02 DIAGNOSIS — J449 Chronic obstructive pulmonary disease, unspecified: Secondary | ICD-10-CM | POA: Diagnosis not present

## 2016-06-02 DIAGNOSIS — F17200 Nicotine dependence, unspecified, uncomplicated: Secondary | ICD-10-CM | POA: Diagnosis not present

## 2016-06-02 NOTE — Patient Instructions (Addendum)
Please continue your Spiriva once a day We will refill your albuterol. Take albuterol 2 puffs up to every 4 hours if needed for shortness of breath.  Please think about possibly setting a quit date for your cigarettes. When you do so, we can discuss any medications that might be help you prepare for the quit date. There are other options in addition to Chantix that we could try.  Follow with Dr Lamonte Sakai in 6 months or sooner if you have any problems

## 2016-06-02 NOTE — Assessment & Plan Note (Signed)
  Please think about possibly setting a quit date for your cigarettes. When you do so, we can discuss any medications that might be help you prepare for the quit date. There are other options in addition to Chantix that we could try.  Follow with Dr Lamonte Sakai in 6 months or sooner if you have any problems

## 2016-06-02 NOTE — Progress Notes (Signed)
Subjective:    Patient ID: Jack Taylor, male    DOB: Aug 07, 1946, 70 y.o.   MRN: [id number]  Shortness of Breath Associated symptoms include wheezing. Pertinent negatives include no ear pain, fever, headaches, leg swelling, rash, rhinorrhea, sore throat or vomiting.   70 year old active smoker (40 pack years) with a history of hypertension and hypercholesterolemia that a been followed by Dr. Einar Gip. He also has a history of GERD with significant improvement in chest discomfort after starting a PPI. He had a reassuring nuclear stress test and a normal echocardiogram in 11/2014.  He is referred for continued evaluation of continued SOB. He describes a sensation of dyspnea when he layed down at night - felt suffocated. He was started on valium for possible component of anxiety. He has exertional SOB, with climbing stairs. He has some cough, correlates with his smoking. Has a SABA but does not use frequently, about every other day. He is on an ACE-i. He had side effects from chantix.   ROV 04/20/16 -- patient with a history of tobacco use and exertional dyspnea, cough. He underwent pulmonary function testing today that I personally reviewed. They show severe obstruction with a positive bronchodilator response, hyperinflated lung volumes and a decreased DLCO that corrects for alveolar volume. He continues to have SOB, some cough. He has decreased his cigarettes significantly - about 1 pk in the last 2 weeks.   ROV 06/02/16 -- follow-up visit for severe obstructive lung disease noted on pulmonary function testing that was done to evaluate dyspnea. At our last visit we started Spiriva to see if he would benefit. He did take it, feels that he has probably benefited. He is not as dyspneic with exertion, able to garden and work in the yard. He continues smoke about a pack over a week.    Review of Systems  Constitutional: Negative for fever and unexpected weight change.  HENT: Positive for trouble swallowing.  Negative for congestion, dental problem, ear pain, nosebleeds, postnasal drip, rhinorrhea, sinus pressure, sneezing and sore throat.   Eyes: Negative for redness and itching.  Respiratory: Positive for cough, shortness of breath and wheezing. Negative for chest tightness.   Cardiovascular: Negative for palpitations and leg swelling.  Gastrointestinal: Negative for nausea and vomiting.  Genitourinary: Negative for dysuria.  Musculoskeletal: Negative for joint swelling.  Skin: Negative for rash.  Neurological: Negative for headaches.  Hematological: Bruises/bleeds easily.  Psychiatric/Behavioral: Negative for dysphoric mood. The patient is nervous/anxious.    Past Medical History  Diagnosis Date  . Hypertension   . High cholesterol      Family History  Problem Relation Age of Onset  . Colon cancer Neg Hx   . Stomach cancer Mother   . Heart disease Father      Social History   Social History  . Marital Status: Married    Spouse Name: N/A  . Number of Children: N/A  . Years of Education: N/A   Occupational History  . Not on file.   Social History Main Topics  . Smoking status: Current Some Day Smoker -- 1.00 packs/day    Types: Cigarettes  . Smokeless tobacco: Never Used  . Alcohol Use: No  . Drug Use: No  . Sexual Activity: Not on file   Other Topics Concern  . Not on file   Social History Narrative  he worked in a [employer], was exposed to heavy dust.   No Known Allergies   Outpatient Prescriptions Prior to Visit  Medication Sig Dispense  Refill  . aspirin EC 81 MG tablet Take 1 tablet (81 mg total) by mouth daily. 30 tablet 0  . benazepril (LOTENSIN) 20 MG tablet Take 20 mg by mouth daily.      . diazepam (VALIUM) 2 MG tablet TAKE 1 TABLET 3 TIMES A DAY AS NEEDED FOR DIZZINESS OR NERVES  2  . pantoprazole (PROTONIX) 40 MG tablet Take 40 mg by mouth daily.    . rosuvastatin (CRESTOR) 20 MG tablet Take 20 mg by mouth daily.      Marland Kitchen tiotropium (SPIRIVA) 18 MCG  inhalation capsule Place 1 capsule (18 mcg total) into inhaler and inhale daily. 90 capsule 3  . albuterol (PROVENTIL HFA;VENTOLIN HFA) 108 (90 Base) MCG/ACT inhaler Inhale into the lungs every 6 (six) hours as needed for wheezing or shortness of breath. Reported on 06/02/2016     No facility-administered medications prior to visit.         Objective:   Physical Exam  Filed Vitals:   06/02/16 1001 06/02/16 1002  BP:  118/62  Pulse:  50  Height: '5\' 9"'$  (1.753 m)   Weight: 173 lb (78.472 kg)   SpO2:  96%   Gen: Pleasant, well-nourished, in no distress,  normal affect  ENT: No lesions,  mouth clear,  oropharynx clear, no postnasal drip  Neck: No JVD, no TMG, no carotid bruits  Lungs: No use of accessory muscles, mild left-sided inspiratory squeak, clear without rales or rhonchi  Cardiovascular: RRR, heart sounds normal, no murmur or gallops, no peripheral edema  Musculoskeletal: No deformities, no cyanosis or clubbing  Neuro: alert, non focal  Skin: Warm, no lesions or rashes       Assessment & Plan:  Tobacco use disorder  Please think about possibly setting a quit date for your cigarettes. When you do so, we can discuss any medications that might be help you prepare for the quit date. There are other options in addition to Chantix that we could try.  Follow with Dr Lamonte Sakai in 6 months or sooner if you have any problems  COPD (chronic obstructive pulmonary disease) ([place]) Appears to have benefited from the Spiriva. We will continue this once a day. We will fill a prescription for albuterol for him to use when necessary. Discussed smoking cessation as below.   Baltazar Apo, MD, PhD 06/02/2016, 10:36 AM [facility] [phone] or if no answer [phone]

## 2016-06-02 NOTE — Assessment & Plan Note (Signed)
Appears to have benefited from the Spiriva. We will continue this once a day. We will fill a prescription for albuterol for him to use when necessary. Discussed smoking cessation as below.

## 2016-11-25 ENCOUNTER — Ambulatory Visit (INDEPENDENT_AMBULATORY_CARE_PROVIDER_SITE_OTHER): Payer: BLUE CROSS/BLUE SHIELD | Admitting: Physician Assistant

## 2016-11-25 ENCOUNTER — Encounter: Payer: Self-pay | Admitting: Physician Assistant

## 2016-11-25 VITALS — BP 120/62 | HR 64 | Ht 67.25 in | Wt 170.5 lb

## 2016-11-25 DIAGNOSIS — Z8719 Personal history of other diseases of the digestive system: Secondary | ICD-10-CM | POA: Diagnosis not present

## 2016-11-25 DIAGNOSIS — R195 Other fecal abnormalities: Secondary | ICD-10-CM

## 2016-11-25 DIAGNOSIS — D649 Anemia, unspecified: Secondary | ICD-10-CM

## 2016-11-25 DIAGNOSIS — Z86010 Personal history of colonic polyps: Secondary | ICD-10-CM | POA: Diagnosis not present

## 2016-11-25 MED ORDER — NA SULFATE-K SULFATE-MG SULF 17.5-3.13-1.6 GM/177ML PO SOLN: 1.0000 | ORAL | 0 refills | Status: DC

## 2016-11-25 NOTE — Progress Notes (Signed)
I agree with the above note, plan 

## 2016-11-25 NOTE — Progress Notes (Signed)
Chief Complaint: Hemoccult positive stools, anemia  HPI:    Jack Taylor is a 70 year old Caucasian male with a past medical history of adenomatous colon polyps, anxiety, COPD and GERD,  who was referred to me by Stephens Shire, MD for a complaint of Hemoccult-positive stools and anemia.   Per records patient has followed with Dr. Ardis Hughs in the past for screening colonoscopies with his last procedure 01/06/14, he had a finding of 4 polyps and numerous diverticulum in the left side of the colon. Polyps are found to be a mixture of tubular adenomas and a sessile serrated adenoma and he was told to repeat in 3 years.   Per referring physician's notes, patient had a Hemoccult-positive stool on 11/12/16 and a CBC at that time showed a red blood cell count minimally decreased at 4.6 with a normal hemoglobin of 14.6. CMP was normal.   Today, the patient presents to clinic and tells me that he has been doing well. He does see a pulmonologist downstairs for some COPD which is chronic for him, but is doing fairly well as far as this is concerned. He describes that he has had normal bowel movements and denies any abdominal pain or seeing any bright red blood or black tarry sticky stools. He does remind me that in 2012 he did have a finding of a duodenal ulcer, but he was experiencing black tarry sticky stools at that time as well as fatigue and shortness of breath. The patient is aware that he is due for a colonoscopy for screening purposes at the beginning of next year.   Patient denies fever, chills, blood in his stool, melena or change in bowel habits, weight loss, fatigue, nausea, vomiting, heartburn, reflux, dysphagia or abdominal pain.  Past Medical History:  Diagnosis Date  . Adenomatous colon polyp   . Anxiety   . COPD (chronic obstructive pulmonary disease) ([place])   . Gastric ulcer   . GERD (gastroesophageal reflux disease)   . High cholesterol   . Hypertension     Past Surgical History:  Procedure  Laterality Date  . COLONOSCOPY  2008  . ESOPHAGOGASTRODUODENOSCOPY  11/23/2011   Procedure: ESOPHAGOGASTRODUODENOSCOPY (EGD);  Surgeon: Owens Loffler, MD;  Location: [facility];  Service: Endoscopy;  Laterality: N/A;  . UPPER GASTROINTESTINAL ENDOSCOPY  11/23/11    Current Outpatient Prescriptions  Medication Sig Dispense Refill  . albuterol (PROVENTIL HFA;VENTOLIN HFA) 108 (90 Base) MCG/ACT inhaler Inhale into the lungs every 6 (six) hours as needed for wheezing or shortness of breath. Reported on 06/02/2016    . aspirin EC 81 MG tablet Take 1 tablet (81 mg total) by mouth daily. 30 tablet 0  . benazepril (LOTENSIN) 20 MG tablet Take 20 mg by mouth daily.      . diazepam (VALIUM) 2 MG tablet TAKE 1 TABLET 3 TIMES A DAY AS NEEDED FOR DIZZINESS OR NERVES  2  . pantoprazole (PROTONIX) 40 MG tablet Take 40 mg by mouth daily.    . rosuvastatin (CRESTOR) 20 MG tablet Take 20 mg by mouth daily.      Marland Kitchen tiotropium (SPIRIVA) 18 MCG inhalation capsule Place 1 capsule (18 mcg total) into inhaler and inhale daily. 90 capsule 3  . Na Sulfate-K Sulfate-Mg Sulf 17.5-3.13-1.6 GM/180ML SOLN Take 1 kit by mouth as directed. 354 mL 0   No current facility-administered medications for this visit.     Allergies as of 11/25/2016  . (No Known Allergies)    Family History  Problem Relation Age  of Onset  . Stomach cancer Mother   . Heart disease Father   . Colon cancer Neg Hx     Social History   Social History  . Marital status: Married    Spouse name: N/A  . Number of children: N/A  . Years of education: N/A   Occupational History  . Not on file.   Social History Main Topics  . Smoking status: Current Some Day Smoker    Packs/day: 1.00    Types: Cigarettes  . Smokeless tobacco: Never Used  . Alcohol use No  . Drug use: No  . Sexual activity: Not on file   Other Topics Concern  . Not on file   Social History Narrative  . No narrative on file    Review of Systems:      Constitutional: No weight loss, fever, chills, weakness or fatigue Cardiovascular: No chest pain Respiratory: No SOB  Gastrointestinal: See HPI and otherwise negative Genitourinary: No dysuria or change in urinary frequency Neurological: No headache Musculoskeletal: No new muscle or joint pain Hematologic: No bleeding  Psychiatric: No history of depression or anxiety   Physical Exam:  Vital signs: BP 120/62 (BP Location: Left Arm, Patient Position: Sitting, Cuff Size: Normal)   Pulse 64   Ht 5' 7.25" (1.708 m) Comment: height measured without shoes  Wt 170 lb 8 oz (77.3 kg)   BMI 26.51 kg/m   Constitutional:   Pleasant Caucasian male appears to be in NAD, Well developed, Well nourished, alert and cooperative Respiratory: Respirations even and unlabored. Lungs clear to auscultation bilaterally.   No wheezes, crackles, or rhonchi.  Cardiovascular: Normal S1, S2. No MRG. Regular rate and rhythm. No peripheral edema, cyanosis or pallor.  Gastrointestinal:  Soft, nondistended, nontender. No rebound or guarding. Normal bowel sounds. No appreciable masses or hepatomegaly.Marland Kitchen Psychiatric: Demonstrates good judgement and reason without abnormal affect or behaviors.  Recent labs: See HPI  Assessment: 1. History of colon polyps: Patient's last colonoscopy was in February 2015, findings of tubular adenomas and sessile serrated adenomas with recommendations for repeat in 3 years, patient will be due in February of next year 2. Hemoccult-positive stools: Patient did have a finding of blood in his stool at time of annual exam, he has not witnessed any himself and is feeling well otherwise;consider hemorrhoids versus other 3. Anemia: Primary care physician recognized a low red blood cell count as mild anemia, hemoglobin is normal in the 14's, see history of present illness 4. History of duodenal ulcer: In 2012, patient has been doing well on a daily PPI  Plan: 1. Recommend the patient proceed with  a colonoscopy for further evaluation. Discussed risks, benefits, limitations and alternatives and the patient agrees to proceed. This is scheduled with Dr. Ardis Hughs in the [facility]. 2. Patient to continue his Pantoprazole 40 mg daily 3. Patient to follow in clinic per Dr. Ardis Hughs recommendations after time of  procedure.  Ellouise Newer, PA-C [facility] 11/25/2016, 9:06 AM  Cc: Stephens Shire, MD

## 2016-11-25 NOTE — Patient Instructions (Signed)

## 2016-12-02 ENCOUNTER — Encounter: Payer: Self-pay | Admitting: Physician Assistant

## 2016-12-19 ENCOUNTER — Encounter: Payer: Self-pay | Admitting: Gastroenterology

## 2016-12-19 ENCOUNTER — Ambulatory Visit (AMBULATORY_SURGERY_CENTER): Payer: BLUE CROSS/BLUE SHIELD | Admitting: Gastroenterology

## 2016-12-19 VITALS — BP 111/63 | HR 76 | Temp 97.7°F | Resp 18 | Ht 67.25 in | Wt 170.0 lb

## 2016-12-19 DIAGNOSIS — D128 Benign neoplasm of rectum: Secondary | ICD-10-CM | POA: Diagnosis not present

## 2016-12-19 DIAGNOSIS — D129 Benign neoplasm of anus and anal canal: Secondary | ICD-10-CM

## 2016-12-19 DIAGNOSIS — Z860100 Personal history of colon polyps, unspecified: Secondary | ICD-10-CM

## 2016-12-19 DIAGNOSIS — K621 Rectal polyp: Secondary | ICD-10-CM

## 2016-12-19 DIAGNOSIS — D125 Benign neoplasm of sigmoid colon: Secondary | ICD-10-CM

## 2016-12-19 DIAGNOSIS — K5730 Diverticulosis of large intestine without perforation or abscess without bleeding: Secondary | ICD-10-CM | POA: Diagnosis not present

## 2016-12-19 MED ORDER — SODIUM CHLORIDE 0.9 % IV SOLN: 500.0000 mL | INTRAVENOUS | Status: DC

## 2016-12-19 NOTE — Progress Notes (Signed)
Report given to PACU RN, vss 

## 2016-12-19 NOTE — Progress Notes (Signed)
Called to room to assist during endoscopic procedure.  Patient ID and intended procedure confirmed with present staff. Received instructions for my participation in the procedure from the performing physician.  

## 2016-12-19 NOTE — Patient Instructions (Signed)
YOU HAD AN ENDOSCOPIC PROCEDURE TODAY AT [facility]:   Refer to the procedure report that was given to you for any specific questions about what was found during the examination.  If the procedure report does not answer your questions, please call your gastroenterologist to clarify.  If you requested that your care partner not be given the details of your procedure findings, then the procedure report has been included in a sealed envelope for you to review at your convenience later.  YOU SHOULD EXPECT: Some feelings of bloating in the abdomen. Passage of more gas than usual.  Walking can help get rid of the air that was put into your GI tract during the procedure and reduce the bloating. If you had a lower endoscopy (such as a colonoscopy or flexible sigmoidoscopy) you may notice spotting of blood in your stool or on the toilet paper. If you underwent a bowel prep for your procedure, you may not have a normal bowel movement for a few days.  Please Note:  You might notice some irritation and congestion in your nose or some drainage.  This is from the oxygen used during your procedure.  There is no need for concern and it should clear up in a day or so.  SYMPTOMS TO REPORT IMMEDIATELY:   Following lower endoscopy (colonoscopy or flexible sigmoidoscopy):  Excessive amounts of blood in the stool  Significant tenderness or worsening of abdominal pains  Swelling of the abdomen that is new, acute  Fever of 100F or higher   For urgent or emergent issues, a gastroenterologist can be reached at any hour by calling [phone].   DIET:  We do recommend a small meal at first, but then you may proceed to your regular diet.  Drink plenty of fluids but you should avoid alcoholic beverages for 24 hours. Try to increase the fiber in your diet, and drink plenty of water.  ACTIVITY:  You should plan to take it easy for the rest of today and you should NOT DRIVE or use heavy machinery until  tomorrow (because of the sedation medicines used during the test).    FOLLOW UP: Our staff will call the number listed on your records the next business day following your procedure to check on you and address any questions or concerns that you may have regarding the information given to you following your procedure. If we do not reach you, we will leave a message.  However, if you are feeling well and you are not experiencing any problems, there is no need to return our call.  We will assume that you have returned to your regular daily activities without incident.  If any biopsies were taken you will be contacted by phone or by letter within the next 1-3 weeks.  Please call us at [phone] if you have not heard about the biopsies in 3 weeks.    SIGNATURES/CONFIDENTIALITY: You and/or your care partner have signed paperwork which will be entered into your electronic medical record.  These signatures attest to the fact that that the information above on your After Visit Summary has been reviewed and is understood.  Full responsibility of the confidentiality of this discharge information lies with you and/or your care-partner.  Read all of the handouts given to you by your recovery room nurse.  Thank-you for choosing [facility] for your healthcare needs today.

## 2016-12-19 NOTE — Op Note (Signed)
[place] Patient Name: Jack Taylor Procedure Date: 12/19/2016 2:29 PM MRN: [id number] Endoscopist: Milus Banister , MD Age: 71 Referring MD:  Date of Birth: November 27, 1946 Gender: Male Account #: [id number] Procedure:                Colonoscopy Indications:              High risk colon cancer surveillance: Personal                            history of colonic polyps; colonoscopy Dr. Ardis Hughs                            01/2014 four subCM polyps (mixed TAs and SSAs) Medicines:                Monitored Anesthesia Care Procedure:                Pre-Anesthesia Assessment:                           - Prior to the procedure, a History and Physical                            was performed, and patient medications and                            allergies were reviewed. The patient's tolerance of                            previous anesthesia was also reviewed. The risks                            and benefits of the procedure and the sedation                            options and risks were discussed with the patient.                            All questions were answered, and informed consent                            was obtained. Prior Anticoagulants: The patient has                            taken no previous anticoagulant or antiplatelet                            agents. ASA Grade Assessment: III - A patient with                            severe systemic disease. After reviewing the risks                            and benefits, the patient was deemed in  satisfactory condition to undergo the procedure.                           After obtaining informed consent, the colonoscope                            was passed under direct vision. Throughout the                            procedure, the patient's blood pressure, pulse, and                            oxygen saturations were monitored continuously. The                            Model CF-HQ190L  [id number]) scope was introduced                            through the anus and advanced to the the cecum,                            identified by appendiceal orifice and ileocecal                            valve. The colonoscopy was performed without                            difficulty. The patient tolerated the procedure                            well. The quality of the bowel preparation was                            good. The ileocecal valve, appendiceal orifice, and                            rectum were photographed. Scope In: 2:36:35 PM Scope Out: 2:46:34 PM Scope Withdrawal Time: 0 hours 8 minutes 20 seconds  Total Procedure Duration: 0 hours 9 minutes 59 seconds  Findings:                 A 7 mm polyp was found in the sigmoid colon. The                            polyp was sessile. The polyp was removed with a hot                            snare. Resection and retrieval were complete.                           A 4 mm polyp was found in the rectum. The polyp was                            sessile. The  polyp was removed with a cold snare.                            Resection and retrieval were complete.                           Multiple small and large-mouthed diverticula were                            found in the left colon.                           The exam was otherwise without abnormality on                            direct and retroflexion views. Complications:            No immediate complications. Estimated blood loss:                            None. Estimated Blood Loss:     Estimated blood loss: none. Impression:               - One 7 mm polyp in the sigmoid colon, removed with                            a hot snare. Resected and retrieved.                           - One 4 mm polyp in the rectum, removed with a cold                            snare. Resected and retrieved.                           - Diverticulosis in the left colon.                            - The examination was otherwise normal on direct                            and retroflexion views. Recommendation:           - Patient has a contact number available for                            emergencies. The signs and symptoms of potential                            delayed complications were discussed with the                            patient. Return to normal activities tomorrow.                            Written discharge instructions were provided  to the                            patient.                           - Resume previous diet.                           - Continue present medications.                           You will receive a letter within 2-3 weeks with the                            pathology results and my final recommendations.                           If the polyp(s) is proven to be 'pre-cancerous' on                            pathology, you will need repeat colonoscopy in 5                            years. Milus Banister, MD 12/19/2016 2:49:54 PM This report has been signed electronically.

## 2016-12-20 ENCOUNTER — Telehealth: Payer: Self-pay | Admitting: *Deleted

## 2016-12-20 ENCOUNTER — Telehealth: Payer: Self-pay

## 2016-12-20 NOTE — Telephone Encounter (Signed)
  Follow up Call-  Call back number 12/19/2016  Post procedure Call Back phone  # [phone]  Permission to leave phone message Yes  Some recent data might be hidden    Patient was called for follow up after his procedure on 12/19/2016. No answer at the number given for follow up phone call. A message was left on the answering machine.

## 2016-12-20 NOTE — Telephone Encounter (Signed)
  Follow up Call-  Call back number 12/19/2016  Post procedure Call Back phone  # [phone]  Permission to leave phone message Yes  Some recent data might be hidden     Patient questions:  Message left to call us if necessary.

## 2016-12-22 ENCOUNTER — Encounter: Payer: Self-pay | Admitting: Gastroenterology

## 2018-01-15 DIAGNOSIS — L570 Actinic keratosis: Secondary | ICD-10-CM | POA: Diagnosis not present

## 2018-03-19 DIAGNOSIS — L570 Actinic keratosis: Secondary | ICD-10-CM | POA: Diagnosis not present

## 2018-03-19 DIAGNOSIS — L814 Other melanin hyperpigmentation: Secondary | ICD-10-CM | POA: Diagnosis not present

## 2018-03-19 DIAGNOSIS — L309 Dermatitis, unspecified: Secondary | ICD-10-CM | POA: Diagnosis not present

## 2018-03-19 DIAGNOSIS — D225 Melanocytic nevi of trunk: Secondary | ICD-10-CM | POA: Diagnosis not present

## 2018-03-19 DIAGNOSIS — D1801 Hemangioma of skin and subcutaneous tissue: Secondary | ICD-10-CM | POA: Diagnosis not present

## 2018-03-19 DIAGNOSIS — Z85828 Personal history of other malignant neoplasm of skin: Secondary | ICD-10-CM | POA: Diagnosis not present

## 2018-03-19 DIAGNOSIS — L400 Psoriasis vulgaris: Secondary | ICD-10-CM | POA: Diagnosis not present

## 2018-03-19 DIAGNOSIS — L821 Other seborrheic keratosis: Secondary | ICD-10-CM | POA: Diagnosis not present

## 2018-04-24 DIAGNOSIS — L218 Other seborrheic dermatitis: Secondary | ICD-10-CM | POA: Diagnosis not present

## 2018-05-15 DIAGNOSIS — R7301 Impaired fasting glucose: Secondary | ICD-10-CM | POA: Diagnosis not present

## 2018-05-15 DIAGNOSIS — I10 Essential (primary) hypertension: Secondary | ICD-10-CM | POA: Diagnosis not present

## 2018-05-15 DIAGNOSIS — E7800 Pure hypercholesterolemia, unspecified: Secondary | ICD-10-CM | POA: Diagnosis not present

## 2018-05-17 ENCOUNTER — Other Ambulatory Visit: Payer: Self-pay | Admitting: Family Medicine

## 2018-05-17 DIAGNOSIS — N183 Chronic kidney disease, stage 3 (moderate): Secondary | ICD-10-CM | POA: Diagnosis not present

## 2018-05-17 DIAGNOSIS — I129 Hypertensive chronic kidney disease with stage 1 through stage 4 chronic kidney disease, or unspecified chronic kidney disease: Secondary | ICD-10-CM | POA: Diagnosis not present

## 2018-05-17 DIAGNOSIS — Z0000 Encounter for general adult medical examination without abnormal findings: Secondary | ICD-10-CM | POA: Diagnosis not present

## 2018-05-17 DIAGNOSIS — E7800 Pure hypercholesterolemia, unspecified: Secondary | ICD-10-CM | POA: Diagnosis not present

## 2018-05-17 DIAGNOSIS — F17200 Nicotine dependence, unspecified, uncomplicated: Secondary | ICD-10-CM

## 2018-05-17 DIAGNOSIS — Z1159 Encounter for screening for other viral diseases: Secondary | ICD-10-CM | POA: Diagnosis not present

## 2018-05-21 ENCOUNTER — Ambulatory Visit: Payer: BLUE CROSS/BLUE SHIELD

## 2018-11-09 DIAGNOSIS — I129 Hypertensive chronic kidney disease with stage 1 through stage 4 chronic kidney disease, or unspecified chronic kidney disease: Secondary | ICD-10-CM | POA: Diagnosis not present

## 2018-11-09 DIAGNOSIS — N183 Chronic kidney disease, stage 3 (moderate): Secondary | ICD-10-CM | POA: Diagnosis not present

## 2018-11-09 DIAGNOSIS — Z1159 Encounter for screening for other viral diseases: Secondary | ICD-10-CM | POA: Diagnosis not present

## 2018-11-09 DIAGNOSIS — R7303 Prediabetes: Secondary | ICD-10-CM | POA: Diagnosis not present

## 2018-11-09 DIAGNOSIS — E7800 Pure hypercholesterolemia, unspecified: Secondary | ICD-10-CM | POA: Diagnosis not present

## 2018-11-12 ENCOUNTER — Other Ambulatory Visit: Payer: Self-pay | Admitting: Family Medicine

## 2018-11-12 DIAGNOSIS — R7303 Prediabetes: Secondary | ICD-10-CM | POA: Diagnosis not present

## 2018-11-12 DIAGNOSIS — F17218 Nicotine dependence, cigarettes, with other nicotine-induced disorders: Secondary | ICD-10-CM

## 2018-11-12 DIAGNOSIS — N183 Chronic kidney disease, stage 3 (moderate): Secondary | ICD-10-CM | POA: Diagnosis not present

## 2018-11-12 DIAGNOSIS — K219 Gastro-esophageal reflux disease without esophagitis: Secondary | ICD-10-CM | POA: Diagnosis not present

## 2018-11-12 DIAGNOSIS — L570 Actinic keratosis: Secondary | ICD-10-CM | POA: Diagnosis not present

## 2018-11-12 DIAGNOSIS — L814 Other melanin hyperpigmentation: Secondary | ICD-10-CM | POA: Diagnosis not present

## 2018-11-12 DIAGNOSIS — L821 Other seborrheic keratosis: Secondary | ICD-10-CM | POA: Diagnosis not present

## 2018-11-12 DIAGNOSIS — L218 Other seborrheic dermatitis: Secondary | ICD-10-CM | POA: Diagnosis not present

## 2018-11-12 DIAGNOSIS — Z85828 Personal history of other malignant neoplasm of skin: Secondary | ICD-10-CM | POA: Diagnosis not present

## 2018-11-12 DIAGNOSIS — I129 Hypertensive chronic kidney disease with stage 1 through stage 4 chronic kidney disease, or unspecified chronic kidney disease: Secondary | ICD-10-CM | POA: Diagnosis not present

## 2018-11-12 DIAGNOSIS — E7800 Pure hypercholesterolemia, unspecified: Secondary | ICD-10-CM | POA: Diagnosis not present

## 2018-11-12 DIAGNOSIS — J449 Chronic obstructive pulmonary disease, unspecified: Secondary | ICD-10-CM | POA: Diagnosis not present

## 2018-11-12 DIAGNOSIS — D1801 Hemangioma of skin and subcutaneous tissue: Secondary | ICD-10-CM | POA: Diagnosis not present

## 2018-11-20 ENCOUNTER — Ambulatory Visit
Admission: RE | Admit: 2018-11-20 | Discharge: 2018-11-20 | Disposition: A | Discharge status: Home / Self Care | Payer: PPO | Source: Ambulatory Visit | Attending: Family Medicine | Admitting: Family Medicine

## 2018-11-20 DIAGNOSIS — F17218 Nicotine dependence, cigarettes, with other nicotine-induced disorders: Secondary | ICD-10-CM

## 2018-11-20 DIAGNOSIS — F17210 Nicotine dependence, cigarettes, uncomplicated: Secondary | ICD-10-CM | POA: Diagnosis not present

## 2018-12-06 ENCOUNTER — Other Ambulatory Visit (HOSPITAL_COMMUNITY): Payer: Self-pay | Admitting: Family Medicine

## 2018-12-06 DIAGNOSIS — R911 Solitary pulmonary nodule: Secondary | ICD-10-CM

## 2018-12-11 ENCOUNTER — Ambulatory Visit (HOSPITAL_COMMUNITY): Payer: PPO

## 2018-12-11 ENCOUNTER — Encounter (HOSPITAL_COMMUNITY)
Admission: RE | Admit: 2018-12-11 | Discharge: 2018-12-11 | Disposition: A | Discharge status: Home / Self Care | Payer: PPO | Source: Ambulatory Visit | Attending: Family Medicine | Admitting: Family Medicine

## 2018-12-11 DIAGNOSIS — R911 Solitary pulmonary nodule: Secondary | ICD-10-CM | POA: Diagnosis not present

## 2018-12-11 LAB — GLUCOSE, CAPILLARY: Glucose-Capillary: 103 mg/dL — ABNORMAL HIGH (ref 70–99)

## 2018-12-11 MED ORDER — FLUDEOXYGLUCOSE F - 18 (FDG) INJECTION
8.8000 | Freq: Once | INTRAVENOUS | Status: AC | PRN
  Administered 2018-12-11: 8.8 via INTRAVENOUS

## 2018-12-14 ENCOUNTER — Institutional Professional Consult (permissible substitution): Payer: PPO | Admitting: Cardiothoracic Surgery

## 2018-12-14 ENCOUNTER — Encounter: Payer: Self-pay | Admitting: Cardiothoracic Surgery

## 2018-12-14 ENCOUNTER — Other Ambulatory Visit: Payer: Self-pay | Admitting: *Deleted

## 2018-12-14 VITALS — BP 112/71 | HR 55 | Resp 20 | Ht 68.0 in | Wt 170.0 lb

## 2018-12-14 DIAGNOSIS — J984 Other disorders of lung: Secondary | ICD-10-CM | POA: Diagnosis not present

## 2018-12-14 DIAGNOSIS — R911 Solitary pulmonary nodule: Secondary | ICD-10-CM

## 2018-12-14 NOTE — Patient Instructions (Signed)
Pulmonary Nodule A pulmonary nodule is a small, round growth of tissue in the lung. It is sometimes referred to as a "shadow" or "spot on the lung." Nodules range in size from less than 1/5 of an inch (4 mm) to a little bigger than an inch (30 mm). Pulmonary nodules can be either noncancerous (benign) or cancerous (malignant). Most are noncancerous. Smaller nodules in people who do not smoke and do not have any other risk factors for lung cancer are more likely to be noncancerous. Larger, irregular nodules in people who smoke or who have a strong family history of lung cancer are more likely to be cancerous. What are the causes? This condition may be caused by:  A bacterial, fungal, or viral infection, such as tuberculosis. The infection is usually an old and inactive one.  A noncancerous mass of tissue.  Inflammation from conditions such as rheumatoid arthritis.  Abnormal blood vessels in the lungs.  Cancerous tissue, such as lung cancer or a cancer in another part of the body that has spread to the lung. What are the signs or symptoms? This condition usually does not cause symptoms. If symptoms appear, they are usually related to the underlying cause. For example, if the condition is caused by an infection, you may have a cough or fever. How is this diagnosed? This condition is usually diagnosed with an X-ray or CT scan. To help determine whether a pulmonary nodule is benign or malignant, your health care provider will:  Take your medical history.  Perform a physical exam.  Order tests, including: ? Blood tests. ? A skin test called a tuberculin test. This test is done to check if you have been exposed to the germ that causes tuberculosis. ? Chest X-rays. ? A CT scan. This test shows smaller pulmonary nodules more clearly and with more detail than an X-ray. ? A positron emission tomography (PET) scan. This test is done to check if the nodule is cancerous. During the test, a safe amount  of a radioactive substance is injected into the bloodstream. Then a picture is taken. ? Biopsy. In this test, a tiny piece of the pulmonary nodule is removed and then examined under a microscope. How is this treated? Treatment for this condition depends on whether the pulmonary nodule is malignant or benign as well as your risk of getting cancer.  Noncancerous nodules usually do not need to be treated, but they may need to be monitored with CT scans. If a CT scan shows that the pulmonary nodule got bigger, more tests may be done.  Some nodules need to be removed. If this is the case, you may have a procedure called a thoractomy. During the procedure, your health care provider will make an incision in your chest and remove the part of the lung where the nodule is located. Follow these instructions at home:   Take over-the-counter and prescription medicines only as told by your health care provider.  Do not use any products that contain nicotine or tobacco, such as cigarettes and e-cigarettes. If you need help quitting, ask your health care provider.  Keep all follow-up visits as told by your health care provider. This is important. Contact a health care provider if:  You have trouble breathing when you are active.  You feel sick or unusually tired.  You do not feel like eating.  You lose weight without trying.  You develop chills or night sweats. Get help right away if:  You cannot catch your breath.    You begin wheezing.  You cannot stop coughing.  You cough up blood.  You become dizzy or feel like you are going to faint.  You have sudden chest pain.  You have a fever or persistent symptoms for more than 2-3 days.  You have a fever and your symptoms suddenly get worse. Summary  A pulmonary nodule is a small, round growth of tissue in the lung. Most pulmonary nodules are noncancerous.  This condition is usually diagnosed with an X-ray or CT scan.  Common causes of  pulmonary nodules include infection, inflammation, and noncancerous growths.  Though less common, if a nodule is found to be cancerous, you will need specific diagnostic tests and treatment options as directed by your medical provider.  Treatment for this condition depends on whether the pulmonary nodule is benign or malignant as well as your risk of getting cancer. This information is not intended to replace advice given to you by your health care provider. Make sure you discuss any questions you have with your health care provider. Document Released: 09/18/2009 Document Revised: 12/20/2016 Document Reviewed: 12/20/2016 Elsevier Interactive Patient Education  2019 [place] Lung cancer is an abnormal growth of cancerous cells that forms a mass (malignant tumor) in a lung. There are several types of lung cancer. The types are based on the appearance of the tumor cells. The two most common types are:  Non-small cell lung cancer. This type of lung cancer is the most common type. Non-small cell lung cancers include squamous cell carcinoma, adenocarcinoma, and large cell carcinoma.  Small cell lung cancer. In this type of lung cancer, abnormal cells are smaller than those of non-small cell lung cancer. Small cell lung cancer gets worse (progresses) faster than non-small cell lung cancer. What are the causes? The most common cause of lung cancer is smoking tobacco. The second most common cause is exposure to a chemical called radon. What increases the risk? You are more likely to develop this condition if:  You smoke tobacco.  You have been exposed to: ? Secondhand tobacco smoke. ? Radon gas. ? Uranium. ? Asbestos. ? Arsenic in drinking water. ? Air pollution.  You have a family or personal history of lung cancer.  You have had lung radiation therapy in the past.  You are older than age 52. What are the signs or symptoms? In the early stages, you may not have any  symptoms. As the cancer progresses, symptoms may include:  A lasting cough, possibly with blood.  Fatigue.  Unexplained weight loss.  Shortness of breath.  Loud breathing (wheezing).  Chest pain.  Loss of appetite. Symptoms of advanced lung cancer include:  Hoarseness.  Bone or joint pain.  Weakness.  Change in the structure of the fingernails (clubbing), so that the nail looks like an upside-down spoon.  Swelling of the face or arms.  Inability to move the face (paralysis).  Drooping eyelids. How is this diagnosed? This condition may be diagnosed based on:  Your symptoms and medical history.  A physical exam.  A chest X-ray.  A CT scan.  Blood tests.  Sputum tests.  Removal of a sample of lung tissue (lung biopsy) for testing. Your cancer will be assessed (staged) to determine how severe it is and how much it has spread (metastasized). How is this treated? Treatment depends on the type and stage of your cancer. Treatment may include one or more of the following:  Surgery to remove as much of the cancer  as possible. Lymph nodes in the area may be removed and tested for cancer as well.  Medicines that kill cancer cells (chemotherapy).  High-energy rays that kill cancer cells (radiation therapy).  Chemotherapy. This treatment uses medicines to destroy cancer cells.  Targeted therapy. This targets specific parts of cancer cells and the area around them to block the growth and spread of the cancer. Targeted therapy can help limit the damage to healthy cells. Follow these instructions at home: Eating and drinking  Some of your treatments might affect your appetite. If you are having problems eating, or if you do not have an appetite, meet with a dietitian.  If you have side effects that affect your appetite, it may help to: ? Eat smaller meals and snacks often. ? Drink high-nutrition and high-calorie shakes or supplements. ? Eat bland and soft foods that  are easy to eat. ? Avoid eating foods that are hot, spicy, or hard to swallow. General instructions   Do not use any products that contain nicotine or tobacco, such as cigarettes and e-cigarettes. If you need help quitting, ask your health care provider.  Do not drink alcohol.  If you are admitted to the hospital, make sure your cancer specialist (oncologist) is aware. Your cancer may affect your treatment for other conditions.  Take over-the-counter and prescription medicines only as told by your health care provider.  Consider joining a support group for people who have been diagnosed with lung cancer.  Work with your health care provider to manage any side effects of treatment.  Keep all follow-up visits as told by your health care provider. This is important. Where to find more information  American Cancer Society: https://www.cancer.[place] ([place]): https://www.cancer.gov Contact a health care provider if you:  Lose weight without trying.  Have a persistent cough and wheezing.  Feel short of breath.  Get tired easily.  Have bone or joint pain.  Have difficulty swallowing.  Notice that your voice is changing or getting hoarse.  Have pain that does not get better with medicine. Get help right away if you:  Cough up blood.  Have new breathing problems.  Have chest pain.  Have a fever.  Have swelling in an ankle, leg, or arm, or the face or neck.  Have paralysis in your face.  Are very confused.  Have a drooping eyelid. Summary  Lung cancer is an abnormal growth of cancerous cells that forms a mass (malignant tumor) in a lung.  There are several types of lung cancer. The types are based on the appearance of the tumor cells. The two most common types are non-small cell and small cell.  The most common cause of lung cancer is smoking tobacco.  Early symptoms include a lasting cough, possibly with blood, and fatigue, unexplained  weight loss, and shortness of breath.  After diagnosis, treatment depends on the type and stage of your cancer. This information is not intended to replace advice given to you by your health care provider. Make sure you discuss any questions you have with your health care provider. Document Released: 02/27/2001 Document Revised: 09/28/2017 Document Reviewed: 09/28/2017 Elsevier Interactive Patient Education  2019 [employer].

## 2018-12-14 NOTE — Progress Notes (Signed)
[address]       [place] [address]             [phone]                    Jack Taylor [facility] Medical Record #[id number] Date of Birth: 06-03-1946  Referring: [name], MD Primary Care: [name], MD Primary Cardiologist: No primary care provider on file.  Chief Complaint:    Chief Complaint  Patient presents with  . Lung Lesion    Surgical eval, Chest CT 11/20/18, PET Scan 12/11/2018, PFT's 04/20/2016    History of Present Illness:    Judson Roch 73 y.o. male is seen in the office  today for       Current Activity/ Functional Status:  Patient is independent with mobility/ambulation, transfers, ADL's, IADL's.   Zubrod Score: At the time of surgery this patient's most appropriate activity status/level should be described as: []     0    Normal activity, no symptoms [x]     1    Restricted in physical strenuous activity but ambulatory, able to do out light work []     2    Ambulatory and capable of self care, unable to do work activities, up and about               >50 % of waking hours                              []     3    Only limited self care, in bed greater than 50% of waking hours []     4    Completely disabled, no self care, confined to bed or chair []     5    Moribund   Past Medical History:  Diagnosis Date  . Adenomatous colon polyp   . Anxiety   . Blood transfusion without reported diagnosis   . Cancer ([place])    Lung; 11/2018  . COPD (chronic obstructive pulmonary disease) ([place])   . Gastric ulcer   . GERD (gastroesophageal reflux disease)   . High cholesterol   . Hypertension     Past Surgical History:  Procedure Laterality Date  . COLONOSCOPY  2008  . ESOPHAGOGASTRODUODENOSCOPY  11/23/2011   Procedure: ESOPHAGOGASTRODUODENOSCOPY (EGD);  Surgeon: Owens Loffler, MD;  Location: [facility];  Service: Endoscopy;  Laterality: N/A;  . UPPER GASTROINTESTINAL ENDOSCOPY  11/23/11    Family History    Problem Relation Age of Onset  . Stomach cancer Mother   . Heart disease Father   . Colon cancer Neg Hx      Social History   Tobacco Use  Smoking Status Former Smoker  . Packs/day: 0.25  . Years: 5.00  . Pack years: 1.25  . Types: Cigarettes  . Last attempt to quit: 11/04/2018  . Years since quitting: 0.1  Smokeless Tobacco Never Used  Tobacco Comment   recently quit when diagnosed with lung cancer.     Social History   Substance and Sexual Activity  Alcohol Use No     No Known Allergies  Current Outpatient Medications  Medication Sig Dispense Refill  . albuterol (PROVENTIL HFA;VENTOLIN HFA) 108 (90 Base) MCG/ACT inhaler Inhale 2 puffs into the lungs every 6 (six) hours as needed for wheezing or shortness of breath. Reported on 06/02/2016    . amLODipine (NORVASC) 10 MG  tablet Take 10 mg by mouth daily.     Marland Kitchen esomeprazole (NEXIUM) 40 MG capsule Take 40 mg by mouth daily.     Marland Kitchen losartan (COZAAR) 50 MG tablet Take 50 mg by mouth daily.     Marland Kitchen tiotropium (SPIRIVA) 18 MCG inhalation capsule Place 1 capsule (18 mcg total) into inhaler and inhale daily. (Patient not taking: Reported on 12/17/2018) 90 capsule 3  . rosuvastatin (CRESTOR) 10 MG tablet Take 10 mg by mouth daily.    . Tiotropium Bromide-Olodaterol 2.5-2.5 MCG/ACT AERS Inhale 1 puff into the lungs daily.     Current Facility-Administered Medications  Medication Dose Route Frequency Provider Last Rate Last Dose  . 0.9 %  sodium chloride infusion  500 mL Intravenous Continuous Milus Banister, MD        Pertinent items are noted in HPI.   Review of Systems:     Cardiac Review of Systems: [Y] = yes  or   [ N ] = no   Chest Pain n]  Resting SOB [ n  ] Exertional SOB  [ y ]  Orthopnea [ n ]   Pedal Edema [ n  ]    Palpitations [n  ] Syncope  [ n ]   Presyncope [ n  ]   General Review of Systems: [Y] = yes [  ]=no Constitional: recent weight change [n  ];  Wt loss over the last 3 months [   ] anorexia [  ];  fatigue [  ]; nausea [  ]; night sweats [  ]; fever [  ]; or chills [  ];           Eye : blurred vision [  ]; diplopia [   ]; vision changes [  ];  Amaurosis fugax[  ]; Resp: cough [  ];  wheezing[  ];  hemoptysis[  ]; shortness of breath[  ]; paroxysmal nocturnal dyspnea[  ]; dyspnea on exertion[  ]; or orthopnea[  ];  GI:  gallstones[  ], vomiting[  ];  dysphagia[  ]; melena[  ];  hematochezia [  ]; heartburn[  ];   Hx of  Colonoscopy[  ]; GU: kidney stones [  ]; hematuria[  ];   dysuria [  ];  nocturia[  ];  history of     obstruction [  ]; urinary frequency [  ]             Skin: rash, swelling[  ];, hair loss[  ];  peripheral edema[  ];  or itching[  ]; Musculosketetal: myalgias[  ];  joint swelling[  ];  joint erythema[  ];  joint pain[  ];  back pain[  ];  Heme/Lymph: bruising[  ];  bleeding[  ];  anemia[  ];  Neuro: TIA[  ];  headaches[  ];  stroke[  ];  vertigo[  ];  seizures[  ];   paresthesias[  ];  difficulty walking[  ];  Psych:depression[  ]; anxiety[  ];  Endocrine: diabetes[  ];  thyroid dysfunction[  ];  Immunizations: Flu up to date [ y ]; Pneumococcal up to date [facility].[place]  ];  Other:     PHYSICAL EXAMINATION: BP 112/71   Pulse (!) 55   Resp 20   Ht 5\' 8"  (1.727 m)   Wt 170 lb (77.1 kg)   SpO2 97% Comment: RA  BMI 25.85 kg/m  General appearance: alert, cooperative, appears older than stated age and distracted Head: Normocephalic, without  obvious abnormality, atraumatic Neck: no adenopathy, no carotid bruit, no JVD, supple, symmetrical, trachea midline and thyroid not enlarged, symmetric, no tenderness/mass/nodules Lymph nodes: Cervical, supraclavicular, and axillary nodes normal. Resp: diminished breath sounds bibasilar Back: symmetric, no curvature. ROM normal. No CVA tenderness. Cardio: regular rate and rhythm, S1, S2 normal, no murmur, click, rub or gallop GI: soft, non-tender; bowel sounds normal; no masses,  no organomegaly Extremities: extremities normal,  atraumatic, no cyanosis or edema and Homans sign is negative, no sign of DVT Neurologic: Grossly normal But slow to answer questions, turns to his wife for her to answer   Diagnostic Studies & Laboratory data:     Recent Radiology Findings:   Nm Pet Image Initial (pi) Skull Base To Thigh  Result Date: 12/11/2018 CLINICAL DATA:  Initial treatment strategy for pulmonary nodule. EXAM: NUCLEAR MEDICINE PET SKULL BASE TO THIGH TECHNIQUE: 8.8 mCi F-18 FDG was injected intravenously. Full-ring PET imaging was performed from the skull base to thigh after the radiotracer. CT data was obtained and used for attenuation correction and anatomic localization. Fasting blood glucose: 103 mg/dl COMPARISON:  Chest CT 12 17 19  FINDINGS: Mediastinal blood pool activity: SUV max 2.2 NECK: No hypermetabolic lymph nodes in the neck. Incidental CT findings: none CHEST: Spiculated nodule in the LEFT upper lobe measuring 15 mm is unchanged in size from 15 mm in short interval (CT 11/20/2018). This nodule is intensely hypermetabolic with SUV max equal 7.6. No additional hypermetabolic pulmonary nodules. There is a hypermetabolic lymph node at the LEFT hilum with SUV max equal 8.1. This lymph node is not well-defined on the noncontrast CT. Small LEFT lower paratracheal lymph node measuring only several mm (image 74/5) has significant radiotracer activity for size with SUV max equal 4.2. No contralateral hypermetabolic lymph nodes. Incidental CT findings: Coronary artery calcification and aortic atherosclerotic calcification. ABDOMEN/PELVIS: No abnormal hypermetabolic activity within the liver, pancreas, adrenal glands, or spleen. No hypermetabolic lymph nodes in the abdomen or pelvis. Incidental CT findings: Enlarged prostate gland. Atherosclerotic calcification of the aorta. SKELETON: No focal hypermetabolic activity to suggest skeletal metastasis. Incidental CT findings: none IMPRESSION: 1. Hypermetabolic LEFT upper lobe pulmonary  nodule. Hypermetabolic LEFT hilar lymph node and potential ipsilateral mediastinal lymph node. 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 ( T1b N2 m0). 2. If multidisciplinary follow up management is desired, this is available in the [facility] through the [facility] [id number]. These results will be called to the ordering clinician or representative by the Radiologist Assistant, and communication documented in the PACS or zVision Dashboard. Electronically Signed   By: Suzy Bouchard M.D.   On: 12/11/2018 14:39   Ct Chest Lung Ca Screen Low Dose W/o Cm  Result Date: 11/20/2018 CLINICAL DATA:  73 year old asymptomatic male current smoker with 49 pack-year smoking history. EXAM: CT CHEST WITHOUT CONTRAST LOW-DOSE FOR LUNG CANCER SCREENING TECHNIQUE: Multidetector CT imaging of the chest was performed following the standard protocol without IV contrast. COMPARISON:  11/22/2011 chest CT angiogram. 08/27/2015 chest radiograph. FINDINGS: Cardiovascular: Normal heart size. No significant pericardial effusion/thickening. Three-vessel coronary atherosclerosis. Atherosclerotic nonaneurysmal thoracic aorta. Normal caliber pulmonary arteries. Mediastinum/Nodes: No discrete thyroid nodules. Unremarkable esophagus. No pathologically enlarged axillary, mediastinal or hilar lymph nodes, noting limited sensitivity for the detection of hilar adenopathy on this noncontrast study. Lungs/Pleura: No pneumothorax. No pleural effusion. Mild centrilobular emphysema with diffuse bronchial wall thickening. No acute consolidative airspace disease or lung masses. Several solid pulmonary nodules throughout both lungs. The dominant pulmonary nodule is a  spiculated solid left upper lobe pulmonary nodule measuring 15.1 mm in volume derived mean diameter (series 9/image 171). Upper abdomen: No acute abnormality. Musculoskeletal: No aggressive appearing focal osseous lesions. Moderate thoracic  spondylosis. IMPRESSION: 1. Lung-RADS 4B, suspicious. Dominant spiculated solid 15.1 mm left upper lobe pulmonary nodule, highly suspicious for primary bronchogenic carcinoma. Additional imaging evaluation or consultation with Pulmonology or Thoracic Surgery recommended. 2. No thoracic adenopathy evident on this noncontrast low-dose CT. 3. Three-vessel coronary atherosclerosis. Aortic Atherosclerosis (ICD10-I70.0) and Emphysema (ICD10-J43.9). These results will be called to the ordering clinician or representative by the Radiologist Assistant, and communication documented in the PACS or zVision Dashboard. Electronically Signed   By: Ilona Sorrel M.D.   On: 11/20/2018 14:47     I have independently reviewed the above radiology studies  and reviewed the findings with the patient.   Recent Lab Findings: Lab Results  Component Value Date   WBC 6.0 12/17/2018   HGB 13.9 12/17/2018   HCT 41.1 12/17/2018   PLT 142 (L) 12/17/2018   GLUCOSE 127 (H) 12/17/2018   ALT 26 12/17/2018   AST 19 12/17/2018   NA 140 12/17/2018   K 4.0 12/17/2018   CL 106 12/17/2018   CREATININE 1.55 (H) 12/17/2018   BUN 17 12/17/2018   CO2 26 12/17/2018   INR 1.03 12/17/2018   PFT's  2017 FEV1  1.3 40% DLCO 19.71  62%  Chronic Kidney Disease   Stage I     GFR >90  Stage II    GFR 60-89  Stage IIIA GFR 45-59  Stage IIIB GFR 30-44  Stage IV   GFR 15-29  Stage V    GFR  <15  Lab Results  Component Value Date   CREATININE 1.55 (H) 12/17/2018   Estimated Creatinine Clearance: 41.7 mL/min (A) (by C-G formula based on SCr of 1.55 mg/dL (H)).  Assessment / Plan:   1/Left upper lobe lung mass - radiographic evidence of clinical stage III lung cancer c (T1b,N2,M0)- I have reviewed the clinical diagnosis with the patient and his wife. I have recommended proceeding with bronch, EBUS and navigation bronchoscopy to stage and get a tissue dx. Risk of biopsy have been explained. Patient agreeable to move ahead quickly  2/  Followed by pulmonary for underlying COPD  3/ chronic kidney disease Stage iiiB    I  spent 60 minutes with  the patient face to face and greater then 50% of the time was spent in counseling and coordination of care.    Grace Isaac MD      [address] [place] [address] Office [phone]   [facility] [phone]  12/17/2018 4:38 PM

## 2018-12-17 ENCOUNTER — Encounter (HOSPITAL_COMMUNITY): Payer: Self-pay

## 2018-12-17 ENCOUNTER — Ambulatory Visit (HOSPITAL_COMMUNITY)
Admission: RE | Admit: 2018-12-17 | Discharge: 2018-12-17 | Disposition: A | Discharge status: Home / Self Care | Payer: PPO | Source: Ambulatory Visit | Attending: Cardiothoracic Surgery | Admitting: Cardiothoracic Surgery

## 2018-12-17 ENCOUNTER — Encounter (HOSPITAL_COMMUNITY)
Admission: RE | Admit: 2018-12-17 | Discharge: 2018-12-17 | Disposition: A | Discharge status: Home / Self Care | Payer: PPO | Source: Ambulatory Visit | Attending: Cardiothoracic Surgery | Admitting: Cardiothoracic Surgery

## 2018-12-17 ENCOUNTER — Other Ambulatory Visit: Payer: Self-pay

## 2018-12-17 DIAGNOSIS — R911 Solitary pulmonary nodule: Secondary | ICD-10-CM

## 2018-12-17 HISTORY — DX: Malignant (primary) neoplasm, unspecified: C80.1

## 2018-12-17 LAB — COMPREHENSIVE METABOLIC PANEL
ALT: 26 U/L (ref 0–44)
AST: 19 U/L (ref 15–41)
Albumin: 3.7 g/dL (ref 3.5–5.0)
Alkaline Phosphatase: 49 U/L (ref 38–126)
Anion gap: 8 (ref 5–15)
BUN: 17 mg/dL (ref 8–23)
CO2: 26 mmol/L (ref 22–32)
Calcium: 9.2 mg/dL (ref 8.9–10.3)
Chloride: 106 mmol/L (ref 98–111)
Creatinine, Ser: 1.55 mg/dL — ABNORMAL HIGH (ref 0.61–1.24)
GFR calc Af Amer: 51 mL/min — ABNORMAL LOW (ref >60)
GFR calc non Af Amer: 44 mL/min — ABNORMAL LOW (ref >60)
Glucose, Bld: 127 mg/dL — ABNORMAL HIGH (ref 70–99)
Potassium: 4 mmol/L (ref 3.5–5.1)
Sodium: 140 mmol/L (ref 135–145)
Total Bilirubin: 1 mg/dL (ref 0.3–1.2)
Total Protein: 6.5 g/dL (ref 6.5–8.1)

## 2018-12-17 LAB — CBC
HCT: 41.1 % (ref 39.0–52.0)
Hemoglobin: 13.9 g/dL (ref 13.0–17.0)
MCH: 31.4 pg (ref 26.0–34.0)
MCHC: 33.8 g/dL (ref 30.0–36.0)
MCV: 93 fL (ref 80.0–100.0)
Platelets: 142 10*3/uL — ABNORMAL LOW (ref 150–400)
RBC: 4.42 MIL/uL (ref 4.22–5.81)
RDW: 12.4 % (ref 11.5–15.5)
WBC: 6 10*3/uL (ref 4.0–10.5)
nRBC: 0 % (ref 0.0–0.2)

## 2018-12-17 LAB — APTT: aPTT: 33 seconds (ref 24–36)

## 2018-12-17 LAB — PROTIME-INR
INR: 1.03
Prothrombin Time: 13.4 seconds (ref 11.4–15.2)

## 2018-12-17 NOTE — Pre-Procedure Instructions (Signed)
Jack Taylor  12/17/2018      [facility] [address] [place] - [address] [place] [address] Phone: [phone] Fax: [phone]    Your procedure is scheduled on Tuesday, January 14th.  Report to [facility] Admitting at 5:30 A.M.  Call this number if you have problems the morning of surgery:  [phone]   Remember:  Do not eat or drink after midnight.    Take these medicines the morning of surgery with A SIP OF WATER  amLODipine (NORVASC) esomeprazole (NEXIUM) Inhalers; please bring your albuterol inhaler with you to the hospital on the day of surgery.   As of today, STOP taking any Aspirin (unless otherwise instructed by your surgeon), Aleve, Naproxen, Ibuprofen, Motrin, Advil, Goody's, BC's, all herbal medications, fish oil, and all vitamins.     Do not wear jewelry.  Do not wear lotions, powders, or colognes, or deodorant.  Men may shave face and neck.  Do not bring valuables to the hospital.  [facility] is not responsible for any belongings or valuables.  Contacts, dentures or bridgework may not be worn into surgery.  Leave your suitcase in the car.  After surgery it may be brought to your room.  For patients admitted to the hospital, discharge time will be determined by your treatment team.  Patients discharged the day of surgery will not be allowed to drive home.   Special instructions:   [place]- Preparing For Surgery  Before surgery, you can play an important role. Because skin is not sterile, your skin needs to be as free of germs as possible. You can reduce the number of germs on your skin by washing with CHG (chlorahexidine gluconate) Soap before surgery.  CHG is an antiseptic cleaner which kills germs and bonds with the skin to continue killing germs even after washing.    Oral Hygiene is also important to reduce your risk of infection.  Remember - BRUSH YOUR TEETH THE MORNING OF SURGERY WITH YOUR  REGULAR TOOTHPASTE  Please do not use if you have an allergy to CHG or antibacterial soaps. If your skin becomes reddened/irritated stop using the CHG.  Do not shave (including legs and underarms) for at least 48 hours prior to first CHG shower. It is OK to shave your face.  Please follow these instructions carefully.   1. Shower the NIGHT BEFORE SURGERY and the MORNING OF SURGERY with CHG.   2. If you chose to wash your hair, wash your hair first as usual with your normal shampoo.  3. After you shampoo, rinse your hair and body thoroughly to remove the shampoo.  4. Use CHG as you would any other liquid soap. You can apply CHG directly to the skin and wash gently with a scrungie or a clean washcloth.   5. Apply the CHG Soap to your body ONLY FROM THE NECK DOWN.  Do not use on open wounds or open sores. Avoid contact with your eyes, ears, mouth and genitals (private parts). Wash Face and genitals (private parts)  with your normal soap.  6. Wash thoroughly, paying special attention to the area where your surgery will be performed.  7. Thoroughly rinse your body with warm water from the neck down.  8. DO NOT shower/wash with your normal soap after using and rinsing off the CHG Soap.  9. Pat yourself dry with a CLEAN TOWEL.  10. Wear CLEAN PAJAMAS to bed the night before  surgery, wear comfortable clothes the morning of surgery  11. Place CLEAN SHEETS on your bed the night of your first shower and DO NOT SLEEP WITH PETS.    Day of Surgery:  Do not apply any deodorants/lotions.  Please wear clean clothes to the hospital/surgery center.   Remember to brush your teeth WITH YOUR REGULAR TOOTHPASTE.   Please read over the following fact sheets that you were given.

## 2018-12-17 NOTE — Progress Notes (Signed)
Anesthesia Chart Review:  Case:  [id number] Date/Time:  12/18/18 0715   Procedures:      VIDEO BRONCHOSCOPY WITH ENDOBRONCHIAL ULTRASOUND (N/A )     VIDEO BRONCHOSCOPY WITH ENDOBRONCHIAL NAVIGATION (N/A )   Anesthesia type:  General   Pre-op diagnosis:  PULMONARY NODULE   Location:  [facility] OR ROOM 14 / [place] [facility]   Surgeon:  Grace Isaac, MD      DISCUSSION: Patient is a 73 year old male scheduled for the above procedure.  History includes former smoker (quit 11/04/18), HTN, hypercholesterolemia, GERD, COPD, pulmonary nodule (hypermetabolic 12/12/59). Labs trends suggest CKD as well. Cr 1.55 preoperatively and appears stable.  He saw cardiologist Dr. Einar Gip last in 12/2016 with PRN follow-up recommended. See Providers section.  If no acute changes then I anticipate that he can proceed as planned. 12/17/18 CXR report is still pending.   VS: BP 130/60   Pulse (!) 57   Temp 36.6 C   Resp 20   Ht 5\' 8"  (1.727 m)   Wt 78 kg   SpO2 97%   BMI 26.15 kg/m    PROVIDERS: [name], MD is PCP ([facility]). Kela Millin, MD is cardiologist [facility]). Last visit 12/16/16 for follow-up dyspnea and coronary calcifications on prior CT scan. Testing as outlined below. He initially saw patient more frequently "to improve compliance." Smoking cessation encouraged. And breathing improved on bronchodilator therapy. PRN cardiology follow-up with continued management by primary care recommended at his last visit.     LABS: Preoperative labs noted. Cr 1.55--previously 1.30-1.40 range 11/2011 and 1.44-1.52 within the past 6-7 months (see [place]). A1c 6.2 on 11/09/18 (Care Everywhere). (all labs ordered are listed, but only abnormal results are displayed)  Labs Reviewed  CBC - Abnormal; Notable for the following components:      Result Value   Platelets 142 (*)    All other components within normal limits  COMPREHENSIVE METABOLIC PANEL - Abnormal; Notable  for the following components:   Glucose, Bld 127 (*)    Creatinine, Ser 1.55 (*)    GFR calc non Af Amer 44 (*)    GFR calc Af Amer 51 (*)    All other components within normal limits  APTT  PROTIME-INR   PFTs 04/20/16: PRE: FVC 2.15 (49%). FEV1 1.30 (40%). POST FVC 2.65 (61%). FEV1 1.59 (50%). DLCO unc 19.71 (62%).   IMAGES:  CXR 12/17/18: Report Pending.  PET scan 12/11/18: IMPRESSION: 1. Hypermetabolic LEFT upper lobe pulmonary nodule. Hypermetabolic LEFT hilar lymph node and potential ipsilateral mediastinal lymph node. 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 ( T1b N2 m0).  CT chest 11/20/18: IMPRESSION: 1. Lung-RADS 4B, suspicious. Dominant spiculated solid 15.1 mm left upper lobe pulmonary nodule, highly suspicious for primary bronchogenic carcinoma. Additional imaging evaluation or consultation with Pulmonology or Thoracic Surgery recommended. 2. No thoracic adenopathy evident on this noncontrast low-dose CT. 3. Three-vessel coronary atherosclerosis. Aortic Atherosclerosis (ICD10-I70.0) and Emphysema (ICD10-J43.9).   EKG: 12/17/18: SB at 50 bpm. First degree AV block.   CV: Carotid US 09/01/15 [facility]): Conclusions: No hemodynamically significant arterial disease in the internal carotid artery bilaterally. Moderate amount of heterogeneous plaque noted in the right artery and mild plaque left.  Echo 01/01/15  [facility]): Conclusion:  1. Left ventricle cavity is normal is size. Mild concentric hypertrophy of the left ventricle. Normal global wall notion. Normal diastolic filling pattern. Calculated EF 58%. 2. Mild mitral regurgitation. 3. Trace tricuspid regurgitation. 4.  Trace pulmonic regurgitation.  Nuclear stress test 12/03/14 [facility]): Impression: Myocardial perfusion imaging is normal. Overall the LV systolic function is normal without regional wall motion abnormalities. LVEF 67%.  He reported  "clean" cath over 20 years  ago.   Past Medical History:  Diagnosis Date  . Adenomatous colon polyp   . Anxiety   . Blood transfusion without reported diagnosis   . Cancer ([place])    Lung; 11/2018  . COPD (chronic obstructive pulmonary disease) ([place])   . Gastric ulcer   . GERD (gastroesophageal reflux disease)   . High cholesterol   . Hypertension     Past Surgical History:  Procedure Laterality Date  . COLONOSCOPY  2008  . ESOPHAGOGASTRODUODENOSCOPY  11/23/2011   Procedure: ESOPHAGOGASTRODUODENOSCOPY (EGD);  Surgeon: Owens Loffler, MD;  Location: [facility];  Service: Endoscopy;  Laterality: N/A;  . UPPER GASTROINTESTINAL ENDOSCOPY  11/23/11    MEDICATIONS: . albuterol (PROVENTIL HFA;VENTOLIN HFA) 108 (90 Base) MCG/ACT inhaler  . amLODipine (NORVASC) 10 MG tablet  . esomeprazole (NEXIUM) 40 MG capsule  . losartan (COZAAR) 50 MG tablet  . rosuvastatin (CRESTOR) 10 MG tablet  . tiotropium (SPIRIVA) 18 MCG inhalation capsule  . Tiotropium Bromide-Olodaterol 2.5-2.5 MCG/ACT AERS   . 0.9 %  sodium chloride infusion    Myra Gianotti, PA-C Surgical Short Stay/Anesthesiology [facility] Phone [phone] [facility] Phone [phone] 12/17/2018 8:25 PM

## 2018-12-17 NOTE — Progress Notes (Signed)
PCP -Dr. Terrill Mohr  Cardiologist - Dr. Einar Gip  Chest x-ray - 12/17/18 EKG - 12/17/18 Stress Test - 2015-requested from [facility] ECHO - 2012-requested from [facility] Cardiac Cath - 20+ years ago; reported to be clean.   Sleep Study - denies  Labs: CBC, CMO, PT/INR, APTT  Aspirin Instructions:N/A  Anesthesia review: Yes, requested records from [facility].  Patient denies shortness of breath, fever, cough and chest pain at PAT appointment   Patient verbalized understanding of instructions that were given to them at the PAT appointment. Patient was also instructed that they will need to review over the PAT instructions again at home before surgery.

## 2018-12-17 NOTE — Anesthesia Preprocedure Evaluation (Addendum)
Anesthesia Evaluation  Patient identified by MRN, date of birth, ID band Patient awake    Reviewed: Allergy & Precautions, H&P , NPO status , Patient's Chart, lab work & pertinent test results  Airway Mallampati: II   Neck ROM: full    Dental   Pulmonary COPD, former smoker,    breath sounds clear to auscultation       Cardiovascular hypertension,  Rhythm:regular Rate:Normal     Neuro/Psych PSYCHIATRIC DISORDERS Anxiety    GI/Hepatic PUD, GERD  ,  Endo/Other    Renal/GU      Musculoskeletal   Abdominal   Peds  Hematology   Anesthesia Other Findings   Reproductive/Obstetrics                            Anesthesia Physical Anesthesia Plan  ASA: III  Anesthesia Plan: General   Post-op Pain Management:    Induction: Intravenous  PONV Risk Score and Plan: 2 and Ondansetron, Dexamethasone and Treatment may vary due to age or medical condition  Airway Management Planned: Oral ETT  Additional Equipment:   Intra-op Plan:   Post-operative Plan: Extubation in OR  Informed Consent: I have reviewed the patients History and Physical, chart, labs and discussed the procedure including the risks, benefits and alternatives for the proposed anesthesia with the patient or authorized representative who has indicated his/her understanding and acceptance.     Plan Discussed with: CRNA, Anesthesiologist and Surgeon  Anesthesia Plan Comments: (PAT note written 12/17/2018 by Myra Gianotti, PA-C. )       Anesthesia Quick Evaluation

## 2018-12-18 ENCOUNTER — Ambulatory Visit (HOSPITAL_COMMUNITY): Payer: PPO | Admitting: Anesthesiology

## 2018-12-18 ENCOUNTER — Ambulatory Visit (HOSPITAL_COMMUNITY): Payer: PPO | Admitting: Vascular Surgery

## 2018-12-18 ENCOUNTER — Encounter (HOSPITAL_COMMUNITY): Payer: Self-pay | Admitting: Surgery

## 2018-12-18 ENCOUNTER — Ambulatory Visit (HOSPITAL_COMMUNITY): Payer: PPO

## 2018-12-18 ENCOUNTER — Ambulatory Visit (HOSPITAL_COMMUNITY)
Admission: RE | Admit: 2018-12-18 | Discharge: 2018-12-18 | Disposition: A | Discharge status: Home / Self Care | Payer: PPO | Attending: Cardiothoracic Surgery | Admitting: Cardiothoracic Surgery

## 2018-12-18 ENCOUNTER — Encounter (HOSPITAL_COMMUNITY)
Admission: RE | Disposition: A | Discharge status: Home / Self Care | Payer: Self-pay | Source: Home / Self Care | Attending: Cardiothoracic Surgery

## 2018-12-18 DIAGNOSIS — R911 Solitary pulmonary nodule: Secondary | ICD-10-CM | POA: Diagnosis not present

## 2018-12-18 DIAGNOSIS — Z87891 Personal history of nicotine dependence: Secondary | ICD-10-CM | POA: Insufficient documentation

## 2018-12-18 DIAGNOSIS — N183 Chronic kidney disease, stage 3 (moderate): Secondary | ICD-10-CM | POA: Insufficient documentation

## 2018-12-18 DIAGNOSIS — C3412 Malignant neoplasm of upper lobe, left bronchus or lung: Secondary | ICD-10-CM | POA: Insufficient documentation

## 2018-12-18 DIAGNOSIS — J449 Chronic obstructive pulmonary disease, unspecified: Secondary | ICD-10-CM | POA: Insufficient documentation

## 2018-12-18 DIAGNOSIS — I10 Essential (primary) hypertension: Secondary | ICD-10-CM | POA: Diagnosis not present

## 2018-12-18 DIAGNOSIS — I129 Hypertensive chronic kidney disease with stage 1 through stage 4 chronic kidney disease, or unspecified chronic kidney disease: Secondary | ICD-10-CM | POA: Insufficient documentation

## 2018-12-18 DIAGNOSIS — Z419 Encounter for procedure for purposes other than remedying health state, unspecified: Secondary | ICD-10-CM

## 2018-12-18 HISTORY — PX: VIDEO BRONCHOSCOPY WITH ENDOBRONCHIAL NAVIGATION: SHX6175

## 2018-12-18 HISTORY — PX: VIDEO BRONCHOSCOPY WITH ENDOBRONCHIAL ULTRASOUND: SHX6177

## 2018-12-18 HISTORY — PX: LUNG BIOPSY: SHX5088

## 2018-12-18 SURGERY — BRONCHOSCOPY, WITH EBUS
Anesthesia: General | Site: Chest

## 2018-12-18 MED ORDER — 0.9 % SODIUM CHLORIDE (POUR BTL) OPTIME
TOPICAL | Status: DC | PRN
  Administered 2018-12-18: 1000 mL

## 2018-12-18 MED ORDER — EPINEPHRINE PF 1 MG/ML IJ SOLN
INTRAMUSCULAR | Status: DC | PRN
  Administered 2018-12-18: 1 mg via ENDOTRACHEOPULMONARY

## 2018-12-18 MED ORDER — EPINEPHRINE PF 1 MG/ML IJ SOLN
INTRAMUSCULAR | Status: AC
  Filled 2018-12-18: qty 1

## 2018-12-18 MED ORDER — PROPOFOL 10 MG/ML IV BOLUS
INTRAVENOUS | Status: AC
  Filled 2018-12-18: qty 40

## 2018-12-18 MED ORDER — ROCURONIUM BROMIDE 50 MG/5ML IV SOSY
PREFILLED_SYRINGE | INTRAVENOUS | Status: AC
  Filled 2018-12-18: qty 10

## 2018-12-18 MED ORDER — SUGAMMADEX SODIUM 200 MG/2ML IV SOLN
INTRAVENOUS | Status: DC | PRN
  Administered 2018-12-18: 200 mg via INTRAVENOUS

## 2018-12-18 MED ORDER — ONDANSETRON HCL 4 MG/2ML IJ SOLN: 4.0000 mg | Freq: Four times a day (QID) | INTRAMUSCULAR | Status: DC | PRN

## 2018-12-18 MED ORDER — LACTATED RINGERS IV SOLN
INTRAVENOUS | Status: DC | PRN
  Administered 2018-12-18: 07:00:00 via INTRAVENOUS

## 2018-12-18 MED ORDER — FENTANYL CITRATE (PF) 100 MCG/2ML IJ SOLN: 25.0000 ug | INTRAMUSCULAR | Status: DC | PRN

## 2018-12-18 MED ORDER — ONDANSETRON HCL 4 MG/2ML IJ SOLN
INTRAMUSCULAR | Status: AC
  Filled 2018-12-18: qty 2

## 2018-12-18 MED ORDER — DEXAMETHASONE SODIUM PHOSPHATE 10 MG/ML IJ SOLN
INTRAMUSCULAR | Status: DC | PRN
  Administered 2018-12-18: 10 mg via INTRAVENOUS

## 2018-12-18 MED ORDER — LIDOCAINE 2% (20 MG/ML) 5 ML SYRINGE
INTRAMUSCULAR | Status: AC
  Filled 2018-12-18: qty 5

## 2018-12-18 MED ORDER — DEXAMETHASONE SODIUM PHOSPHATE 10 MG/ML IJ SOLN
INTRAMUSCULAR | Status: AC
  Filled 2018-12-18: qty 1

## 2018-12-18 MED ORDER — ONDANSETRON HCL 4 MG/2ML IJ SOLN
INTRAMUSCULAR | Status: DC | PRN
  Administered 2018-12-18: 4 mg via INTRAVENOUS

## 2018-12-18 MED ORDER — MIDAZOLAM HCL 2 MG/2ML IJ SOLN
INTRAMUSCULAR | Status: AC
  Filled 2018-12-18: qty 2

## 2018-12-18 MED ORDER — OXYCODONE HCL 5 MG/5ML PO SOLN: 5.0000 mg | Freq: Once | ORAL | Status: DC | PRN

## 2018-12-18 MED ORDER — FENTANYL CITRATE (PF) 100 MCG/2ML IJ SOLN
INTRAMUSCULAR | Status: DC | PRN
  Administered 2018-12-18: 50 ug via INTRAVENOUS
  Administered 2018-12-18 (×4): 25 ug via INTRAVENOUS

## 2018-12-18 MED ORDER — PROPOFOL 10 MG/ML IV BOLUS
INTRAVENOUS | Status: DC | PRN
  Administered 2018-12-18: 120 mg via INTRAVENOUS

## 2018-12-18 MED ORDER — MIDAZOLAM HCL 5 MG/5ML IJ SOLN
INTRAMUSCULAR | Status: DC | PRN
  Administered 2018-12-18: 1 mg via INTRAVENOUS

## 2018-12-18 MED ORDER — OXYCODONE HCL 5 MG PO TABS: 5.0000 mg | ORAL_TABLET | Freq: Once | ORAL | Status: DC | PRN

## 2018-12-18 MED ORDER — LIDOCAINE 2% (20 MG/ML) 5 ML SYRINGE
INTRAMUSCULAR | Status: DC | PRN
  Administered 2018-12-18: 40 mg via INTRAVENOUS

## 2018-12-18 MED ORDER — FENTANYL CITRATE (PF) 250 MCG/5ML IJ SOLN
INTRAMUSCULAR | Status: AC
  Filled 2018-12-18: qty 5

## 2018-12-18 MED ORDER — ROCURONIUM BROMIDE 50 MG/5ML IV SOSY
PREFILLED_SYRINGE | INTRAVENOUS | Status: DC | PRN
  Administered 2018-12-18: 10 mg via INTRAVENOUS
  Administered 2018-12-18: 20 mg via INTRAVENOUS
  Administered 2018-12-18: 50 mg via INTRAVENOUS
  Administered 2018-12-18 (×2): 10 mg via INTRAVENOUS

## 2018-12-18 MED ORDER — SODIUM CHLORIDE 0.9 % IV SOLN
INTRAVENOUS | Status: DC | PRN
  Administered 2018-12-18: 20 ug/min via INTRAVENOUS

## 2018-12-18 SURGICAL SUPPLY — 55 items
ADAPTER BRONCH F/PENTAX (ADAPTER) ×1 IMPLANT
ADAPTER BRONCHOSCOPE OLYMPUS (ADAPTER) ×3 IMPLANT
ADAPTER VALVE BIOPSY EBUS (MISCELLANEOUS) IMPLANT
ADPR BSCP EDG PNTX (ADAPTER) ×2
ADPR BSCP OLMPS EDG (ADAPTER) ×2
ADPTR VALVE BIOPSY EBUS (MISCELLANEOUS) ×6
BRUSH BIOPSY BRONCH 10 SDTNB (MISCELLANEOUS) ×1 IMPLANT
BRUSH CYTOL CELLEBRITY 1.5X140 (MISCELLANEOUS) IMPLANT
BRUSH SUPERTRAX BIOPSY (INSTRUMENTS) IMPLANT
BRUSH SUPERTRAX NDL-TIP CYTO (INSTRUMENTS) ×1 IMPLANT
CANISTER SUCT 3000ML PPV (MISCELLANEOUS) ×6 IMPLANT
CHANNEL WORK EXTEND EDGE 180 (KITS) IMPLANT
CHANNEL WORK EXTEND EDGE 90 (KITS) IMPLANT
CONT SPEC 4OZ CLIKSEAL STRL BL (MISCELLANEOUS) ×9 IMPLANT
COVER BACK TABLE 60X90IN (DRAPES) ×6 IMPLANT
COVER DOME SNAP 22 D (MISCELLANEOUS) ×3 IMPLANT
COVER WAND RF STERILE (DRAPES) ×3 IMPLANT
FILTER STRAW FLUID ASPIR (MISCELLANEOUS) IMPLANT
FORCEPS BIOP RJ4 1.8 (CUTTING FORCEPS) IMPLANT
FORCEPS BIOP SUPERTRX PREMAR (INSTRUMENTS) ×1 IMPLANT
GAUZE SPONGE 4X4 12PLY STRL (GAUZE/BANDAGES/DRESSINGS) ×6 IMPLANT
GLOVE BIO SURGEON STRL SZ 6.5 (GLOVE) ×6 IMPLANT
KIT CLEAN ENDO COMPLIANCE (KITS) ×12 IMPLANT
KIT ENDOBRONCHIAL EDGE FIRM (KITS) ×1 IMPLANT
KIT MARKER FIDUCIAL DELIVERY (KITS) ×1 IMPLANT
KIT PROCEDURE EDGE 180 (KITS) IMPLANT
KIT PROCEDURE EDGE 90 (KITS) IMPLANT
KIT TURNOVER KIT B (KITS) ×6 IMPLANT
MARKER FIDUCIAL SL NIT COIL (Implant Marker) ×3 IMPLANT
MARKER SKIN DUAL TIP RULER LAB (MISCELLANEOUS) ×6 IMPLANT
NDL ASPIRATION VIZISHOT 19G (NEEDLE) IMPLANT
NDL ASPIRATION VIZISHOT 21G (NEEDLE) ×2 IMPLANT
NDL FILTER BLUNT 18X1 1/2 (NEEDLE) IMPLANT
NDL SUPERTRX PREMARK BIOPSY (NEEDLE) IMPLANT
NEEDLE ASPIRATION VIZISHOT 19G (NEEDLE) IMPLANT
NEEDLE ASPIRATION VIZISHOT 21G (NEEDLE) ×3 IMPLANT
NEEDLE FILTER BLUNT 18X 1/2SAF (NEEDLE)
NEEDLE FILTER BLUNT 18X1 1/2 (NEEDLE) IMPLANT
NEEDLE SUPERTRX PREMARK BIOPSY (NEEDLE) ×3 IMPLANT
NS IRRIG 1000ML POUR BTL (IV SOLUTION) ×6 IMPLANT
OIL SILICONE PENTAX (PARTS (SERVICE/REPAIRS)) ×6 IMPLANT
PAD ARMBOARD 7.5X6 YLW CONV (MISCELLANEOUS) ×12 IMPLANT
PATCHES PATIENT (LABEL) ×9 IMPLANT
SYR 20CC LL (SYRINGE) ×3 IMPLANT
SYR 20ML ECCENTRIC (SYRINGE) ×6 IMPLANT
SYR 3ML LL SCALE MARK (SYRINGE) ×3 IMPLANT
TOWEL GREEN STERILE (TOWEL DISPOSABLE) ×6 IMPLANT
TOWEL GREEN STERILE FF (TOWEL DISPOSABLE) ×6 IMPLANT
TRAP SPECIMEN MUCOUS 40CC (MISCELLANEOUS) ×6 IMPLANT
TUBE CONNECTING 20X1/4 (TUBING) ×6 IMPLANT
UNDERPAD 30X30 (UNDERPADS AND DIAPERS) ×3 IMPLANT
VALVE BIOPSY  SINGLE USE (MISCELLANEOUS) ×2
VALVE BIOPSY SINGLE USE (MISCELLANEOUS) ×4 IMPLANT
VALVE SUCTION BRONCHIO DISP (MISCELLANEOUS) ×6 IMPLANT
WATER STERILE IRR 1000ML POUR (IV SOLUTION) ×6 IMPLANT

## 2018-12-18 NOTE — Anesthesia Procedure Notes (Signed)
Procedure Name: Intubation Date/Time: 12/18/2018 7:32 AM Performed by: Candis Shine, CRNA Pre-anesthesia Checklist: Patient identified, Emergency Drugs available, Suction available and Patient being monitored Patient Re-evaluated:Patient Re-evaluated prior to induction Oxygen Delivery Method: Circle System Utilized Preoxygenation: Pre-oxygenation with 100% oxygen Induction Type: IV induction Ventilation: Mask ventilation without difficulty and Oral airway inserted - appropriate to patient size Laryngoscope Size: Mac and 4 Grade View: Grade I Tube type: Oral Tube size: 8.5 mm Number of attempts: 1 Airway Equipment and Method: Stylet and Oral airway Placement Confirmation: ETT inserted through vocal cords under direct vision,  positive ETCO2 and breath sounds checked- equal and bilateral Secured at: 23 cm Tube secured with: Tape Dental Injury: Teeth and Oropharynx as per pre-operative assessment

## 2018-12-18 NOTE — Discharge Instructions (Signed)
Flexible Bronchoscopy, Care After This sheet gives you information about how to care for yourself after your procedure. Your health care provider may also give you more specific instructions. If you have problems or questions, contact your health care provider. What can I expect after the procedure? After the procedure, it is common to have the following symptoms for 24-48 hours:  A cough that is worse than it was before the procedure.  A low-grade fever.  A sore throat or hoarse voice.  Small streaks of blood in the mucus from your lungs (sputum), if tissue samples were removed (biopsy). Follow these instructions at home: Eating and drinking  Do not eat or drink anything (including water) for 2 hours after your procedure, or until your numbing medicine (local anesthetic) has worn off. Having a numb throat increases your risk of burning yourself or choking.  After your numbness is gone and your cough and gag reflexes have returned, you may start eating only soft foods and slowly drinking liquids.  The day after the procedure, return to your normal diet. Driving  Do not drive for 24 hours if you were given a medicine to help you relax (sedative).  Do not drive or use heavy machinery while taking prescription pain medicine. General instructions   Take over-the-counter and prescription medicines only as told by your health care provider.  Return to your normal activities as told by your health care provider. Ask your health care provider what activities are safe for you.  Do not use any products that contain nicotine or tobacco, such as cigarettes and e-cigarettes. If you need help quitting, ask your health care provider.  Keep all follow-up visits as told by your health care provider. This is important, especially if you had a biopsy taken. Get help right away if:  You have shortness of breath that gets worse.  You become light-headed or feel like you might faint.  You have  chest pain.  You cough up more than a small amount of blood.  The amount of blood you cough up increases. Summary  Common symptoms in the 24-48 hours following a flexible bronchoscopy include cough, low-grade fever, sore throat or hoarse voice, and blood-streaked mucus from the lungs (if you had a biopsy).  Do not eat or drink anything (including water) for 2 hours after your procedure, or until your local anesthetic has worn off. You can return to your normal diet the day after the procedure.  Get help right away if you develop worsening shortness of breath, have chest pain, become light-headed, or cough up more than a small amount of blood. This information is not intended to replace advice given to you by your health care provider. Make sure you discuss any questions you have with your health care provider. Document Released: 06/10/2005 Document Revised: 12/09/2016 Document Reviewed: 12/09/2016 Elsevier Interactive Patient Education  2019 [employer].

## 2018-12-18 NOTE — Transfer of Care (Signed)
Immediate Anesthesia Transfer of Care Note  Patient: Jack Taylor  Procedure(s) Performed: VIDEO BRONCHOSCOPY WITH ENDOBRONCHIAL ULTRASOUND (N/A Chest) VIDEO BRONCHOSCOPY WITH ENDOBRONCHIAL NAVIGATION with Placement of Fiducial Markers x 3 (N/A Chest) LUNG BIOPSY - of Left Upper Lobe Biopsy of #7 Node Biopsy of #10L Node  Patient Location: PACU  Anesthesia Type:General  Level of Consciousness: awake, alert  and oriented  Airway & Oxygen Therapy: Patient Spontanous Breathing and Patient connected to face mask oxygen  Post-op Assessment: Report given to RN and Post -op Vital signs reviewed and stable  Post vital signs: Reviewed and stable  Last Vitals:  Vitals Value Taken Time  BP 139/78 12/18/2018 10:12 AM  Temp    Pulse 78 12/18/2018 10:13 AM  Resp 12 12/18/2018 10:13 AM  SpO2 99 % 12/18/2018 10:13 AM  Vitals shown include unvalidated device data.  Last Pain:  Vitals:   12/18/18 0631  TempSrc:   PainSc: 0-No pain      Patients Stated Pain Goal: 2 (32/54/98 2641)  Complications: No apparent anesthesia complications

## 2018-12-18 NOTE — Brief Op Note (Signed)
      [address]       [place] [address]             [phone]      12/18/2018  10:00 AM  PATIENT:  Jack Taylor  73 y.o. male  PRE-OPERATIVE DIAGNOSIS:  PULMONARY NODULE  POST-OPERATIVE DIAGNOSIS:  PULMONARY NODULE  PROCEDURE:  Procedure(s): VIDEO BRONCHOSCOPY WITH ENDOBRONCHIAL ULTRASOUND (N/A) VIDEO BRONCHOSCOPY WITH ENDOBRONCHIAL NAVIGATION with Placement of Fiducial Markers x 3 (N/A) LUNG BIOPSY - of Left Upper Lobe Biopsy of #7 Node Biopsy of #10L Node  SURGEON:  Surgeon(s) and Role:    * Grace Isaac, MD - Primary    ANESTHESIA:   general  EBL:  None   BLOOD ADMINISTERED:none  DRAINS: none   LOCAL MEDICATIONS USED:  NONE  SPECIMEN:  Source of Specimen:  10 l, 7 nodes , bx of left upper lobe lesion  DISPOSITION OF SPECIMEN:  PATHOLOGY  COUNTS:  YES  DICTATION: .Dragon Dictation  PLAN OF CARE: Discharge to home after PACU  PATIENT DISPOSITION:  PACU - hemodynamically stable.   Delay start of Pharmacological VTE agent (>24hrs) due to surgical blood loss or risk of bleeding: yes

## 2018-12-18 NOTE — Op Note (Signed)
NAME: Jack Taylor, Jack Taylor MEDICAL RECORD HQ:[id number] ACCOUNT [id number] DATE OF BIRTH:Apr 16, 1946 FACILITY: [facility] LOCATION: [facility] PHYSICIAN:Dalayna Lauter Maryruth Bun, MD  OPERATIVE REPORT  DATE OF PROCEDURE:  12/18/2018  PREOPERATIVE DIAGNOSIS:  Left upper lobe lung nodule.  POSTOPERATIVE DIAGNOSIS:  Left upper lobe lung nodule, probable malignancy.  PROCEDURE PERFORMED:  Video bronchoscopy, endobronchial ultrasound with transbronchial biopsy of #7 and 10L lymph nodes, navigation bronchoscopy to left upper lobe lung nodule with brushings and washings, placement of fiducials x3.  SURGEON:  Lanelle Bal, MD  BRIEF HISTORY:  The patient is a 73 year old male with underlying pulmonary disease followed by the pulmonary service.  Long-term smoker who recently had a lung cancer screening CT that demonstrated an approximately 2 cm nodule in the left upper lobe.  A  PET scan was performed that showed the mass to be hypermetabolic.  In addition, there was mild metabolic uptake in the left hilum and left hilar area radiographically.  This was staged as a IIIA lung cancer.  The patient was referred to thoracic  surgery.  On initial consultation on the patient including reviewing the films, it was recommended to the patient that we obtain a tissue diagnosis.  Bronchoscopy with EBUS and navigation bronchoscopy to the left upper lobe lesion was recommended.  The  patient agreed and signed informed consent.  DESCRIPTION OF PROCEDURE:  The patient underwent general endotracheal anesthesia without incident.  Appropriate timeout was performed, and we proceeded with video bronchoscopy with 2.8 mm video bronchoscope to the subsegmental level in the right and left  tracheobronchial tree.  There were no definite endobronchial lesions noted.  The scope was removed and an EBUS scope placed.  The scope was passed into the left main stem bronchus and in the area of the takeoff of the left main stem bronchus, and area  of  lymph nodes consistent with #7 nodes appeared slightly enlarged.  Multiple transbronchial biopsies with an EBUS 21 needle were done and examined cytologically.  Lymph nodal tissue was obtained, but on preliminary smear, no definite malignancy was  noted.  The scope was then moved further out in the left main stem bronchus to the area of the carina between the lower lobe and upper lobe.  In this area, there were also areas of slightly enlarged lymph nodes.  Multiple biopsies, transbronchial, with a  21 EBUS needle were done in this area and also submitted, labeled 10L lymph nodes.  We then removed the EBUS scope and went back to the standard videoscope.  The Super D working channel was placed into the scope along with the LG probe.  Appropriate  registration from the preoperatively planned navigation was done.  We then without much difficulty navigated to the target which had previously been planned and got within 2 cm of it.  At this point, the lesion appeared very well lined up, and we  proceeded with needle aspirations and needle brush of this area.  One of the needle aspirates was suggestive of disease but was scant material.  We then used a local registration with fluoroscopic localization in the Super D system to further refine our  target.  We then again did additional needle aspirations and brushings of the area.  Further diagnostic material was obtained.  Triple brush was also used, as was a small biopsy forceps.  With the lesion being highly suggestive of malignancy and without  full decision about the most appropriate treatment pending staging, we decided to place fiducial markers in case the ultimate treatment  plan would be stereotactic radiotherapy.  Using the Super D system, 3 localization spots for fiducials were determined  and a fiducial marker placed under fluoroscopic guidance at each of the sites.  The scope was then removed.  The tracheobronchial tree was cleared of all secretions.   Fluoroscopy over the left lung showed no evidence of pneumothorax.  The scope was  removed.  The patient was awakened and extubated in the operating room without difficulty.  He remained stable and was transferred to the recovery room for further postoperative observation.  LN/NUANCE  D:12/18/2018 T:12/18/2018 JOB:[id number]

## 2018-12-18 NOTE — H&P (Signed)
[address]       [place] [address]             [phone]                    Anmol T Taborda [place] Medical Record #[id number] Date of Birth: [birth year]  Referring: No ref. provider found Primary Care: [name], MD Primary Cardiologist: No primary care provider on file.  Chief Complaint:    No chief complaint on file.   History of Present Illness:    Jack Taylor 73 y.o. male is seen in the office  Abnormal screening chest ct. Patient has long history of smoking and followed by pulmonary for COPD.      Current Activity/ Functional Status:  Patient is independent with mobility/ambulation, transfers, ADL's, IADL's.   Zubrod Score: At the time of surgery this patient's most appropriate activity status/level should be described as: []     0    Normal activity, no symptoms [x]     1    Restricted in physical strenuous activity but ambulatory, able to do out light work []     2    Ambulatory and capable of self care, unable to do work activities, up and about               >50 % of waking hours                              []     3    Only limited self care, in bed greater than 50% of waking hours []     4    Completely disabled, no self care, confined to bed or chair []     5    Moribund   Past Medical History:  Diagnosis Date  . Adenomatous colon polyp   . Anxiety   . Blood transfusion without reported diagnosis   . Cancer ([place])    Lung; 11/2018  . COPD (chronic obstructive pulmonary disease) ([place])   . Gastric ulcer   . GERD (gastroesophageal reflux disease)   . High cholesterol   . Hypertension     Past Surgical History:  Procedure Laterality Date  . COLONOSCOPY  2008  . ESOPHAGOGASTRODUODENOSCOPY  11/23/2011   Procedure: ESOPHAGOGASTRODUODENOSCOPY (EGD);  Surgeon: Owens Loffler, MD;  Location: [facility];  Service: Endoscopy;  Laterality: N/A;  . UPPER GASTROINTESTINAL ENDOSCOPY  11/23/11    Family History  Problem Relation  Age of Onset  . Stomach cancer Mother   . Heart disease Father   . Colon cancer Neg Hx      Social History   Tobacco Use  Smoking Status Former Smoker  . Packs/day: 0.25  . Years: 5.00  . Pack years: 1.25  . Types: Cigarettes  . Last attempt to quit: 11/04/2018  . Years since quitting: 0.1  Smokeless Tobacco Never Used  Tobacco Comment   recently quit when diagnosed with lung cancer.     Social History   Substance and Sexual Activity  Alcohol Use No     No Known Allergies  No current facility-administered medications for this encounter.     Pertinent items are noted in HPI.   Review of Systems:     Cardiac Review of Systems: [Y] = yes  or   [ N ] = no   Chest Pain n]  Resting SOB [ n  ] Exertional  SOB  [ y ]  Orthopnea [ n ]   Pedal Edema [ n  ]    Palpitations [n  ] Syncope  [ n ]   Presyncope [ n  ]   General Review of Systems: [Y] = yes [  ]=no Constitional: recent weight change [n  ];  Wt loss over the last 3 months [   ] anorexia [  ]; fatigue [  ]; nausea [  ]; night sweats [  ]; fever [  ]; or chills [  ];           Eye : blurred vision [  ]; diplopia [   ]; vision changes [  ];  Amaurosis fugax[  ]; Resp: cough [  ];  wheezing[  ];  hemoptysis[  ]; shortness of breath[  ]; paroxysmal nocturnal dyspnea[  ]; dyspnea on exertion[  ]; or orthopnea[  ];  GI:  gallstones[  ], vomiting[  ];  dysphagia[  ]; melena[  ];  hematochezia [  ]; heartburn[  ];   Hx of  Colonoscopy[  ]; GU: kidney stones [  ]; hematuria[  ];   dysuria [  ];  nocturia[  ];  history of     obstruction [  ]; urinary frequency [  ]             Skin: rash, swelling[  ];, hair loss[  ];  peripheral edema[  ];  or itching[  ]; Musculosketetal: myalgias[  ];  joint swelling[  ];  joint erythema[  ];  joint pain[  ];  back pain[  ];  Heme/Lymph: bruising[  ];  bleeding[  ];  anemia[  ];  Neuro: TIA[  ];  headaches[  ];  stroke[  ];  vertigo[  ];  seizures[  ];   paresthesias[  ];  difficulty walking[   ];  Psych:depression[  ]; anxiety[  ];  Endocrine: diabetes[  ];  thyroid dysfunction[  ];  Immunizations: Flu up to date [ y ]; Pneumococcal up to date [facility]ese  ];  Other:     PHYSICAL EXAMINATION: BP (!) 144/72   Pulse (!) 50   Temp 97.7 F (36.5 C) (Oral)   Resp 18   SpO2 95%  General appearance: alert, cooperative, appears older than stated age and distracted Head: Normocephalic, without obvious abnormality, atraumatic Neck: no adenopathy, no carotid bruit, no JVD, supple, symmetrical, trachea midline and thyroid not enlarged, symmetric, no tenderness/mass/nodules Lymph nodes: Cervical, supraclavicular, and axillary nodes normal. Resp: diminished breath sounds bibasilar Back: symmetric, no curvature. ROM normal. No CVA tenderness. Cardio: regular rate and rhythm, S1, S2 normal, no murmur, click, rub or gallop GI: soft, non-tender; bowel sounds normal; no masses,  no organomegaly Extremities: extremities normal, atraumatic, no cyanosis or edema and Homans sign is negative, no sign of DVT Neurologic: Grossly normal But slow to answer questions, turns to his wife for her to answer   Diagnostic Studies & Laboratory data:     Recent Radiology Findings:   Nm Pet Image Initial (pi) Skull Base To Thigh  Result Date: 12/11/2018 CLINICAL DATA:  Initial treatment strategy for pulmonary nodule. EXAM: NUCLEAR MEDICINE PET SKULL BASE TO THIGH TECHNIQUE: 8.8 mCi F-18 FDG was injected intravenously. Full-ring PET imaging was performed from the skull base to thigh after the radiotracer. CT data was obtained and used for attenuation correction and anatomic localization. Fasting blood glucose: 103 mg/dl COMPARISON:  Chest CT 12 17 19  FINDINGS:  Mediastinal blood pool activity: SUV max 2.2 NECK: No hypermetabolic lymph nodes in the neck. Incidental CT findings: none CHEST: Spiculated nodule in the LEFT upper lobe measuring 15 mm is unchanged in size from 15 mm in short interval (CT 11/20/2018). This nodule  is intensely hypermetabolic with SUV max equal 7.6. No additional hypermetabolic pulmonary nodules. There is a hypermetabolic lymph node at the LEFT hilum with SUV max equal 8.1. This lymph node is not well-defined on the noncontrast CT. Small LEFT lower paratracheal lymph node measuring only several mm (image 74/5) has significant radiotracer activity for size with SUV max equal 4.2. No contralateral hypermetabolic lymph nodes. Incidental CT findings: Coronary artery calcification and aortic atherosclerotic calcification. ABDOMEN/PELVIS: No abnormal hypermetabolic activity within the liver, pancreas, adrenal glands, or spleen. No hypermetabolic lymph nodes in the abdomen or pelvis. Incidental CT findings: Enlarged prostate gland. Atherosclerotic calcification of the aorta. SKELETON: No focal hypermetabolic activity to suggest skeletal metastasis. Incidental CT findings: none IMPRESSION: 1. Hypermetabolic LEFT upper lobe pulmonary nodule. Hypermetabolic LEFT hilar lymph node and potential ipsilateral mediastinal lymph node. 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 ( T1b N2 m0). 2. If multidisciplinary follow up management is desired, this is available in the [place] through the [facility] [phone]. These results will be called to the ordering clinician or representative by the Radiologist Assistant, and communication documented in the PACS or zVision Dashboard. Electronically Signed   By: Suzy Bouchard M.D.   On: 12/11/2018 14:39   Ct Chest Lung Ca Screen Low Dose W/o Cm  Result Date: 11/20/2018 CLINICAL DATA:  73 year old asymptomatic male current smoker with 49 pack-year smoking history. EXAM: CT CHEST WITHOUT CONTRAST LOW-DOSE FOR LUNG CANCER SCREENING TECHNIQUE: Multidetector CT imaging of the chest was performed following the standard protocol without IV contrast. COMPARISON:  11/22/2011 chest CT angiogram. 08/27/2015 chest radiograph. FINDINGS:  Cardiovascular: Normal heart size. No significant pericardial effusion/thickening. Three-vessel coronary atherosclerosis. Atherosclerotic nonaneurysmal thoracic aorta. Normal caliber pulmonary arteries. Mediastinum/Nodes: No discrete thyroid nodules. Unremarkable esophagus. No pathologically enlarged axillary, mediastinal or hilar lymph nodes, noting limited sensitivity for the detection of hilar adenopathy on this noncontrast study. Lungs/Pleura: No pneumothorax. No pleural effusion. Mild centrilobular emphysema with diffuse bronchial wall thickening. No acute consolidative airspace disease or lung masses. Several solid pulmonary nodules throughout both lungs. The dominant pulmonary nodule is a spiculated solid left upper lobe pulmonary nodule measuring 15.1 mm in volume derived mean diameter (series 9/image 171). Upper abdomen: No acute abnormality. Musculoskeletal: No aggressive appearing focal osseous lesions. Moderate thoracic spondylosis. IMPRESSION: 1. Lung-RADS 4B, suspicious. Dominant spiculated solid 15.1 mm left upper lobe pulmonary nodule, highly suspicious for primary bronchogenic carcinoma. Additional imaging evaluation or consultation with Pulmonology or Thoracic Surgery recommended. 2. No thoracic adenopathy evident on this noncontrast low-dose CT. 3. Three-vessel coronary atherosclerosis. Aortic Atherosclerosis (ICD10-I70.0) and Emphysema (ICD10-J43.9). These results will be called to the ordering clinician or representative by the Radiologist Assistant, and communication documented in the PACS or zVision Dashboard. Electronically Signed   By: Ilona Sorrel M.D.   On: 11/20/2018 14:47     I have independently reviewed the above radiology studies  and reviewed the findings with the patient.   Recent Lab Findings: Lab Results  Component Value Date   WBC 6.0 12/17/2018   HGB 13.9 12/17/2018   HCT 41.1 12/17/2018   PLT 142 (L) 12/17/2018   GLUCOSE 127 (H) 12/17/2018   ALT 26 12/17/2018    AST 19 12/17/2018   NA  140 12/17/2018   K 4.0 12/17/2018   CL 106 12/17/2018   CREATININE 1.55 (H) 12/17/2018   BUN 17 12/17/2018   CO2 26 12/17/2018   INR 1.03 12/17/2018   PFT's  2017 FEV1  1.3 40% DLCO 19.71  62%  Chronic Kidney Disease   Stage I     GFR >90  Stage II    GFR 60-89  Stage IIIA GFR 45-59  Stage IIIB GFR 30-44  Stage IV   GFR 15-29  Stage V    GFR  <15  Lab Results  Component Value Date   CREATININE 1.55 (H) 12/17/2018   Estimated Creatinine Clearance: 41.7 mL/min (A) (by C-G formula based on SCr of 1.55 mg/dL (H)).  Assessment / Plan:   1/Left upper lobe lung mass - radiographic evidence of clinical stage III lung cancer c (T1b,N2,M0)- I have reviewed the clinical diagnosis with the patient and his wife. I have recommended proceeding with bronch, EBUS and navigation bronchoscopy to stage and get a tissue dx. Risk of biopsy have been explained. Patient agreeable to move ahead quickly  2/ Followed by pulmonary for underlying COPD  3/ chronic kidney disease Stage iiiB     The goals risks and alternatives of the planned surgical procedure Procedure(s): VIDEO BRONCHOSCOPY WITH ENDOBRONCHIAL ULTRASOUND (N/A) VIDEO BRONCHOSCOPY WITH ENDOBRONCHIAL NAVIGATION (N/A)  have been discussed with the patient in detail. The risks of the procedure including death, infection, stroke, myocardial infarction, bleeding, blood transfusion, pneumothorax have all been discussed specifically.  I have quoted Judson Roch a 1 % of perioperative mortality and a complication rate as high as 10 %. The patient's questions have been answered.Judson Roch is willing  to proceed with the planned procedure.    Grace Isaac MD      [address] [place] [address] Office [phone]   Beeper [phone]  12/18/2018 7:08 AM

## 2018-12-19 ENCOUNTER — Telehealth: Payer: Self-pay | Admitting: *Deleted

## 2018-12-19 ENCOUNTER — Encounter (HOSPITAL_COMMUNITY): Payer: Self-pay | Admitting: Cardiothoracic Surgery

## 2018-12-19 DIAGNOSIS — R918 Other nonspecific abnormal finding of lung field: Secondary | ICD-10-CM

## 2018-12-19 NOTE — Telephone Encounter (Signed)
Oncology Nurse Navigator Documentation  Oncology Nurse Navigator Flowsheets 12/19/2018  Navigator Location [facility]  Navigator Encounter Type Telephone/I received a vm message from patient's wife to call her back with patient's appt.  I called and spoke with her and updated on appt.    Telephone Incoming Call;Outgoing Call  Treatment Phase Pre-Tx/Tx Discussion  Barriers/Navigation Needs Education;Coordination of Care  Education Other  Interventions Coordination of Care;Education  Coordination of Care Appts  Education Method Verbal  Acuity Level 1  Time Spent with Patient 15

## 2018-12-19 NOTE — Telephone Encounter (Signed)
Oncology Nurse Navigator Documentation  Oncology Nurse Navigator Flowsheets 12/19/2018  Navigator Location [facility]  Referral date to RadOnc/MedOnc 12/18/2018  Navigator Encounter Type Telephone/I received referral from Dr. Servando Snare yesterday.  I called to schedule him to be seen next week. I was unable to reach but did leave a vm message for him to call me.   Telephone Outgoing Call  Confirmed Diagnosis Date 12/18/2018  Treatment Phase Pre-Tx/Tx Discussion  Barriers/Navigation Needs Coordination of Care  Interventions Coordination of Care  Coordination of Care Other  Acuity Level 1  Time Spent with Patient 15

## 2018-12-20 ENCOUNTER — Telehealth: Payer: Self-pay | Admitting: Cardiothoracic Surgery

## 2018-12-20 ENCOUNTER — Other Ambulatory Visit: Payer: Self-pay | Admitting: *Deleted

## 2018-12-20 DIAGNOSIS — R911 Solitary pulmonary nodule: Secondary | ICD-10-CM

## 2018-12-20 NOTE — Telephone Encounter (Signed)
No ansewer

## 2018-12-21 NOTE — Anesthesia Postprocedure Evaluation (Signed)
Anesthesia Post Note  Patient: Jack Taylor  Procedure(s) Performed: VIDEO BRONCHOSCOPY WITH ENDOBRONCHIAL ULTRASOUND (N/A Chest) VIDEO BRONCHOSCOPY WITH ENDOBRONCHIAL NAVIGATION with Placement of Fiducial Markers x 3 (N/A Chest) LUNG BIOPSY - of Left Upper Lobe Biopsy of #7 Node Biopsy of #10L Node     Patient location during evaluation: PACU Anesthesia Type: General Level of consciousness: awake and alert Pain management: pain level controlled Vital Signs Assessment: post-procedure vital signs reviewed and stable Respiratory status: spontaneous breathing, nonlabored ventilation, respiratory function stable and patient connected to nasal cannula oxygen Cardiovascular status: blood pressure returned to baseline and stable Postop Assessment: no apparent nausea or vomiting Anesthetic complications: no    Last Vitals:  Vitals:   12/18/18 1030 12/18/18 1045  BP: 119/72 131/71  Pulse: 61 66  Resp: 14 18  Temp:  36.4 C  SpO2: 94% 92%    Last Pain:  Vitals:   12/18/18 1030  TempSrc:   PainSc: 0-No pain                 Sandford Diop S

## 2018-12-26 ENCOUNTER — Encounter (HOSPITAL_COMMUNITY): Payer: PPO

## 2018-12-27 ENCOUNTER — Inpatient Hospital Stay: Payer: PPO | Attending: Internal Medicine

## 2018-12-27 ENCOUNTER — Institutional Professional Consult (permissible substitution) (INDEPENDENT_AMBULATORY_CARE_PROVIDER_SITE_OTHER): Payer: PPO | Admitting: Cardiothoracic Surgery

## 2018-12-27 ENCOUNTER — Ambulatory Visit
Admission: RE | Admit: 2018-12-27 | Discharge: 2018-12-27 | Disposition: A | Discharge status: Home / Self Care | Payer: PPO | Source: Ambulatory Visit | Attending: Radiation Oncology | Admitting: Radiation Oncology

## 2018-12-27 ENCOUNTER — Encounter: Payer: Self-pay | Admitting: Internal Medicine

## 2018-12-27 ENCOUNTER — Inpatient Hospital Stay (HOSPITAL_BASED_OUTPATIENT_CLINIC_OR_DEPARTMENT_OTHER): Payer: PPO | Admitting: Internal Medicine

## 2018-12-27 ENCOUNTER — Other Ambulatory Visit: Payer: Self-pay | Admitting: *Deleted

## 2018-12-27 VITALS — BP 120/59 | HR 55 | Temp 98.3°F | Resp 18 | Wt 170.2 lb

## 2018-12-27 VITALS — BP 120/59 | HR 55 | Temp 98.0°F | Resp 18 | Ht 68.0 in | Wt 170.4 lb

## 2018-12-27 DIAGNOSIS — F17200 Nicotine dependence, unspecified, uncomplicated: Secondary | ICD-10-CM

## 2018-12-27 DIAGNOSIS — Z87891 Personal history of nicotine dependence: Secondary | ICD-10-CM | POA: Diagnosis not present

## 2018-12-27 DIAGNOSIS — C3492 Malignant neoplasm of unspecified part of left bronchus or lung: Secondary | ICD-10-CM

## 2018-12-27 DIAGNOSIS — Z79899 Other long term (current) drug therapy: Secondary | ICD-10-CM | POA: Diagnosis not present

## 2018-12-27 DIAGNOSIS — C3412 Malignant neoplasm of upper lobe, left bronchus or lung: Secondary | ICD-10-CM

## 2018-12-27 DIAGNOSIS — R918 Other nonspecific abnormal finding of lung field: Secondary | ICD-10-CM

## 2018-12-27 DIAGNOSIS — I10 Essential (primary) hypertension: Secondary | ICD-10-CM | POA: Insufficient documentation

## 2018-12-27 DIAGNOSIS — J449 Chronic obstructive pulmonary disease, unspecified: Secondary | ICD-10-CM

## 2018-12-27 DIAGNOSIS — R0609 Other forms of dyspnea: Secondary | ICD-10-CM | POA: Diagnosis not present

## 2018-12-27 DIAGNOSIS — K219 Gastro-esophageal reflux disease without esophagitis: Secondary | ICD-10-CM

## 2018-12-27 DIAGNOSIS — Z8249 Family history of ischemic heart disease and other diseases of the circulatory system: Secondary | ICD-10-CM | POA: Insufficient documentation

## 2018-12-27 DIAGNOSIS — R5383 Other fatigue: Secondary | ICD-10-CM | POA: Insufficient documentation

## 2018-12-27 DIAGNOSIS — F17210 Nicotine dependence, cigarettes, uncomplicated: Secondary | ICD-10-CM | POA: Diagnosis not present

## 2018-12-27 DIAGNOSIS — Z7189 Other specified counseling: Secondary | ICD-10-CM | POA: Insufficient documentation

## 2018-12-27 DIAGNOSIS — C3490 Malignant neoplasm of unspecified part of unspecified bronchus or lung: Secondary | ICD-10-CM

## 2018-12-27 LAB — CMP (CANCER CENTER ONLY)
ALT: 26 U/L (ref 0–44)
AST: 14 U/L — ABNORMAL LOW (ref 15–41)
Albumin: 3.6 g/dL (ref 3.5–5.0)
Alkaline Phosphatase: 64 U/L (ref 38–126)
Anion gap: 8 (ref 5–15)
BUN: 14 mg/dL (ref 8–23)
CO2: 25 mmol/L (ref 22–32)
Calcium: 8.6 mg/dL — ABNORMAL LOW (ref 8.9–10.3)
Chloride: 107 mmol/L (ref 98–111)
Creatinine: 1.49 mg/dL — ABNORMAL HIGH (ref 0.61–1.24)
GFR, Est AFR Am: 54 mL/min — ABNORMAL LOW (ref >60)
GFR, Estimated: 46 mL/min — ABNORMAL LOW (ref >60)
Glucose, Bld: 211 mg/dL — ABNORMAL HIGH (ref 70–99)
Potassium: 3.9 mmol/L (ref 3.5–5.1)
Sodium: 140 mmol/L (ref 135–145)
Total Bilirubin: 0.4 mg/dL (ref 0.3–1.2)
Total Protein: 6.4 g/dL — ABNORMAL LOW (ref 6.5–8.1)

## 2018-12-27 LAB — CBC WITH DIFFERENTIAL (CANCER CENTER ONLY)
Abs Immature Granulocytes: 0.03 10*3/uL (ref 0.00–0.07)
BASOS PCT: 1 %
Basophils Absolute: 0 10*3/uL (ref 0.0–0.1)
Eosinophils Absolute: 0.1 10*3/uL (ref 0.0–0.5)
Eosinophils Relative: 2 %
HCT: 38.6 % — ABNORMAL LOW (ref 39.0–52.0)
Hemoglobin: 13.1 g/dL (ref 13.0–17.0)
Immature Granulocytes: 0 %
Lymphocytes Relative: 35 %
Lymphs Abs: 2.4 10*3/uL (ref 0.7–4.0)
MCH: 31.9 pg (ref 26.0–34.0)
MCHC: 33.9 g/dL (ref 30.0–36.0)
MCV: 93.9 fL (ref 80.0–100.0)
Monocytes Absolute: 0.5 10*3/uL (ref 0.1–1.0)
Monocytes Relative: 7 %
NRBC: 0 % (ref 0.0–0.2)
Neutro Abs: 3.8 10*3/uL (ref 1.7–7.7)
Neutrophils Relative %: 55 %
Platelet Count: 142 10*3/uL — ABNORMAL LOW (ref 150–400)
RBC: 4.11 MIL/uL — ABNORMAL LOW (ref 4.22–5.81)
RDW: 12.5 % (ref 11.5–15.5)
WBC Count: 6.9 10*3/uL (ref 4.0–10.5)

## 2018-12-27 NOTE — Progress Notes (Signed)
[place] [phone]   [phone] Multidisciplinary thoracic oncology clinic CONSULT NOTE  REFERRING PHYSICIAN: Dr. Lanelle Bal  REASON FOR CONSULTATION:  73 years old white male recently diagnosed with lung cancer.  HPI Jack Taylor is a 73 y.o. male with past medical history significant for COPD, hypertension, dyslipidemia, GERD, colon polyps as well as gastric ulcer and long history of smoking.  The patient was seen by his primary care physician for annual physical exam and during his examination he had a lot of wheezing.  He had CT screening of the chest performed on November 20, 2018 and it showed 1.51 cm left upper lobe pulmonary nodule.  This was followed by a PET scan on December 11, 2018 and that showed spiculated nodule in the left upper lobe measuring 1.5 cm intensely hypermetabolic with SUV max of 7.6.  No additional hypermetabolic pulmonary nodules.  There was a hypermetabolic lymph node at the left hilum with SUV max of 8.1 in addition to a small left lower paratracheal lymph node measuring only several millimeter and has significant radio tracer activity for the size with SUV max equal 4.2.  The patient was seen by Dr. Servando Snare and he underwent bronchoscopy with endobronchial ultrasound and navigational bronchoscopy with biopsy of the left upper lobe lung nodule on December 18, 2018.  The final pathology ([id number]) was consistent with non-small cell carcinoma the immunohistochemistry were positive for TTF-1 and negative for p63 and CK 5/6.  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 The patient was referred to the multidisciplinary thoracic oncology clinic today for evaluation and recommendation regarding treatment of his condition. When seen today the patient is feeling fine except for shortness of breath with exertion but he denied having any significant chest pain, cough or hemoptysis.  He denied having any weight  loss or night sweats.  He has no nausea, vomiting, diarrhea or constipation.  He denied having any headache or visual changes. Family history significant for mother with stomach cancer and father had heart disease. The patient is married and has 1 son and 2 stepsons.  He is currently retired and used to work in [employer]. The patient was accompanied today by his wife Shirlean Mylar.  He has a history for smoking 1 pack/day for around 7 years and quit recently.  He denied having any history of alcohol or drug abuse.  HPI  Past Medical History:  Diagnosis Date  . Adenomatous colon polyp   . Anxiety   . Blood transfusion without reported diagnosis   . Cancer ([place])    Lung; 11/2018  . COPD (chronic obstructive pulmonary disease) ([place])   . Gastric ulcer   . GERD (gastroesophageal reflux disease)   . High cholesterol   . Hypertension     Past Surgical History:  Procedure Laterality Date  . COLONOSCOPY  2008  . ESOPHAGOGASTRODUODENOSCOPY  11/23/2011   Procedure: ESOPHAGOGASTRODUODENOSCOPY (EGD);  Surgeon: Owens Loffler, MD;  Location: [facility];  Service: Endoscopy;  Laterality: N/A;  . LUNG BIOPSY  12/18/2018   Procedure: LUNG BIOPSY - of Left Upper Lobe Biopsy of #7 Node Biopsy of #10L Node;  Surgeon: Grace Isaac, MD;  Location: [facility];  Service: Thoracic;;  . UPPER GASTROINTESTINAL ENDOSCOPY  11/23/11  . VIDEO BRONCHOSCOPY WITH ENDOBRONCHIAL NAVIGATION N/A 12/18/2018   Procedure: VIDEO BRONCHOSCOPY WITH ENDOBRONCHIAL NAVIGATION with Placement of Fiducial Markers x 3;  Surgeon: Grace Isaac, MD;  Location: [facility];  Service: Thoracic;  Laterality: N/A;  . VIDEO BRONCHOSCOPY WITH ENDOBRONCHIAL ULTRASOUND N/A 12/18/2018   Procedure: VIDEO BRONCHOSCOPY WITH ENDOBRONCHIAL ULTRASOUND;  Surgeon: Grace Isaac, MD;  Location: [facility] OR;  Service: Thoracic;  Laterality: N/A;    Family History  Problem Relation Age of Onset  . Stomach cancer Mother   . Heart disease Father   .  Colon cancer Neg Hx     Social History Social History   Tobacco Use  . Smoking status: Former Smoker    Packs/day: 0.25    Years: 5.00    Pack years: 1.25    Types: Cigarettes    Last attempt to quit: 11/04/2018    Years since quitting: 0.1  . Smokeless tobacco: Never Used  . Tobacco comment: recently quit when diagnosed with lung cancer.   Substance Use Topics  . Alcohol use: No  . Drug use: No    No Known Allergies  Current Outpatient Medications  Medication Sig Dispense Refill  . albuterol (PROVENTIL HFA;VENTOLIN HFA) 108 (90 Base) MCG/ACT inhaler Inhale 2 puffs into the lungs every 6 (six) hours as needed for wheezing or shortness of breath. Reported on 06/02/2016    . amLODipine (NORVASC) 10 MG tablet Take 10 mg by mouth daily.     Marland Kitchen esomeprazole (NEXIUM) 40 MG capsule Take 40 mg by mouth daily.     Marland Kitchen losartan (COZAAR) 50 MG tablet Take 50 mg by mouth daily.     . rosuvastatin (CRESTOR) 10 MG tablet Take 10 mg by mouth daily.    . Tiotropium Bromide-Olodaterol 2.5-2.5 MCG/ACT AERS Inhale 1 puff into the lungs daily.     Current Facility-Administered Medications  Medication Dose Route Frequency Provider Last Rate Last Dose  . 0.9 %  sodium chloride infusion  500 mL Intravenous Continuous Milus Banister, MD        Review of Systems  Constitutional: positive for fatigue Eyes: negative Ears, nose, mouth, throat, and face: negative Respiratory: positive for dyspnea on exertion Cardiovascular: negative Gastrointestinal: negative Genitourinary:negative Integument/breast: negative Hematologic/lymphatic: negative Musculoskeletal:negative Neurological: negative Behavioral/Psych: negative Endocrine: negative Allergic/Immunologic: negative  Physical Exam  DZH:GDJME, healthy, no distress, well nourished, well developed and anxious SKIN: skin color, texture, turgor are normal, no rashes or significant lesions HEAD: Normocephalic, No masses, lesions, tenderness or  abnormalities EYES: normal, PERRLA, Conjunctiva are pink and non-injected EARS: External ears normal, Canals clear OROPHARYNX:no exudate, no erythema and lips, buccal mucosa, and tongue normal  NECK: supple, no adenopathy, no JVD LYMPH:  no palpable lymphadenopathy, no hepatosplenomegaly LUNGS: clear to auscultation , and palpation HEART: regular rate & rhythm, no murmurs and no gallops ABDOMEN:abdomen soft, non-tender, normal bowel sounds and no masses or organomegaly BACK: No CVA tenderness, Range of motion is normal EXTREMITIES:no joint deformities, effusion, or inflammation, no edema  NEURO: alert & oriented x 3 with fluent speech, no focal motor/sensory deficits  PERFORMANCE STATUS: ECOG 1  LABORATORY DATA: Lab Results  Component Value Date   WBC 6.9 12/27/2018   HGB 13.1 12/27/2018   HCT 38.6 (L) 12/27/2018   MCV 93.9 12/27/2018   PLT 142 (L) 12/27/2018      Chemistry      Component Value Date/Time   NA 140 12/27/2018 1351   K 3.9 12/27/2018 1351   CL 107 12/27/2018 1351   CO2 25 12/27/2018 1351   BUN 14 12/27/2018 1351   CREATININE 1.49 (H) 12/27/2018 1351      Component Value Date/Time   CALCIUM 8.6 (L) 12/27/2018 1351  ALKPHOS 64 12/27/2018 1351   AST 14 (L) 12/27/2018 1351   ALT 26 12/27/2018 1351   BILITOT 0.4 12/27/2018 1351       RADIOGRAPHIC STUDIES: Dg Chest 2 View  Result Date: 12/18/2018 CLINICAL DATA:  Preop for lung biopsy EXAM: CHEST - 2 VIEW COMPARISON:  PET-CT of 12/11/2018, and chest x-ray of 08/27/2015 FINDINGS: The anterior inferior left upper lobe lung nodule noted by CT is vaguely seen on chest x-ray. Otherwise the lungs appear clear, and somewhat hyperaerated. No infiltrate or effusion is seen. Mediastinal and hilar contours are unremarkable. The heart is within normal limits in size. There are mild degenerative changes in the mid to lower thoracic spine. IMPRESSION: The left upper lobe pulmonary nodule is not as well seen by chest x-ray  but is vaguely present on the frontal view. Slight hyper aeration remains. Electronically Signed   By: Ivar Drape M.D.   On: 12/18/2018 08:04   Nm Pet Image Initial (pi) Skull Base To Thigh  Result Date: 12/11/2018 CLINICAL DATA:  Initial treatment strategy for pulmonary nodule. EXAM: NUCLEAR MEDICINE PET SKULL BASE TO THIGH TECHNIQUE: 8.8 mCi F-18 FDG was injected intravenously. Full-ring PET imaging was performed from the skull base to thigh after the radiotracer. CT data was obtained and used for attenuation correction and anatomic localization. Fasting blood glucose: 103 mg/dl COMPARISON:  Chest CT 12 17 19  FINDINGS: Mediastinal blood pool activity: SUV max 2.2 NECK: No hypermetabolic lymph nodes in the neck. Incidental CT findings: none CHEST: Spiculated nodule in the LEFT upper lobe measuring 15 mm is unchanged in size from 15 mm in short interval (CT 11/20/2018). This nodule is intensely hypermetabolic with SUV max equal 7.6. No additional hypermetabolic pulmonary nodules. There is a hypermetabolic lymph node at the LEFT hilum with SUV max equal 8.1. This lymph node is not well-defined on the noncontrast CT. Small LEFT lower paratracheal lymph node measuring only several mm (image 74/5) has significant radiotracer activity for size with SUV max equal 4.2. No contralateral hypermetabolic lymph nodes. Incidental CT findings: Coronary artery calcification and aortic atherosclerotic calcification. ABDOMEN/PELVIS: No abnormal hypermetabolic activity within the liver, pancreas, adrenal glands, or spleen. No hypermetabolic lymph nodes in the abdomen or pelvis. Incidental CT findings: Enlarged prostate gland. Atherosclerotic calcification of the aorta. SKELETON: No focal hypermetabolic activity to suggest skeletal metastasis. Incidental CT findings: none IMPRESSION: 1. Hypermetabolic LEFT upper lobe pulmonary nodule. Hypermetabolic LEFT hilar lymph node and potential ipsilateral mediastinal lymph node. Findings  concerning for stage III bronchogenic carcinoma. ( T1b N2 m0). 2. If multidisciplinary follow up management is desired, this is available in the [facility] through the Multidisciplinary Thoracic Clinic [phone]. These results will be called to the ordering clinician or representative by the Radiologist Assistant, and communication documented in the PACS or zVision Dashboard. Electronically Signed   By: Suzy Bouchard M.D.   On: 12/11/2018 14:39   Dg C-arm Bronchoscopy  Result Date: 12/18/2018 C-ARM BRONCHOSCOPY: Fluoroscopy was utilized by the requesting physician.  No radiographic interpretation.    ASSESSMENT: This is a very pleasant 73 years old white male recently diagnosed with stage IIA/IIIA (T1b, N1/N2, M0) non-small cell lung cancer consistent with adenocarcinoma presented with left upper lobe lung nodule in addition to left hilar and suspicious mediastinal lymphadenopathy diagnosed in January 2020   PLAN: I had a lengthy discussion with the patient and his wife today about his current disease stage, prognosis and treatment options. I personally and independently reviewed his scan images  and discussed the result and showed the images to the patient and his wife. I recommended for the patient to complete the staging work-up by ordering MRI of the brain to rule out brain metastasis. I recommended for the patient to see Dr. Servando Snare first for evaluation of surgical resection if he is candidate for left upper lobectomy with lymph node dissection. If the patient is not a good surgical candidate, we will consider him for a course of concurrent chemoradiation with weekly carboplatin for AUC of 2 and paclitaxel 45 mg/M2 I discussed with the patient the adverse effect of this treatment including but not limited to alopecia, myelosuppression, nausea and vomiting, peripheral neuropathy, liver or renal dysfunction. The patient would be expected to start the first cycle of this concurrent  chemoradiation on January 07, 2018 if he is not a good surgical candidate for resection, otherwise we will cancel his concurrent chemo radiation plan and see the patient after the surgery for consideration of adjuvant chemotherapy if needed. The patient was seen during the multidisciplinary thoracic oncology clinic today by medical oncology, radiation oncology, thoracic surgery, and social worker. Based on the evaluation of Dr. Servando Snare today, he would consider the patient for surgical resection and I will arrange for him a follow-up appointment after his surgical resection. He was advised to call immediately if he has any concerning symptoms. The patient voices understanding of current disease status and treatment options and is in agreement with the current care plan. All questions were answered. The patient knows to call the clinic with any problems, questions or concerns. We can certainly see the patient much sooner if necessary.  Thank you so much for allowing me to participate in the care of Jack Taylor. I will continue to follow up the patient with you and assist in his care.  I spent 55 minutes counseling the patient face to face. The total time spent in the appointment was 80 minutes.  Disclaimer: This note was dictated with voice recognition software. Similar sounding words can inadvertently be transcribed and may not be corrected upon review.   Eilleen Kempf December 27, 2018, 3:08 PM

## 2018-12-27 NOTE — Progress Notes (Signed)
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 Adenocarcinoma of left lung, stage 3 (HCC) C34.92     HISTORY OF PRESENT ILLNESS::Jack Taylor is a 73 y.o. male current smoker with 49 pack-year smoking history. He presented to primary care with wheezing and was recommended to undergo low-dose lung cancer screening chest CT. This scan was performed on 11/20/18 and identified a dominant spiculated solid 1.5 cm left upper lobe pulmonary nodule, Lung-RADS 4B. No thoracic adenopathy evident on this noncontrast low-dose CT.  PET scan on 12/11/18 showed the left upper lobe pulmonary nodule unchanged in size with max SUV of 7.6. There was also a hypermetabolic left hilar lymph node and a potential ipsilateral mediastinal lymph node.   He underwent bronchoscopy with Dr. Servando Snare on 12/18/18. Biopsy of the left upper lung revealed non-small cell carcinoma, adenocarcinoma. Biopsied lymph nodes were negative.  The patient was referred today for presentation in the multidisciplinary conference.  Radiology studies and pathology slides were presented there for review and discussion of treatment options.  A consensus was discussed regarding potential next steps.  On review of systems, the patient reports shortness of breath with exertion. He reports not sleeping well since diagnosis.  PREVIOUS RADIATION THERAPY: No  PAST MEDICAL HISTORY:  has a past medical history of Adenomatous colon polyp, Anxiety, Blood transfusion without reported diagnosis, Cancer ([place]), COPD (chronic obstructive pulmonary disease) ([place]), Gastric  ulcer, GERD (gastroesophageal reflux disease), High cholesterol, and Hypertension.    PAST SURGICAL HISTORY: Past Surgical History:  Procedure Laterality Date  . COLONOSCOPY  2008  . ESOPHAGOGASTRODUODENOSCOPY  11/23/2011   Procedure: ESOPHAGOGASTRODUODENOSCOPY (EGD);  Surgeon: Owens Loffler, MD;  Location: [facility];  Service: Endoscopy;  Laterality: N/A;  . LUNG BIOPSY  12/18/2018   Procedure: LUNG BIOPSY - of Left Upper Lobe Biopsy of #7 Node Biopsy of #10L Node;  Surgeon: Grace Isaac, MD;  Location: [place];  Service: Thoracic;;  . UPPER GASTROINTESTINAL ENDOSCOPY  11/23/11  . VIDEO BRONCHOSCOPY WITH ENDOBRONCHIAL NAVIGATION N/A 12/18/2018   Procedure: VIDEO BRONCHOSCOPY WITH ENDOBRONCHIAL NAVIGATION with Placement of Fiducial Markers x 3;  Surgeon: Grace Isaac, MD;  Location: [place];  Service: Thoracic;  Laterality: N/A;  . VIDEO BRONCHOSCOPY WITH ENDOBRONCHIAL ULTRASOUND N/A 12/18/2018   Procedure: VIDEO BRONCHOSCOPY WITH ENDOBRONCHIAL ULTRASOUND;  Surgeon: Grace Isaac, MD;  Location: [place];  Service: Thoracic;  Laterality: N/A;    FAMILY HISTORY: family history includes Heart disease in his father; Stomach cancer in his mother.  SOCIAL HISTORY:  reports that he quit smoking about 8 weeks ago. His smoking use included cigarettes. He has a 1.25 pack-year smoking history. He has never used smokeless tobacco. He reports that he does not drink alcohol or use drugs. Resides in [place]. Retired from [employer].  ALLERGIES: Patient has no known allergies.  MEDICATIONS:  Current Outpatient Medications  Medication Sig Dispense Refill  . albuterol (PROVENTIL HFA;VENTOLIN HFA) 108 (90 Base) MCG/ACT inhaler Inhale 2 puffs into the lungs every 6 (six) hours as needed for wheezing or shortness of breath. Reported on 06/02/2016    . amLODipine (NORVASC)  10 MG tablet Take 10 mg by mouth daily.     Marland Kitchen esomeprazole (NEXIUM) 40 MG capsule Take 40 mg by mouth daily.     Marland Kitchen  losartan (COZAAR) 50 MG tablet Take 50 mg by mouth daily.     . rosuvastatin (CRESTOR) 10 MG tablet Take 10 mg by mouth daily.    . Tiotropium Bromide-Olodaterol 2.5-2.5 MCG/ACT AERS Inhale 1 puff into the lungs daily.     Current Facility-Administered Medications  Medication Dose Route Frequency Provider Last Rate Last Dose  . 0.9 %  sodium chloride infusion  500 mL Intravenous Continuous Milus Banister, MD        REVIEW OF SYSTEMS:  REVIEW OF SYSTEMS: A 10+ POINT REVIEW OF SYSTEMS WAS OBTAINED including neurology, dermatology, psychiatry, cardiac, respiratory, lymph, extremities, GI, GU, musculoskeletal, constitutional, reproductive, HEENT. All pertinent positives are noted in the HPI. All others are negative.   PHYSICAL EXAM:  Vitals with BMI 12/27/2018  Height 5\' 8"   Weight 170 lbs 6 oz  BMI 75.64  Systolic 332  Diastolic 59  Pulse 55  Respirations 18   General: Alert and oriented, in no acute distress HEENT: Head is normocephalic. Extraocular movements are intact. Hard of hearing. Oropharynx is clear. Upper dentures. Neck: Neck is supple, no palpable cervical or supraclavicular lymphadenopathy. Heart: Regular in rate and rhythm with no murmurs, rubs, or gallops. Chest: Clear to auscultation bilaterally, with no rhonchi, wheezes, or rales. Abdomen: Soft, nontender, nondistended, with no rigidity or guarding. Extremities: No cyanosis or edema. Lymphatics: see Neck Exam Skin: No concerning lesions. Musculoskeletal: symmetric strength and muscle tone throughout. Neurologic: Cranial nerves II through XII are grossly intact. No obvious focalities. Speech is fluent. Coordination is intact. Psychiatric: Judgment and insight are intact. Affect is appropriate.   KPS = 90  100 - Normal; no complaints; no evidence of disease. 90   - Able to carry on normal activity; minor signs or symptoms of disease. 80   - Normal activity with effort; some signs or symptoms of disease. 27   -  Cares for self; unable to carry on normal activity or to do active work. 60   - Requires occasional assistance, but is able to care for most of his personal needs. 50   - Requires considerable assistance and frequent medical care. 56   - Disabled; requires special care and assistance. 25   - Severely disabled; hospital admission is indicated although death not imminent. 15   - Very sick; hospital admission necessary; active supportive treatment necessary. 10   - Moribund; fatal processes progressing rapidly. 0     - Dead  Karnofsky DA, Abelmann [place], Craver LS and Burchenal [facility] [phone]) The use of the nitrogen mustards in the palliative treatment of carcinoma: with particular reference to bronchogenic carcinoma Cancer 1 634-56  LABORATORY DATA:  Lab Results  Component Value Date   WBC 6.9 12/27/2018   HGB 13.1 12/27/2018   HCT 38.6 (L) 12/27/2018   MCV 93.9 12/27/2018   PLT 142 (L) 12/27/2018   Lab Results  Component Value Date   NA 140 12/27/2018   K 3.9 12/27/2018   CL 107 12/27/2018   CO2 25 12/27/2018   Lab Results  Component Value Date   ALT 26 12/27/2018   AST 14 (L) 12/27/2018   ALKPHOS 64 12/27/2018   BILITOT 0.4 12/27/2018    PULMONARY FUNCTION TEST:   Recent Review Flowsheet Data    Spirometry Latest Ref Rng & Units 04/20/2016  FVC-%PRED-PRE % 49   FVC-%PRED-POST % 61   FEV1-PRE L 1.30   FEV1-%PRED-PRE % 40   FEV1-POST L 1.59   FEV1-%PRED-POST % 50   DLCO [facility] ml/min/mmHg 19.71      RADIOGRAPHY: Dg Chest 2 View  Result Date: 12/18/2018 CLINICAL DATA:  Preop for lung biopsy EXAM: CHEST - 2 VIEW COMPARISON:  PET-CT of 12/11/2018, and chest x-ray of 08/27/2015 FINDINGS: The anterior inferior left upper lobe lung nodule noted by CT is vaguely seen on chest x-ray. Otherwise the lungs appear clear, and somewhat hyperaerated. No infiltrate or effusion is seen. Mediastinal and hilar contours are unremarkable. The heart is within normal limits in size. There are mild  degenerative changes in the mid to lower thoracic spine. IMPRESSION: The left upper lobe pulmonary nodule is not as well seen by chest x-ray but is vaguely present on the frontal view. Slight hyper aeration remains. Electronically Signed   By: Ivar Drape M.D.   On: 12/18/2018 08:04   Nm Pet Image Initial (pi) Skull Base To Thigh  Result Date: 12/11/2018 CLINICAL DATA:  Initial treatment strategy for pulmonary nodule. EXAM: NUCLEAR MEDICINE PET SKULL BASE TO THIGH TECHNIQUE: 8.8 mCi F-18 FDG was injected intravenously. Full-ring PET imaging was performed from the skull base to thigh after the radiotracer. CT data was obtained and used for attenuation correction and anatomic localization. Fasting blood glucose: 103 mg/dl COMPARISON:  Chest CT 12 17 19  FINDINGS: Mediastinal blood pool activity: SUV max 2.2 NECK: No hypermetabolic lymph nodes in the neck. Incidental CT findings: none CHEST: Spiculated nodule in the LEFT upper lobe measuring 15 mm is unchanged in size from 15 mm in short interval (CT 11/20/2018). This nodule is intensely hypermetabolic with SUV max equal 7.6. No additional hypermetabolic pulmonary nodules. There is a hypermetabolic lymph node at the LEFT hilum with SUV max equal 8.1. This lymph node is not well-defined on the noncontrast CT. Small LEFT lower paratracheal lymph node measuring only several mm (image 74/5) has significant radiotracer activity for size with SUV max equal 4.2. No contralateral hypermetabolic lymph nodes. Incidental CT findings: Coronary artery calcification and aortic atherosclerotic calcification. ABDOMEN/PELVIS: No abnormal hypermetabolic activity within the liver, pancreas, adrenal glands, or spleen. No hypermetabolic lymph nodes in the abdomen or pelvis. Incidental CT findings: Enlarged prostate gland. Atherosclerotic calcification of the aorta. SKELETON: No focal hypermetabolic activity to suggest skeletal metastasis. Incidental CT findings: none IMPRESSION: 1.  Hypermetabolic LEFT upper lobe pulmonary nodule. Hypermetabolic LEFT hilar lymph node and potential ipsilateral mediastinal lymph node. 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 ( T1b N2 m0). 2. If multidisciplinary follow up management is desired, this is available in the [place] through the Multidisciplinary Thoracic Clinic [phone]. These results will be called to the ordering clinician or representative by the Radiologist Assistant, and communication documented in the PACS or zVision Dashboard. Electronically Signed   By: Suzy Bouchard M.D.   On: 12/11/2018 14:39   Dg C-arm Bronchoscopy  Result Date: 12/18/2018 C-ARM BRONCHOSCOPY: Fluoroscopy was utilized by the requesting physician.  No radiographic interpretation.      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 He may be a potential surgical candidate and will see Dr. Servando Snare later today to make this determination. If surgery is not planned, proceed with planning radiation with tentative start date of February 3rd. Recommend 6  weeks of radiation therapy along with radiosensitizing chemotherapy.   Today, I talked to the patient and his wife about the findings and work-up thus far.  We discussed the natural history of NSCLC and general treatment, highlighting the role of external beam radiotherapy in the management.  We discussed the available radiation techniques, and focused on the details of logistics and delivery.  We reviewed the anticipated acute and late sequelae associated with radiation in this setting.  The patient was encouraged to ask questions that I answered to the best of my ability.  PLAN: Thoracic surgical evaluation.  MRI of the brain to complete staging workup.      ------------------------------------------------  Blair Promise, PhD, MD  This document serves as a record of services personally performed by Gery Pray, MD. It was created on his behalf by Rae Lips, a trained medical scribe. The creation of this record is based on the scribe's personal observations and the provider's statements to them. This document has been checked and approved by the attending provider.

## 2018-12-27 NOTE — Progress Notes (Signed)
[address]       [place] [address]             [phone]                    Keishawn T Dapper Hart Medical Record #[id number] Date of Birth: July 30, 1946  Referring: Curt Bears, MD Primary Care: [name], MD Primary Cardiologist: No primary care provider on file.  Chief Complaint:    No chief complaint on file.   History of Present Illness:    Jack Taylor 73 y.o. male is seen in the office  today for follow-up after recent navigation bronchoscopy and biopsy to evaluate left upper lobe lung nodule.  Patient originally had an abnormal screening CT of the chest.  He has a long history of smoking, but is stopped for the last month.  He also worked many years in [employer].      Current Activity/ Functional Status:  Patient is independent with mobility/ambulation, transfers, ADL's, IADL's.   Zubrod Score: At the time of surgery this patient's most appropriate activity status/level should be described as: []     0    Normal activity, no symptoms [x]     1    Restricted in physical strenuous activity but ambulatory, able to do out light work []     2    Ambulatory and capable of self care, unable to do work activities, up and about               >50 % of waking hours                              []     3    Only limited self care, in bed greater than 50% of waking hours []     4    Completely disabled, no self care, confined to bed or chair []     5    Moribund   Past Medical History:  Diagnosis Date  . Adenomatous colon polyp   . Anxiety   . Blood transfusion without reported diagnosis   . Cancer ([place])    Lung; 11/2018  . COPD (chronic obstructive pulmonary disease) ([place])   . Gastric ulcer   . GERD (gastroesophageal reflux disease)   . High cholesterol   . Hypertension     Past Surgical History:  Procedure Laterality Date  . COLONOSCOPY  2008  . ESOPHAGOGASTRODUODENOSCOPY  11/23/2011   Procedure: ESOPHAGOGASTRODUODENOSCOPY  (EGD);  Surgeon: Owens Loffler, MD;  Location: [facility];  Service: Endoscopy;  Laterality: N/A;  . LUNG BIOPSY  12/18/2018   Procedure: LUNG BIOPSY - of Left Upper Lobe Biopsy of #7 Node Biopsy of #10L Node;  Surgeon: Grace Isaac, MD;  Location: [facility];  Service: Thoracic;;  . UPPER GASTROINTESTINAL ENDOSCOPY  11/23/11  . VIDEO BRONCHOSCOPY WITH ENDOBRONCHIAL NAVIGATION N/A 12/18/2018   Procedure: VIDEO BRONCHOSCOPY WITH ENDOBRONCHIAL NAVIGATION with Placement of Fiducial Markers x 3;  Surgeon: Grace Isaac, MD;  Location: [facility];  Service: Thoracic;  Laterality: N/A;  . VIDEO BRONCHOSCOPY WITH ENDOBRONCHIAL ULTRASOUND N/A 12/18/2018   Procedure: VIDEO BRONCHOSCOPY WITH ENDOBRONCHIAL ULTRASOUND;  Surgeon: Grace Isaac, MD;  Location: [facility];  Service: Thoracic;  Laterality: N/A;    Family History  Problem Relation Age of Onset  . Stomach cancer Mother   . Heart disease Father   . Colon cancer  Neg Hx      Social History   Tobacco Use  Smoking Status Former Smoker  . Packs/day: 0.25  . Years: 5.00  . Pack years: 1.25  . Types: Cigarettes  . Last attempt to quit: 11/04/2018  . Years since quitting: 0.1  Smokeless Tobacco Never Used  Tobacco Comment   recently quit when diagnosed with lung cancer.     Social History   Substance and Sexual Activity  Alcohol Use No     No Known Allergies  Current Outpatient Medications  Medication Sig Dispense Refill  . albuterol (PROVENTIL HFA;VENTOLIN HFA) 108 (90 Base) MCG/ACT inhaler Inhale 2 puffs into the lungs every 6 (six) hours as needed for wheezing or shortness of breath. Reported on 06/02/2016    . amLODipine (NORVASC) 10 MG tablet Take 10 mg by mouth daily.     Marland Kitchen esomeprazole (NEXIUM) 40 MG capsule Take 40 mg by mouth daily.     Marland Kitchen losartan (COZAAR) 50 MG tablet Take 50 mg by mouth daily.     . rosuvastatin (CRESTOR) 10 MG tablet Take 10 mg by mouth daily.    . Tiotropium Bromide-Olodaterol 2.5-2.5 MCG/ACT AERS  Inhale 1 puff into the lungs daily.     Current Facility-Administered Medications  Medication Dose Route Frequency Provider Last Rate Last Dose  . 0.9 %  sodium chloride infusion  500 mL Intravenous Continuous Milus Banister, MD        Pertinent items are noted in HPI.   Review of Systems:     Cardiac Review of Systems: [Y] = yes  or   [ N ] = no   Chest Pain n  Resting SOB [ n  ] Exertional SOB  [ y ]  Orthopnea [ n ]   Pedal Edema [ n ]    Palpitations [ ]  Syncope  [  ]   Presyncope [ n  ]   Review of Systems  Constitutional: Negative.   HENT: Positive for hearing loss. Negative for congestion, ear discharge, ear pain, nosebleeds, sinus pain, sore throat and tinnitus.   Eyes: Negative.   Respiratory: Negative for cough, hemoptysis, sputum production, shortness of breath, wheezing and stridor.   Cardiovascular: Negative for chest pain, palpitations, orthopnea, claudication, leg swelling and PND.  Gastrointestinal: Negative.  Abdominal pain: YPHYSICALEXAM.  Genitourinary: Negative.   Musculoskeletal: Negative.   Skin: Negative.   Neurological: Negative.   Endo/Heme/Allergies: Negative.   Psychiatric/Behavioral: Negative.       PHYSICAL EXAMINATION: BP (!) 120/59   Pulse (!) 55   Temp 98.3 F (36.8 C) (Oral)   Resp 18   Wt 170 lb 4 oz (77.2 kg)   SpO2 98%   BMI 25.89 kg/m   General appearance: alert, cooperative and no distress Head: Normocephalic, without obvious abnormality, atraumatic Neck: no adenopathy, no carotid bruit, no JVD, supple, symmetrical, trachea midline and thyroid not enlarged, symmetric, no tenderness/mass/nodules Lymph nodes: Cervical, supraclavicular, and axillary nodes normal. Resp: clear to auscultation bilaterally Back: symmetric, no curvature. ROM normal. No CVA tenderness. Cardio: regular rate and rhythm, S1, S2 normal, no murmur, click, rub or gallop GI: soft, non-tender; bowel sounds normal; no masses,  no organomegaly Extremities:  extremities normal, atraumatic, no cyanosis or edema and Homans sign is negative, no sign of DVT Neurologic: Grossly normal    Diagnostic Studies & Laboratory data:     Recent Radiology Findings:   Nm Pet Image Initial (pi) Skull Base To Thigh  Result Date: 12/11/2018 CLINICAL  DATA:  Initial treatment strategy for pulmonary nodule. EXAM: NUCLEAR MEDICINE PET SKULL BASE TO THIGH TECHNIQUE: 8.8 mCi F-18 FDG was injected intravenously. Full-ring PET imaging was performed from the skull base to thigh after the radiotracer. CT data was obtained and used for attenuation correction and anatomic localization. Fasting blood glucose: 103 mg/dl COMPARISON:  Chest CT 12 17 19  FINDINGS: Mediastinal blood pool activity: SUV max 2.2 NECK: No hypermetabolic lymph nodes in the neck. Incidental CT findings: none CHEST: Spiculated nodule in the LEFT upper lobe measuring 15 mm is unchanged in size from 15 mm in short interval (CT 11/20/2018). This nodule is intensely hypermetabolic with SUV max equal 7.6. No additional hypermetabolic pulmonary nodules. There is a hypermetabolic lymph node at the LEFT hilum with SUV max equal 8.1. This lymph node is not well-defined on the noncontrast CT. Small LEFT lower paratracheal lymph node measuring only several mm (image 74/5) has significant radiotracer activity for size with SUV max equal 4.2. No contralateral hypermetabolic lymph nodes. Incidental CT findings: Coronary artery calcification and aortic atherosclerotic calcification. ABDOMEN/PELVIS: No abnormal hypermetabolic activity within the liver, pancreas, adrenal glands, or spleen. No hypermetabolic lymph nodes in the abdomen or pelvis. Incidental CT findings: Enlarged prostate gland. Atherosclerotic calcification of the aorta. SKELETON: No focal hypermetabolic activity to suggest skeletal metastasis. Incidental CT findings: none IMPRESSION: 1. Hypermetabolic LEFT upper lobe pulmonary nodule. Hypermetabolic LEFT hilar lymph node  and potential ipsilateral mediastinal lymph node. 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 ( T1b N2 m0). 2. If multidisciplinary follow up management is desired, this is available in the [facility] through the Multid[facility] [id number]. These results will be called to the ordering clinician or representative by the Radiologist Assistant, and communication documented in the PACS or zVision Dashboard. Electronically Signed   By: Suzy Bouchard M.D.   On: 12/11/2018 14:39   Ct Chest Lung Ca Screen Low Dose W/o Cm  Result Date: 11/20/2018 CLINICAL DATA:  74 year old asymptomatic male current smoker with 49 pack-year smoking history. EXAM: CT CHEST WITHOUT CONTRAST LOW-DOSE FOR LUNG CANCER SCREENING TECHNIQUE: Multidetector CT imaging of the chest was performed following the standard protocol without IV contrast. COMPARISON:  11/22/2011 chest CT angiogram. 08/27/2015 chest radiograph. FINDINGS: Cardiovascular: Normal heart size. No significant pericardial effusion/thickening. Three-vessel coronary atherosclerosis. Atherosclerotic nonaneurysmal thoracic aorta. Normal caliber pulmonary arteries. Mediastinum/Nodes: No discrete thyroid nodules. Unremarkable esophagus. No pathologically enlarged axillary, mediastinal or hilar lymph nodes, noting limited sensitivity for the detection of hilar adenopathy on this noncontrast study. Lungs/Pleura: No pneumothorax. No pleural effusion. Mild centrilobular emphysema with diffuse bronchial wall thickening. No acute consolidative airspace disease or lung masses. Several solid pulmonary nodules throughout both lungs. The dominant pulmonary nodule is a spiculated solid left upper lobe pulmonary nodule measuring 15.1 mm in volume derived mean diameter (series 9/image 171). Upper abdomen: No acute abnormality. Musculoskeletal: No aggressive appearing focal osseous lesions. Moderate thoracic spondylosis. IMPRESSION: 1. Lung-RADS 4B,  suspicious. Dominant spiculated solid 15.1 mm left upper lobe pulmonary nodule, highly suspicious for primary bronchogenic carcinoma. Additional imaging evaluation or consultation with Pulmonology or Thoracic Surgery recommended. 2. No thoracic adenopathy evident on this noncontrast low-dose CT. 3. Three-vessel coronary atherosclerosis. Aortic Atherosclerosis (ICD10-I70.0) and Emphysema (ICD10-J43.9). These results will be called to the ordering clinician or representative by the Radiologist Assistant, and communication documented in the PACS or zVision Dashboard. Electronically Signed   By: Ilona Sorrel M.D.   On: 11/20/2018 14:47     I have independently reviewed the  above radiology studies  and reviewed the findings with the patient.   Recent Lab Findings: Lab Results  Component Value Date   WBC 6.9 12/27/2018   HGB 13.1 12/27/2018   HCT 38.6 (L) 12/27/2018   PLT 142 (L) 12/27/2018   GLUCOSE 211 (H) 12/27/2018   ALT 26 12/27/2018   AST 14 (L) 12/27/2018   NA 140 12/27/2018   K 3.9 12/27/2018   CL 107 12/27/2018   CREATININE 1.49 (H) 12/27/2018   BUN 14 12/27/2018   CO2 25 12/27/2018   INR 1.03 12/17/2018   PFT's  2017 FEV1  1.3 40% DLCO 19.71  62%  Chronic Kidney Disease   Stage I     GFR >90  Stage II    GFR 60-89  Stage IIIA GFR 45-59  Stage IIIB GFR 30-44  Stage IV   GFR 15-29  Stage V    GFR  <15  Lab Results  Component Value Date   CREATININE 1.49 (H) 12/27/2018   Estimated Creatinine Clearance: 43.4 mL/min (A) (by C-G formula based on SCr of 1.49 mg/dL (H)).  PATH: Diagnosis Lung, biopsy, Left Upper - NON-SMALL CELL CARCINOMA, SEE COMMENT. Microscopic Comment Immunohistochemistry for TTF-1 is positive. P63 and CK5/6 are negative. 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  Diagnosis FINE NEEDLE ASPIRATION ENDOSCOPIC (B) EBUS 7 NODE (SPECIMEN 2 OF 5, COLLECTED 12/18/2018): NO MALIGNANT CELLS IDENTIFIED. LYMPHOID TISSUE  PRESENT.  Assessment / Plan:   1/Left upper lobe lung mass - radiographic evidence of clinical stage III lung cancer c (T1b,N2,M0)- I have reviewed the clinical diagnosis with the patient and his wife.  Bronch, EBUS and navigation bronchoscopy  Has been done to stage and get a tissue dx. findings reviewed in the multidisciplinary thoracic oncology conference and patient seen this afternoon by radiation and medical oncology.  On review of the findings including the path, the most appropriate clinical staging appears cStage IIB vs poss  cStage IIIA   (t1b, n1/n2 ,mo) .  On review of the PET scan the consensus was an over read of N2 node positivity.  As discussed with the patient as has medical and radiation oncology the options of treatment.  Although the patient does have underlying COPD, he remains active without significant symptoms and would best be served with proceeding with surgical resection, surgical staging and consider chemotherapy postoperatively should he have positive N1 nodes.  The risks and expectations of surgery were discussed with the patient and his wife in detail.  The risk of death infection bleeding blood transfusion, prolonged air leak, need for chest tubes and the general time course and recovery have been discussed with the patient and his wife.  They are willing to proceed.  The patient has not been smoking for the past month.  We will repeat current PFTs previous ones were done last year.  We will plan to proceed Tuesday, January 28.   2/ Followed by pulmonary for underlying COPD   3/ chronic kidney disease Stage iiiB     Grace Isaac MD      [address] [place] [address] Office [phone]   Beeper [phone]  12/28/2018 9:44 AM

## 2018-12-27 NOTE — Progress Notes (Signed)
START ON PATHWAY REGIMEN - Non-Small Cell Lung     Administer weekly:     Paclitaxel      Carboplatin   **Always confirm dose/schedule in your pharmacy ordering system**  Patient Characteristics: Stage III - Unresectable, PS = 0, 1 AJCC T Category: T1b Current Disease Status: No Distant Mets or Local Recurrence AJCC N Category: N2 AJCC M Category: M0 AJCC 8 Stage Grouping: IIIA Performance Status: PS = 0, 1 Intent of Therapy: Curative Intent, Discussed with Patient 

## 2018-12-28 ENCOUNTER — Other Ambulatory Visit: Payer: Self-pay | Admitting: *Deleted

## 2018-12-28 DIAGNOSIS — R918 Other nonspecific abnormal finding of lung field: Secondary | ICD-10-CM

## 2018-12-28 NOTE — Pre-Procedure Instructions (Signed)
MARRELL DICAPRIO  12/28/2018      [facility], [place] - [address] [place]  [address] Phone: [phone] Fax: [phone]    Your procedure is scheduled on Tuesday, January 28th.  Report to [facility] Admitting at 7:50 A.M.  Call this number if you have problems the morning of surgery:  [phone]   Remember:  Do not eat or drink after midnight.    Take these medicines the morning of surgery with A SIP OF WATER  amLODipine (NORVASC) esomeprazole (NEXIUM) Inhalers; please bring your rescue inhaler (albuterol) with you on the day of surgery.   As of today, STOP taking any Aspirin (unless otherwise instructed by your surgeon), Aleve, Naproxen, Ibuprofen, Motrin, Advil, Goody's, BC's, all herbal medications, fish oil, and all vitamins.     Do not wear jewelry..  Do not wear lotions, powders, or colognes, or deodorant.  Men may shave face and neck.  Do not bring valuables to the hospital.  [facility] is not responsible for any belongings or valuables.  Contacts, dentures or bridgework may not be worn into surgery.  Leave your suitcase in the car.  After surgery it may be brought to your room.  For patients admitted to the hospital, discharge time will be determined by your treatment team.  Patients discharged the day of surgery will not be allowed to drive home.   Special instructions:   [facility]- Preparing For Surgery  Before surgery, you can play an important role. Because skin is not sterile, your skin needs to be as free of germs as possible. You can reduce the number of germs on your skin by washing with CHG (chlorahexidine gluconate) Soap before surgery.  CHG is an antiseptic cleaner which kills germs and bonds with the skin to continue killing germs even after washing.    Oral Hygiene is also important to reduce your risk of infection.  Remember - BRUSH YOUR TEETH THE MORNING OF SURGERY WITH YOUR REGULAR  TOOTHPASTE  Please do not use if you have an allergy to CHG or antibacterial soaps. If your skin becomes reddened/irritated stop using the CHG.  Do not shave (including legs and underarms) for at least 48 hours prior to first CHG shower. It is OK to shave your face.  Please follow these instructions carefully.   1. Shower the NIGHT BEFORE SURGERY and the MORNING OF SURGERY with CHG.   2. If you chose to wash your hair, wash your hair first as usual with your normal shampoo.  3. After you shampoo, rinse your hair and body thoroughly to remove the shampoo.  4. Use CHG as you would any other liquid soap. You can apply CHG directly to the skin and wash gently with a scrungie or a clean washcloth.   5. Apply the CHG Soap to your body ONLY FROM THE NECK DOWN.  Do not use on open wounds or open sores. Avoid contact with your eyes, ears, mouth and genitals (private parts). Wash Face and genitals (private parts)  with your normal soap.  6. Wash thoroughly, paying special attention to the area where your surgery will be performed.  7. Thoroughly rinse your body with warm water from the neck down.  8. DO NOT shower/wash with your normal soap after using and rinsing off the CHG Soap.  9. Pat yourself dry with a CLEAN TOWEL.  10. Wear CLEAN PAJAMAS to bed the night before surgery, wear  comfortable clothes the morning of surgery  11. Place CLEAN SHEETS on your bed the night of your first shower and DO NOT SLEEP WITH PETS.    Day of Surgery:  Do not apply any deodorants/lotions.  Please wear clean clothes to the hospital/surgery center.   Remember to brush your teeth WITH YOUR REGULAR TOOTHPASTE.  Please read over the following fact sheets that you were given.

## 2018-12-31 ENCOUNTER — Ambulatory Visit (HOSPITAL_COMMUNITY)
Admission: RE | Admit: 2018-12-31 | Discharge: 2018-12-31 | Disposition: A | Discharge status: Home / Self Care | Payer: PPO | Source: Ambulatory Visit | Attending: Cardiothoracic Surgery | Admitting: Cardiothoracic Surgery

## 2018-12-31 ENCOUNTER — Other Ambulatory Visit: Payer: Self-pay

## 2018-12-31 ENCOUNTER — Encounter (HOSPITAL_COMMUNITY)
Admission: RE | Admit: 2018-12-31 | Discharge: 2018-12-31 | Disposition: A | Discharge status: Home / Self Care | Payer: PPO | Source: Ambulatory Visit | Attending: Cardiothoracic Surgery | Admitting: Cardiothoracic Surgery

## 2018-12-31 ENCOUNTER — Encounter (HOSPITAL_COMMUNITY): Payer: Self-pay

## 2018-12-31 DIAGNOSIS — D696 Thrombocytopenia, unspecified: Secondary | ICD-10-CM | POA: Diagnosis not present

## 2018-12-31 DIAGNOSIS — J449 Chronic obstructive pulmonary disease, unspecified: Secondary | ICD-10-CM | POA: Diagnosis not present

## 2018-12-31 DIAGNOSIS — J9811 Atelectasis: Secondary | ICD-10-CM | POA: Diagnosis not present

## 2018-12-31 DIAGNOSIS — Z4682 Encounter for fitting and adjustment of non-vascular catheter: Secondary | ICD-10-CM | POA: Diagnosis not present

## 2018-12-31 DIAGNOSIS — R918 Other nonspecific abnormal finding of lung field: Secondary | ICD-10-CM

## 2018-12-31 DIAGNOSIS — Q211 Atrial septal defect: Secondary | ICD-10-CM | POA: Diagnosis not present

## 2018-12-31 DIAGNOSIS — D62 Acute posthemorrhagic anemia: Secondary | ICD-10-CM | POA: Diagnosis not present

## 2018-12-31 DIAGNOSIS — R7303 Prediabetes: Secondary | ICD-10-CM | POA: Diagnosis present

## 2018-12-31 DIAGNOSIS — Z86010 Personal history of colonic polyps: Secondary | ICD-10-CM | POA: Diagnosis not present

## 2018-12-31 DIAGNOSIS — R350 Frequency of micturition: Secondary | ICD-10-CM | POA: Diagnosis present

## 2018-12-31 DIAGNOSIS — R338 Other retention of urine: Secondary | ICD-10-CM | POA: Diagnosis not present

## 2018-12-31 DIAGNOSIS — J90 Pleural effusion, not elsewhere classified: Secondary | ICD-10-CM | POA: Diagnosis not present

## 2018-12-31 DIAGNOSIS — I2510 Atherosclerotic heart disease of native coronary artery without angina pectoris: Secondary | ICD-10-CM | POA: Diagnosis not present

## 2018-12-31 DIAGNOSIS — I129 Hypertensive chronic kidney disease with stage 1 through stage 4 chronic kidney disease, or unspecified chronic kidney disease: Secondary | ICD-10-CM | POA: Diagnosis not present

## 2018-12-31 DIAGNOSIS — I700 Atherosclerosis of aorta: Secondary | ICD-10-CM | POA: Diagnosis not present

## 2018-12-31 DIAGNOSIS — Z8711 Personal history of peptic ulcer disease: Secondary | ICD-10-CM | POA: Diagnosis not present

## 2018-12-31 DIAGNOSIS — J9383 Other pneumothorax: Secondary | ICD-10-CM | POA: Diagnosis not present

## 2018-12-31 DIAGNOSIS — I973 Postprocedural hypertension: Secondary | ICD-10-CM | POA: Diagnosis not present

## 2018-12-31 DIAGNOSIS — E7800 Pure hypercholesterolemia, unspecified: Secondary | ICD-10-CM | POA: Diagnosis not present

## 2018-12-31 DIAGNOSIS — F17211 Nicotine dependence, cigarettes, in remission: Secondary | ICD-10-CM | POA: Diagnosis present

## 2018-12-31 DIAGNOSIS — I10 Essential (primary) hypertension: Secondary | ICD-10-CM | POA: Diagnosis not present

## 2018-12-31 DIAGNOSIS — K5900 Constipation, unspecified: Secondary | ICD-10-CM | POA: Diagnosis not present

## 2018-12-31 DIAGNOSIS — D72828 Other elevated white blood cell count: Secondary | ICD-10-CM | POA: Diagnosis not present

## 2018-12-31 DIAGNOSIS — F419 Anxiety disorder, unspecified: Secondary | ICD-10-CM | POA: Diagnosis present

## 2018-12-31 DIAGNOSIS — K219 Gastro-esophageal reflux disease without esophagitis: Secondary | ICD-10-CM | POA: Diagnosis present

## 2018-12-31 DIAGNOSIS — N183 Chronic kidney disease, stage 3 (moderate): Secondary | ICD-10-CM | POA: Diagnosis not present

## 2018-12-31 DIAGNOSIS — H9190 Unspecified hearing loss, unspecified ear: Secondary | ICD-10-CM | POA: Diagnosis present

## 2018-12-31 DIAGNOSIS — R000 Tachycardia, unspecified: Secondary | ICD-10-CM | POA: Diagnosis not present

## 2018-12-31 DIAGNOSIS — C3492 Malignant neoplasm of unspecified part of left bronchus or lung: Secondary | ICD-10-CM | POA: Diagnosis not present

## 2018-12-31 DIAGNOSIS — C3412 Malignant neoplasm of upper lobe, left bronchus or lung: Secondary | ICD-10-CM | POA: Diagnosis not present

## 2018-12-31 HISTORY — DX: Chronic kidney disease, unspecified: N18.9

## 2018-12-31 HISTORY — DX: Prediabetes: R73.03

## 2018-12-31 LAB — BLOOD GAS, ARTERIAL
Acid-Base Excess: 2.6 mmol/L — ABNORMAL HIGH (ref 0.0–2.0)
Bicarbonate: 26.7 mmol/L (ref 20.0–28.0)
Drawn by: 421801
FIO2: 21
O2 Saturation: 95.9 %
pCO2 arterial: 41.5 mmHg (ref 32.0–48.0)
pH, Arterial: 7.424 (ref 7.350–7.450)
pO2, Arterial: 80.9 mmHg — ABNORMAL LOW (ref 83.0–108.0)

## 2018-12-31 LAB — COMPREHENSIVE METABOLIC PANEL
ALT: 24 U/L (ref 0–44)
AST: 18 U/L (ref 15–41)
Albumin: 3.7 g/dL (ref 3.5–5.0)
Alkaline Phosphatase: 53 U/L (ref 38–126)
Anion gap: 7 (ref 5–15)
BUN: 15 mg/dL (ref 8–23)
CO2: 24 mmol/L (ref 22–32)
Calcium: 9.2 mg/dL (ref 8.9–10.3)
Chloride: 107 mmol/L (ref 98–111)
Creatinine, Ser: 1.61 mg/dL — ABNORMAL HIGH (ref 0.61–1.24)
GFR calc Af Amer: 49 mL/min — ABNORMAL LOW (ref >60)
GFR calc non Af Amer: 42 mL/min — ABNORMAL LOW (ref >60)
Glucose, Bld: 113 mg/dL — ABNORMAL HIGH (ref 70–99)
Potassium: 3.7 mmol/L (ref 3.5–5.1)
Sodium: 138 mmol/L (ref 135–145)
Total Bilirubin: 0.7 mg/dL (ref 0.3–1.2)
Total Protein: 6.4 g/dL — ABNORMAL LOW (ref 6.5–8.1)

## 2018-12-31 LAB — PULMONARY FUNCTION TEST
DL/VA % pred: 86 %
DL/VA: 3.93 ml/min/mmHg/L
DLCO cor % pred: 54 %
DLCO cor: 17.02 ml/min/mmHg
DLCO unc % pred: 52 %
DLCO unc: 16.25 ml/min/mmHg
FEF 25-75 Post: 1.18 L/sec
FEF 25-75 Pre: 0.58 L/sec
FEF2575-%Change-Post: 102 %
FEF2575-%Pred-Post: 51 %
FEF2575-%Pred-Pre: 25 %
FEV1-%Change-Post: 25 %
FEV1-%Pred-Post: 55 %
FEV1-%Pred-Pre: 44 %
FEV1-Post: 1.69 L
FEV1-Pre: 1.35 L
FEV1FVC-%Change-Post: 4 %
FEV1FVC-%Pred-Pre: 75 %
FEV6-%Change-Post: 21 %
FEV6-%Pred-Post: 69 %
FEV6-%Pred-Pre: 57 %
FEV6-Post: 2.73 L
FEV6-Pre: 2.25 L
FEV6FVC-%Change-Post: 1 %
FEV6FVC-%Pred-Post: 100 %
FEV6FVC-%Pred-Pre: 98 %
FVC-%Change-Post: 19 %
FVC-%Pred-Post: 69 %
FVC-%Pred-Pre: 58 %
FVC-Post: 2.9 L
FVC-Pre: 2.43 L
Post FEV1/FVC ratio: 58 %
Post FEV6/FVC ratio: 94 %
Pre FEV1/FVC ratio: 56 %
Pre FEV6/FVC Ratio: 93 %
RV % pred: 175 %
RV: 4.27 L
TLC % pred: 97 %
TLC: 6.63 L

## 2018-12-31 LAB — URINALYSIS, ROUTINE W REFLEX MICROSCOPIC
Bilirubin Urine: NEGATIVE
Glucose, UA: NEGATIVE mg/dL
Hgb urine dipstick: NEGATIVE
Ketones, ur: NEGATIVE mg/dL
Leukocytes, UA: NEGATIVE
Nitrite: NEGATIVE
Protein, ur: NEGATIVE mg/dL
Specific Gravity, Urine: 1.017 (ref 1.005–1.030)
pH: 6 (ref 5.0–8.0)

## 2018-12-31 LAB — CBC
HCT: 39.3 % (ref 39.0–52.0)
Hemoglobin: 13.3 g/dL (ref 13.0–17.0)
MCH: 31.7 pg (ref 26.0–34.0)
MCHC: 33.8 g/dL (ref 30.0–36.0)
MCV: 93.8 fL (ref 80.0–100.0)
Platelets: 145 10*3/uL — ABNORMAL LOW (ref 150–400)
RBC: 4.19 MIL/uL — ABNORMAL LOW (ref 4.22–5.81)
RDW: 12.6 % (ref 11.5–15.5)
WBC: 7.3 10*3/uL (ref 4.0–10.5)
nRBC: 0.3 % — ABNORMAL HIGH (ref 0.0–0.2)

## 2018-12-31 LAB — TYPE AND SCREEN
ABO/RH(D): A POS
Antibody Screen: NEGATIVE

## 2018-12-31 LAB — SURGICAL PCR SCREEN
MRSA, PCR: NEGATIVE
Staphylococcus aureus: NEGATIVE

## 2018-12-31 LAB — PROTIME-INR
INR: 1
Prothrombin Time: 13.1 seconds (ref 11.4–15.2)

## 2018-12-31 LAB — ABO/RH: ABO/RH(D): A POS

## 2018-12-31 LAB — APTT: aPTT: 33 seconds (ref 24–36)

## 2018-12-31 MED ORDER — ALBUTEROL SULFATE (2.5 MG/3ML) 0.083% IN NEBU
2.5000 mg | INHALATION_SOLUTION | Freq: Once | RESPIRATORY_TRACT | Status: AC
  Administered 2018-12-31: 2.5 mg via RESPIRATORY_TRACT

## 2018-12-31 NOTE — Progress Notes (Signed)
PCP -Dr. Terrill Mohr  Cardiologist - Dr. Einar Gip  Chest x-ray - 12/31/18 EKG - 12/17/18 Stress Test - 2015-[facility] ECHO - 2012-[facility] Cardiac Cath - 20+ years ago; reported to be clean.   Sleep Study - denies  Labs: CBC, CMP, PT/INR, APTT , T/S, ABG, UA  Aspirin Instructions:N/A  Anesthesia review: Yes, See Ebony Hail note 12/17/18  Patient denies shortness of breath, fever, cough and chest pain at PAT appointment   Patient verbalized understanding of instructions that were given to them at the PAT appointment. Patient was also instructed that they will need to review over the PAT instructions again at home before surgery.

## 2018-12-31 NOTE — Progress Notes (Addendum)
Anesthesia Chart Review:  Case:  [id number] Date/Time:  01/01/19 0942   Procedure:  VIDEO ASSISTED THORACOSCOPY (VATS)/LUNG RESECTION (Left Chest)   Anesthesia type:  General   Pre-op diagnosis:  LEFT LUNG MASS   Location:  [facility] OR ROOM 70 / [place] OR   Surgeon:  Grace Isaac, MD      DISCUSSION: Patient is a 73 year old male scheduled for the above procedure. He is s/p video bronchoscopy with EBUS on 12/18/18. 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 Dr. Servando Snare feels patient likely has clinical c stage IIB versus stage IIIA I lung cancer versus (T1b, N2, M0). Surgery recommended and if any positive lymph nodes then consideration of adjuvant chemotherapy.    History includes former smoker (quit 11/04/18), lung cancer (LUL, 11/2018), HTN, hypercholesterolemia, GERD, COPD. He did not report history of CKD (CKD stage III) or pre-diabetes, but these are both listed as diagnoses by [name], MD(see PCP visit 11/12/18, Care Everywhere), so they were added to his [facility] PMHx.    He saw cardiologist Dr. Einar Gip last in 12/2016 with PRN follow-up recommended. See Providers section.  Overall, I think his renal function appears stable (Cr 1.61). A1c 6.2 last month. If no acute changes then I anticipate that he can proceed as planned. 12/31/18 CXR result is still in process.   VS: BP (!) 131/56   Pulse (!) 56   Temp 36.7 C   Resp 18   Ht 5\' 8"  (1.727 m)   Wt 78 kg   SpO2 95%   BMI 26.15 kg/m    PROVIDERS: [name], MD is PCP. Last visit 11/12/18. ([place], see Care Everywhere). Curt Bears, MD is HEM-ONC. Chesley Noon, MD is RAD-ONC. Kela Millin, MD is cardiologist [facility]). Last visit 12/16/16 for follow-up dyspnea and coronary calcifications on prior CT scan. Testing as outlined below. He initially saw patient more frequently "to improve compliance." Smoking cessation encouraged. And breathing  improved on bronchodilator therapy. PRN cardiology follow-up with continued management by primary care recommended at his last visit.      LABS: Labs reviewed: Acceptable for surgery. A1c was 6.2 on 11/09/18 ([place]). (all labs ordered are listed, but only abnormal results are displayed)  Labs Reviewed  BLOOD GAS, ARTERIAL - Abnormal; Notable for the following components:      Result Value   pO2, Arterial 80.9 (*)    Acid-Base Excess 2.6 (*)    All other components within normal limits  CBC - Abnormal; Notable for the following components:   RBC 4.19 (*)    Platelets 145 (*)    nRBC 0.3 (*)    All other components within normal limits  COMPREHENSIVE METABOLIC PANEL - Abnormal; Notable for the following components:   Glucose, Bld 113 (*)    Creatinine, Ser 1.61 (*)    Total Protein 6.4 (*)    GFR calc non Af Amer 42 (*)    GFR calc Af Amer 49 (*)    All other components within normal limits  SURGICAL PCR SCREEN  APTT  PROTIME-INR  URINALYSIS, ROUTINE W REFLEX MICROSCOPIC  TYPE AND SCREEN   Lab Results  Component Value Date   CREATININE 1.61 (H) 12/31/2018   CREATININE 1.49 (H) 12/27/2018   CREATININE 1.55 (H) 12/17/2018    PFTs 12/31/18: FVC 2.43  58%, post 2.90  69%. FEV1 1.35  44%, post 1.69  55%. DLCO unc 16.25  52%, cor 17.02  54%.   IMAGES:  CXR 12/31/18: Report Pending.  PET scan 12/11/18: IMPRESSION: 1. Hypermetabolic LEFT upper lobe pulmonary nodule. Hypermetabolic LEFT hilar lymph node and potential ipsilateral mediastinal lymph node. 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 ( T1b N2 m0).  CT chest 11/20/18: IMPRESSION: 1. Lung-RADS 4B, suspicious. Dominant spiculated solid 15.1 mm left upper lobe pulmonary nodule, highly suspicious for primary bronchogenic carcinoma. Additional imaging evaluation or consultation with Pulmonology or Thoracic Surgery recommended. 2. No thoracic adenopathy evident on this noncontrast low-dose  CT. 3. Three-vessel coronary atherosclerosis. Aortic Atherosclerosis (ICD10-I70.0) and Emphysema (ICD10-J43.9).   EKG: 12/17/18: SB at 50 bpm. First degree AV block.   CV: Carotid US 09/01/15 [facility] CV): Conclusions: No hemodynamically significant arterial disease in the internal carotid artery bilaterally. Moderate amount of heterogeneous plaque noted in the right artery and mild plaque left.  Echo 01/01/15  [facility] CV): Conclusion:  1. Left ventricle cavity is normal is size. Mild concentric hypertrophy of the left ventricle. Normal global wall notion. Normal diastolic filling pattern. Calculated EF 58%. 2. Mild mitral regurgitation. 3. Trace tricuspid regurgitation. 4. Trace pulmonic regurgitation.  Nuclear stress test 12/03/14 [facility] CV): Impression: Myocardial perfusion imaging is normal. Overall the LV systolic function is normal without regional wall motion abnormalities. LVEF 67%.  He reported  "clean" cath over 20 years ago.   Past Medical History:  Diagnosis Date  . Adenomatous colon polyp   . Anxiety   . Blood transfusion without reported diagnosis   . Cancer ([place])    Lung; 11/2018  . COPD (chronic obstructive pulmonary disease) ([place])   . Gastric ulcer   . GERD (gastroesophageal reflux disease)   . High cholesterol   . Hypertension     Past Surgical History:  Procedure Laterality Date  . COLONOSCOPY  2008  . ESOPHAGOGASTRODUODENOSCOPY  11/23/2011   Procedure: ESOPHAGOGASTRODUODENOSCOPY (EGD);  Surgeon: Owens Loffler, MD;  Location: [facility];  Service: Endoscopy;  Laterality: N/A;  . LUNG BIOPSY  12/18/2018   Procedure: LUNG BIOPSY - of Left Upper Lobe Biopsy of #7 Node Biopsy of #10L Node;  Surgeon: Grace Isaac, MD;  Location: [place];  Service: Thoracic;;  . UPPER GASTROINTESTINAL ENDOSCOPY  11/23/11  . VIDEO BRONCHOSCOPY WITH ENDOBRONCHIAL NAVIGATION N/A 12/18/2018   Procedure: VIDEO BRONCHOSCOPY WITH ENDOBRONCHIAL NAVIGATION with  Placement of Fiducial Markers x 3;  Surgeon: Grace Isaac, MD;  Location: [place];  Service: Thoracic;  Laterality: N/A;  . VIDEO BRONCHOSCOPY WITH ENDOBRONCHIAL ULTRASOUND N/A 12/18/2018   Procedure: VIDEO BRONCHOSCOPY WITH ENDOBRONCHIAL ULTRASOUND;  Surgeon: Grace Isaac, MD;  Location: [place];  Service: Thoracic;  Laterality: N/A;    MEDICATIONS: . albuterol (PROVENTIL HFA;VENTOLIN HFA) 108 (90 Base) MCG/ACT inhaler  . amLODipine (NORVASC) 10 MG tablet  . esomeprazole (NEXIUM) 40 MG capsule  . losartan (COZAAR) 50 MG tablet  . rosuvastatin (CRESTOR) 10 MG tablet  . Tiotropium Bromide-Olodaterol 2.5-2.5 MCG/ACT AERS   . 0.9 %  sodium chloride infusion    Myra Gianotti, PA-C Surgical Short Stay/Anesthesiology [facility] Phone [phone] [facility] Phone [phone] 12/31/2018 2:18 PM

## 2019-01-01 ENCOUNTER — Encounter (HOSPITAL_COMMUNITY)
Admission: RE | Disposition: A | Discharge status: Home / Self Care | Payer: Self-pay | Source: Home / Self Care | Attending: Cardiothoracic Surgery

## 2019-01-01 ENCOUNTER — Encounter (HOSPITAL_COMMUNITY): Payer: Self-pay

## 2019-01-01 ENCOUNTER — Inpatient Hospital Stay (HOSPITAL_COMMUNITY)
Admission: RE | Admit: 2019-01-01 | Discharge: 2019-01-07 | DRG: 167 | Disposition: A | Discharge status: Home / Self Care | Payer: PPO | Attending: Cardiothoracic Surgery | Admitting: Cardiothoracic Surgery

## 2019-01-01 ENCOUNTER — Inpatient Hospital Stay (HOSPITAL_COMMUNITY): Payer: PPO | Admitting: Vascular Surgery

## 2019-01-01 ENCOUNTER — Inpatient Hospital Stay (HOSPITAL_COMMUNITY): Payer: PPO

## 2019-01-01 ENCOUNTER — Inpatient Hospital Stay (HOSPITAL_COMMUNITY): Payer: PPO | Admitting: Certified Registered Nurse Anesthetist

## 2019-01-01 DIAGNOSIS — R7303 Prediabetes: Secondary | ICD-10-CM | POA: Diagnosis present

## 2019-01-01 DIAGNOSIS — Z8711 Personal history of peptic ulcer disease: Secondary | ICD-10-CM | POA: Diagnosis not present

## 2019-01-01 DIAGNOSIS — D72828 Other elevated white blood cell count: Secondary | ICD-10-CM | POA: Diagnosis not present

## 2019-01-01 DIAGNOSIS — I2510 Atherosclerotic heart disease of native coronary artery without angina pectoris: Secondary | ICD-10-CM | POA: Diagnosis present

## 2019-01-01 DIAGNOSIS — I700 Atherosclerosis of aorta: Secondary | ICD-10-CM | POA: Diagnosis present

## 2019-01-01 DIAGNOSIS — K219 Gastro-esophageal reflux disease without esophagitis: Secondary | ICD-10-CM | POA: Diagnosis present

## 2019-01-01 DIAGNOSIS — Z79899 Other long term (current) drug therapy: Secondary | ICD-10-CM

## 2019-01-01 DIAGNOSIS — Z902 Acquired absence of lung [part of]: Secondary | ICD-10-CM

## 2019-01-01 DIAGNOSIS — Z9689 Presence of other specified functional implants: Secondary | ICD-10-CM

## 2019-01-01 DIAGNOSIS — J449 Chronic obstructive pulmonary disease, unspecified: Secondary | ICD-10-CM | POA: Diagnosis present

## 2019-01-01 DIAGNOSIS — J9383 Other pneumothorax: Secondary | ICD-10-CM | POA: Diagnosis not present

## 2019-01-01 DIAGNOSIS — H9190 Unspecified hearing loss, unspecified ear: Secondary | ICD-10-CM | POA: Diagnosis present

## 2019-01-01 DIAGNOSIS — Q211 Atrial septal defect: Secondary | ICD-10-CM | POA: Diagnosis not present

## 2019-01-01 DIAGNOSIS — N183 Chronic kidney disease, stage 3 (moderate): Secondary | ICD-10-CM | POA: Diagnosis present

## 2019-01-01 DIAGNOSIS — F17211 Nicotine dependence, cigarettes, in remission: Secondary | ICD-10-CM | POA: Diagnosis present

## 2019-01-01 DIAGNOSIS — Z86010 Personal history of colonic polyps: Secondary | ICD-10-CM | POA: Diagnosis not present

## 2019-01-01 DIAGNOSIS — D696 Thrombocytopenia, unspecified: Secondary | ICD-10-CM | POA: Diagnosis present

## 2019-01-01 DIAGNOSIS — I129 Hypertensive chronic kidney disease with stage 1 through stage 4 chronic kidney disease, or unspecified chronic kidney disease: Secondary | ICD-10-CM | POA: Diagnosis present

## 2019-01-01 DIAGNOSIS — R000 Tachycardia, unspecified: Secondary | ICD-10-CM | POA: Diagnosis not present

## 2019-01-01 DIAGNOSIS — K5900 Constipation, unspecified: Secondary | ICD-10-CM | POA: Diagnosis not present

## 2019-01-01 DIAGNOSIS — F419 Anxiety disorder, unspecified: Secondary | ICD-10-CM | POA: Diagnosis present

## 2019-01-01 DIAGNOSIS — Z09 Encounter for follow-up examination after completed treatment for conditions other than malignant neoplasm: Secondary | ICD-10-CM

## 2019-01-01 DIAGNOSIS — Z4682 Encounter for fitting and adjustment of non-vascular catheter: Secondary | ICD-10-CM

## 2019-01-01 DIAGNOSIS — E7800 Pure hypercholesterolemia, unspecified: Secondary | ICD-10-CM | POA: Diagnosis present

## 2019-01-01 DIAGNOSIS — R338 Other retention of urine: Secondary | ICD-10-CM | POA: Diagnosis not present

## 2019-01-01 DIAGNOSIS — Z8249 Family history of ischemic heart disease and other diseases of the circulatory system: Secondary | ICD-10-CM

## 2019-01-01 DIAGNOSIS — R350 Frequency of micturition: Secondary | ICD-10-CM | POA: Diagnosis present

## 2019-01-01 DIAGNOSIS — J939 Pneumothorax, unspecified: Secondary | ICD-10-CM

## 2019-01-01 DIAGNOSIS — C3412 Malignant neoplasm of upper lobe, left bronchus or lung: Secondary | ICD-10-CM | POA: Diagnosis present

## 2019-01-01 DIAGNOSIS — D62 Acute posthemorrhagic anemia: Secondary | ICD-10-CM | POA: Diagnosis not present

## 2019-01-01 DIAGNOSIS — L8990 Pressure ulcer of unspecified site, unspecified stage: Secondary | ICD-10-CM

## 2019-01-01 DIAGNOSIS — I973 Postprocedural hypertension: Secondary | ICD-10-CM | POA: Diagnosis not present

## 2019-01-01 DIAGNOSIS — R918 Other nonspecific abnormal finding of lung field: Secondary | ICD-10-CM

## 2019-01-01 DIAGNOSIS — Z800 Family history of malignant neoplasm of digestive organs: Secondary | ICD-10-CM

## 2019-01-01 HISTORY — PX: LYMPH NODE DISSECTION: SHX5087

## 2019-01-01 HISTORY — PX: INTERCOSTAL NERVE BLOCK: SHX5021

## 2019-01-01 HISTORY — PX: VIDEO ASSISTED THORACOSCOPY (VATS)/WEDGE RESECTION: SHX6174

## 2019-01-01 LAB — GLUCOSE, CAPILLARY: Glucose-Capillary: 152 mg/dL — ABNORMAL HIGH (ref 70–99)

## 2019-01-01 SURGERY — VIDEO ASSISTED THORACOSCOPY (VATS)/WEDGE RESECTION
Anesthesia: General | Site: Chest | Laterality: Left

## 2019-01-01 MED ORDER — ROSUVASTATIN CALCIUM 5 MG PO TABS
10.0000 mg | ORAL_TABLET | Freq: Every day | ORAL | Status: DC
  Administered 2019-01-01 – 2019-01-07 (×7): 10 mg via ORAL
  Filled 2019-01-01 (×7): qty 2

## 2019-01-01 MED ORDER — FENTANYL 40 MCG/ML IV SOLN
INTRAVENOUS | Status: DC
  Administered 2019-01-01: 1000 ug via INTRAVENOUS
  Administered 2019-01-01: 180 ug via INTRAVENOUS
  Administered 2019-01-02: 220 ug via INTRAVENOUS
  Administered 2019-01-02: 250 ug via INTRAVENOUS
  Administered 2019-01-02: 1000 ug via INTRAVENOUS
  Administered 2019-01-02: 210 ug via INTRAVENOUS
  Administered 2019-01-02: 150 ug via INTRAVENOUS
  Administered 2019-01-02: 260 ug via INTRAVENOUS
  Administered 2019-01-02: 180 ug via INTRAVENOUS
  Administered 2019-01-03: 160 ug via INTRAVENOUS
  Administered 2019-01-03: 240 ug via INTRAVENOUS
  Administered 2019-01-03: 230 ug via INTRAVENOUS
  Filled 2019-01-01 (×2): qty 25
  Filled 2019-01-01: qty 1000

## 2019-01-01 MED ORDER — ROCURONIUM BROMIDE 50 MG/5ML IV SOSY
PREFILLED_SYRINGE | INTRAVENOUS | Status: AC
  Filled 2019-01-01: qty 5

## 2019-01-01 MED ORDER — FENTANYL CITRATE (PF) 100 MCG/2ML IJ SOLN
INTRAMUSCULAR | Status: AC
  Administered 2019-01-01: 50 ug via INTRAVENOUS
  Filled 2019-01-01: qty 2

## 2019-01-01 MED ORDER — DEXAMETHASONE SODIUM PHOSPHATE 10 MG/ML IJ SOLN
INTRAMUSCULAR | Status: DC | PRN
  Administered 2019-01-01: 10 mg via INTRAVENOUS

## 2019-01-01 MED ORDER — SUGAMMADEX SODIUM 200 MG/2ML IV SOLN
INTRAVENOUS | Status: DC | PRN
  Administered 2019-01-01: 156 mg via INTRAVENOUS

## 2019-01-01 MED ORDER — SODIUM CHLORIDE 0.9 % IV SOLN
INTRAVENOUS | Status: DC
  Administered 2019-01-01 – 2019-01-02 (×3): via INTRAVENOUS

## 2019-01-01 MED ORDER — ROCURONIUM BROMIDE 50 MG/5ML IV SOSY
PREFILLED_SYRINGE | INTRAVENOUS | Status: AC
  Filled 2019-01-01: qty 15

## 2019-01-01 MED ORDER — ACETAMINOPHEN 500 MG PO TABS
1000.0000 mg | ORAL_TABLET | Freq: Four times a day (QID) | ORAL | Status: AC
  Administered 2019-01-01 – 2019-01-05 (×15): 1000 mg via ORAL
  Administered 2019-01-05: 500 mg via ORAL
  Administered 2019-01-06 (×3): 1000 mg via ORAL
  Filled 2019-01-01 (×19): qty 2

## 2019-01-01 MED ORDER — AMLODIPINE BESYLATE 10 MG PO TABS
10.0000 mg | ORAL_TABLET | Freq: Every day | ORAL | Status: DC
  Administered 2019-01-02 – 2019-01-07 (×6): 10 mg via ORAL
  Filled 2019-01-01 (×6): qty 1
  Filled 2019-01-01: qty 2

## 2019-01-01 MED ORDER — PHENYLEPHRINE 40 MCG/ML (10ML) SYRINGE FOR IV PUSH (FOR BLOOD PRESSURE SUPPORT)
PREFILLED_SYRINGE | INTRAVENOUS | Status: AC
  Filled 2019-01-01: qty 10

## 2019-01-01 MED ORDER — ARTIFICIAL TEARS OPHTHALMIC OINT
TOPICAL_OINTMENT | OPHTHALMIC | Status: AC
  Filled 2019-01-01: qty 7

## 2019-01-01 MED ORDER — CEFAZOLIN SODIUM-DEXTROSE 2-4 GM/100ML-% IV SOLN
INTRAVENOUS | Status: AC
  Filled 2019-01-01: qty 100

## 2019-01-01 MED ORDER — PANTOPRAZOLE SODIUM 40 MG PO TBEC
40.0000 mg | DELAYED_RELEASE_TABLET | Freq: Every day | ORAL | Status: DC
  Administered 2019-01-01 – 2019-01-07 (×7): 40 mg via ORAL
  Filled 2019-01-01 (×7): qty 1

## 2019-01-01 MED ORDER — BUPIVACAINE HCL (PF) 0.5 % IJ SOLN
INTRAMUSCULAR | Status: AC
  Filled 2019-01-01: qty 30

## 2019-01-01 MED ORDER — ONDANSETRON HCL 4 MG/2ML IJ SOLN
4.0000 mg | Freq: Four times a day (QID) | INTRAMUSCULAR | Status: DC | PRN
  Administered 2019-01-02 (×2): 4 mg via INTRAVENOUS
  Filled 2019-01-01 (×2): qty 2

## 2019-01-01 MED ORDER — DEXAMETHASONE SODIUM PHOSPHATE 10 MG/ML IJ SOLN
INTRAMUSCULAR | Status: AC
  Filled 2019-01-01: qty 1

## 2019-01-01 MED ORDER — TRAMADOL HCL 50 MG PO TABS
50.0000 mg | ORAL_TABLET | Freq: Two times a day (BID) | ORAL | Status: DC | PRN
  Administered 2019-01-02 – 2019-01-06 (×6): 50 mg via ORAL
  Filled 2019-01-01 (×6): qty 1

## 2019-01-01 MED ORDER — SODIUM CHLORIDE 0.9% FLUSH: 9.0000 mL | INTRAVENOUS | Status: DC | PRN

## 2019-01-01 MED ORDER — CEFAZOLIN SODIUM-DEXTROSE 2-4 GM/100ML-% IV SOLN
2.0000 g | Freq: Three times a day (TID) | INTRAVENOUS | Status: AC
  Administered 2019-01-01 – 2019-01-02 (×2): 2 g via INTRAVENOUS
  Filled 2019-01-01 (×4): qty 100

## 2019-01-01 MED ORDER — SENNOSIDES-DOCUSATE SODIUM 8.6-50 MG PO TABS
1.0000 | ORAL_TABLET | Freq: Every day | ORAL | Status: DC
  Administered 2019-01-01 – 2019-01-06 (×4): 1 via ORAL
  Filled 2019-01-01 (×4): qty 1

## 2019-01-01 MED ORDER — LIDOCAINE 2% (20 MG/ML) 5 ML SYRINGE
INTRAMUSCULAR | Status: AC
  Filled 2019-01-01: qty 5

## 2019-01-01 MED ORDER — NALOXONE HCL 0.4 MG/ML IJ SOLN: 0.4000 mg | INTRAMUSCULAR | Status: DC | PRN

## 2019-01-01 MED ORDER — FENTANYL CITRATE (PF) 100 MCG/2ML IJ SOLN
50.0000 ug | Freq: Once | INTRAMUSCULAR | Status: AC
  Administered 2019-01-01: 50 ug via INTRAVENOUS

## 2019-01-01 MED ORDER — LIDOCAINE 2% (20 MG/ML) 5 ML SYRINGE
INTRAMUSCULAR | Status: DC | PRN
  Administered 2019-01-01: 80 mg via INTRAVENOUS

## 2019-01-01 MED ORDER — BUPIVACAINE HCL 0.5 % IJ SOLN: INTRAMUSCULAR | Status: DC | PRN

## 2019-01-01 MED ORDER — BUPIVACAINE LIPOSOME 1.3 % IJ SUSP
20.0000 mL | INTRAMUSCULAR | Status: AC
  Administered 2019-01-01: 60 mL
  Filled 2019-01-01: qty 20

## 2019-01-01 MED ORDER — LEVALBUTEROL HCL 0.63 MG/3ML IN NEBU: 0.6300 mg | INHALATION_SOLUTION | Freq: Four times a day (QID) | RESPIRATORY_TRACT | Status: DC | PRN

## 2019-01-01 MED ORDER — DIPHENHYDRAMINE HCL 12.5 MG/5ML PO ELIX
12.5000 mg | ORAL_SOLUTION | Freq: Four times a day (QID) | ORAL | Status: DC | PRN
  Filled 2019-01-01: qty 5

## 2019-01-01 MED ORDER — ONDANSETRON HCL 4 MG/2ML IJ SOLN
INTRAMUSCULAR | Status: AC
  Filled 2019-01-01: qty 2

## 2019-01-01 MED ORDER — BISACODYL 5 MG PO TBEC
10.0000 mg | DELAYED_RELEASE_TABLET | Freq: Every day | ORAL | Status: DC
  Administered 2019-01-01 – 2019-01-04 (×4): 10 mg via ORAL
  Filled 2019-01-01 (×8): qty 2

## 2019-01-01 MED ORDER — INSULIN ASPART 100 UNIT/ML ~~LOC~~ SOLN
0.0000 [IU] | Freq: Four times a day (QID) | SUBCUTANEOUS | Status: DC
  Administered 2019-01-01 – 2019-01-02 (×2): 2 [IU] via SUBCUTANEOUS

## 2019-01-01 MED ORDER — ROCURONIUM BROMIDE 10 MG/ML (PF) SYRINGE
PREFILLED_SYRINGE | INTRAVENOUS | Status: DC | PRN
  Administered 2019-01-01 (×3): 20 mg via INTRAVENOUS
  Administered 2019-01-01: 10 mg via INTRAVENOUS
  Administered 2019-01-01: 20 mg via INTRAVENOUS
  Administered 2019-01-01: 50 mg via INTRAVENOUS

## 2019-01-01 MED ORDER — HEMOSTATIC AGENTS (NO CHARGE) OPTIME
TOPICAL | Status: DC | PRN
  Administered 2019-01-01: 1 via TOPICAL

## 2019-01-01 MED ORDER — PHENYLEPHRINE 40 MCG/ML (10ML) SYRINGE FOR IV PUSH (FOR BLOOD PRESSURE SUPPORT)
PREFILLED_SYRINGE | INTRAVENOUS | Status: DC | PRN
  Administered 2019-01-01: 120 ug via INTRAVENOUS

## 2019-01-01 MED ORDER — LACTATED RINGERS IV SOLN
INTRAVENOUS | Status: DC | PRN
  Administered 2019-01-01: 10:00:00 via INTRAVENOUS

## 2019-01-01 MED ORDER — MIDAZOLAM HCL 2 MG/2ML IJ SOLN
INTRAMUSCULAR | Status: AC
  Administered 2019-01-01: 1 mg via INTRAVENOUS
  Filled 2019-01-01: qty 2

## 2019-01-01 MED ORDER — ONDANSETRON HCL 4 MG/2ML IJ SOLN
INTRAMUSCULAR | Status: DC | PRN
  Administered 2019-01-01: 4 mg via INTRAVENOUS

## 2019-01-01 MED ORDER — MIDAZOLAM HCL 2 MG/2ML IJ SOLN
1.0000 mg | Freq: Once | INTRAMUSCULAR | Status: AC
  Administered 2019-01-01: 1 mg via INTRAVENOUS

## 2019-01-01 MED ORDER — PROPOFOL 10 MG/ML IV BOLUS
INTRAVENOUS | Status: DC | PRN
  Administered 2019-01-01: 130 mg via INTRAVENOUS
  Administered 2019-01-01: 30 mg via INTRAVENOUS

## 2019-01-01 MED ORDER — DIPHENHYDRAMINE HCL 50 MG/ML IJ SOLN: 12.5000 mg | Freq: Four times a day (QID) | INTRAMUSCULAR | Status: DC | PRN

## 2019-01-01 MED ORDER — MIDAZOLAM HCL 2 MG/2ML IJ SOLN
INTRAMUSCULAR | Status: AC
  Filled 2019-01-01: qty 2

## 2019-01-01 MED ORDER — ARFORMOTEROL TARTRATE 15 MCG/2ML IN NEBU
15.0000 ug | INHALATION_SOLUTION | Freq: Two times a day (BID) | RESPIRATORY_TRACT | Status: DC
  Administered 2019-01-01 – 2019-01-07 (×12): 15 ug via RESPIRATORY_TRACT
  Filled 2019-01-01 (×12): qty 2

## 2019-01-01 MED ORDER — FENTANYL CITRATE (PF) 250 MCG/5ML IJ SOLN
INTRAMUSCULAR | Status: AC
  Filled 2019-01-01: qty 5

## 2019-01-01 MED ORDER — DEXAMETHASONE SODIUM PHOSPHATE 10 MG/ML IJ SOLN
INTRAMUSCULAR | Status: AC
  Filled 2019-01-01: qty 2

## 2019-01-01 MED ORDER — 0.9 % SODIUM CHLORIDE (POUR BTL) OPTIME
TOPICAL | Status: DC | PRN
  Administered 2019-01-01: 1000 mL

## 2019-01-01 MED ORDER — SODIUM CHLORIDE (PF) 0.9 % IJ SOLN: INTRAMUSCULAR | Status: DC | PRN

## 2019-01-01 MED ORDER — FENTANYL CITRATE (PF) 250 MCG/5ML IJ SOLN
INTRAMUSCULAR | Status: DC | PRN
  Administered 2019-01-01 (×2): 50 ug via INTRAVENOUS
  Administered 2019-01-01: 100 ug via INTRAVENOUS
  Administered 2019-01-01 (×3): 50 ug via INTRAVENOUS

## 2019-01-01 MED ORDER — ACETAMINOPHEN 160 MG/5ML PO SOLN: 1000.0000 mg | Freq: Four times a day (QID) | ORAL | Status: AC

## 2019-01-01 MED ORDER — UMECLIDINIUM BROMIDE 62.5 MCG/INH IN AEPB
1.0000 | INHALATION_SPRAY | Freq: Every day | RESPIRATORY_TRACT | Status: DC
  Administered 2019-01-01 – 2019-01-07 (×6): 1 via RESPIRATORY_TRACT
  Filled 2019-01-01 (×2): qty 7

## 2019-01-01 MED ORDER — CEFAZOLIN SODIUM-DEXTROSE 2-4 GM/100ML-% IV SOLN
2.0000 g | INTRAVENOUS | Status: AC
  Administered 2019-01-01: 2 g via INTRAVENOUS

## 2019-01-01 MED ORDER — OXYCODONE HCL 5 MG PO TABS
5.0000 mg | ORAL_TABLET | ORAL | Status: DC | PRN
  Administered 2019-01-01 – 2019-01-05 (×13): 5 mg via ORAL
  Filled 2019-01-01 (×13): qty 1

## 2019-01-01 SURGICAL SUPPLY — 98 items
ADH SKN CLS APL DERMABOND .7 (GAUZE/BANDAGES/DRESSINGS) ×1
APL SRG 22X2 LUM MLBL SLNT (VASCULAR PRODUCTS)
APL SRG 7X2 LUM MLBL SLNT (VASCULAR PRODUCTS)
APPLICATOR TIP COSEAL (VASCULAR PRODUCTS) IMPLANT
APPLICATOR TIP EXT COSEAL (VASCULAR PRODUCTS) IMPLANT
BLADE SURG 11 STRL SS (BLADE) IMPLANT
CANISTER SUCT 3000ML PPV (MISCELLANEOUS) ×3 IMPLANT
CATH KIT ON Q 5IN SLV (PAIN MANAGEMENT) IMPLANT
CATH THORACIC 28FR (CATHETERS) IMPLANT
CATH THORACIC 36FR (CATHETERS) IMPLANT
CATH THORACIC 36FR RT ANG (CATHETERS) IMPLANT
CLIP VESOCCLUDE MED 24/CT (CLIP) ×2 IMPLANT
CLIP VESOCCLUDE MED 6/CT (CLIP) ×1 IMPLANT
CONN ST 1/4X3/8  BEN (MISCELLANEOUS)
CONN ST 1/4X3/8 BEN (MISCELLANEOUS) IMPLANT
CONT SPEC 4OZ CLIKSEAL STRL BL (MISCELLANEOUS) ×8 IMPLANT
COVER WAND RF STERILE (DRAPES) ×1 IMPLANT
CUTTER ECHEON FLEX ENDO 45 340 (ENDOMECHANICALS) ×2 IMPLANT
DERMABOND ADVANCED (GAUZE/BANDAGES/DRESSINGS) ×2
DERMABOND ADVANCED .7 DNX12 (GAUZE/BANDAGES/DRESSINGS) IMPLANT
DISSECTOR BLUNT TIP ENDO 5MM (MISCELLANEOUS) IMPLANT
DRAIN CHANNEL 28F RND 3/8 FF (WOUND CARE) IMPLANT
DRAIN CHANNEL 32F RND 10.7 FF (WOUND CARE) IMPLANT
DRAPE LAPAROSCOPIC ABDOMINAL (DRAPES) ×3 IMPLANT
DRILL BIT 7/64X5 (BIT) IMPLANT
ELECT BLADE 4.0 EZ CLEAN MEGAD (MISCELLANEOUS) ×3
ELECT BLADE 6.5 EXT (BLADE) ×3 IMPLANT
ELECT REM PT RETURN 9FT ADLT (ELECTROSURGICAL) ×3
ELECTRODE BLDE 4.0 EZ CLN MEGD (MISCELLANEOUS) ×1 IMPLANT
ELECTRODE REM PT RTRN 9FT ADLT (ELECTROSURGICAL) ×1 IMPLANT
FELT TEFLON 1X6 (MISCELLANEOUS) ×2 IMPLANT
GAUZE SPONGE 4X4 12PLY STRL (GAUZE/BANDAGES/DRESSINGS) ×3 IMPLANT
GLOVE BIO SURGEON STRL SZ 6.5 (GLOVE) ×4 IMPLANT
GLOVE BIO SURGEONS STRL SZ 6.5 (GLOVE) ×2
GOWN STRL REUS W/ TWL LRG LVL3 (GOWN DISPOSABLE) ×4 IMPLANT
GOWN STRL REUS W/TWL LRG LVL3 (GOWN DISPOSABLE) ×12
KIT BASIN OR (CUSTOM PROCEDURE TRAY) ×3 IMPLANT
KIT SUCTION CATH 14FR (SUCTIONS) ×3 IMPLANT
KIT TURNOVER KIT B (KITS) ×3 IMPLANT
NDL SPNL 22GX3.5 QUINCKE BK (NEEDLE) IMPLANT
NEEDLE SPNL 22GX3.5 QUINCKE BK (NEEDLE) ×3 IMPLANT
NS IRRIG 1000ML POUR BTL (IV SOLUTION) ×6 IMPLANT
PACK CHEST (CUSTOM PROCEDURE TRAY) ×3 IMPLANT
PAD ARMBOARD 7.5X6 YLW CONV (MISCELLANEOUS) ×7 IMPLANT
PASSER SUT SWANSON 36MM LOOP (INSTRUMENTS) ×2 IMPLANT
POUCH ENDO CATCH II 15MM (MISCELLANEOUS) ×2 IMPLANT
PROGEL SPRAY TIP 11IN (MISCELLANEOUS) ×3
RELOAD STAPLE 35X2.5 WHT THIN (STAPLE) IMPLANT
RELOAD STAPLE 45 4.1 GRN THCK (STAPLE) IMPLANT
RELOAD STAPLE 45 GOLD REG/THCK (STAPLE) IMPLANT
SCISSORS LAP 5X35 DISP (ENDOMECHANICALS) IMPLANT
SEALANT PROGEL (MISCELLANEOUS) ×2 IMPLANT
SEALANT SURG COSEAL 4ML (VASCULAR PRODUCTS) IMPLANT
SEALANT SURG COSEAL 8ML (VASCULAR PRODUCTS) IMPLANT
SOLUTION ANTI FOG 6CC (MISCELLANEOUS) ×3 IMPLANT
SPONGE TONSIL TAPE 1 RFD (DISPOSABLE) IMPLANT
STAPLE RELOAD 2.5MM WHITE (STAPLE) ×6 IMPLANT
STAPLE RELOAD 45 GRN (STAPLE) ×1 IMPLANT
STAPLE RELOAD 45MM GOLD (STAPLE) ×24 IMPLANT
STAPLE RELOAD 45MM GREEN (STAPLE) ×3
STAPLER VASCULAR ECHELON 35 (CUTTER) ×2 IMPLANT
STOPCOCK 4 WAY LG BORE MALE ST (IV SETS) ×2 IMPLANT
SUT PROLENE 3 0 SH DA (SUTURE) IMPLANT
SUT PROLENE 4 0 RB 1 (SUTURE) ×3
SUT PROLENE 4-0 RB1 .5 CRCL 36 (SUTURE) IMPLANT
SUT SILK  1 MH (SUTURE) ×2
SUT SILK 1 MH (SUTURE) ×4 IMPLANT
SUT SILK 1 TIES 10X30 (SUTURE) IMPLANT
SUT SILK 2 0 SH (SUTURE) IMPLANT
SUT SILK 2 0SH CR/8 30 (SUTURE) IMPLANT
SUT SILK 3 0SH CR/8 30 (SUTURE) IMPLANT
SUT STEEL 1 (SUTURE) IMPLANT
SUT VIC AB 0 CTX 18 (SUTURE) ×3 IMPLANT
SUT VIC AB 1 CTX 18 (SUTURE) IMPLANT
SUT VIC AB 1 CTX 36 (SUTURE)
SUT VIC AB 1 CTX36XBRD ANBCTR (SUTURE) IMPLANT
SUT VIC AB 2-0 CTX 36 (SUTURE) IMPLANT
SUT VIC AB 2-0 UR6 27 (SUTURE) IMPLANT
SUT VIC AB 3-0 SH 8-18 (SUTURE) IMPLANT
SUT VIC AB 3-0 X1 27 (SUTURE) IMPLANT
SUT VICRYL 0 UR6 27IN ABS (SUTURE) IMPLANT
SUT VICRYL 2 TP 1 (SUTURE) IMPLANT
SYR 10ML LL (SYRINGE) ×2 IMPLANT
SYSTEM SAHARA CHEST DRAIN ATS (WOUND CARE) ×1 IMPLANT
SYSTEM SAHARA CHEST DRAIN RE-I (WOUND CARE) ×2 IMPLANT
TAPE CLOTH SURG 4X10 WHT LF (GAUZE/BANDAGES/DRESSINGS) ×2 IMPLANT
TAPE UMBILICAL COTTON 1/8X30 (MISCELLANEOUS) ×2 IMPLANT
TIP SPRAY PROGEL 11IN (MISCELLANEOUS) IMPLANT
TOWEL GREEN STERILE (TOWEL DISPOSABLE) ×3 IMPLANT
TOWEL GREEN STERILE FF (TOWEL DISPOSABLE) ×3 IMPLANT
TRAP SPECIMEN MUCOUS 40CC (MISCELLANEOUS) IMPLANT
TRAY FOLEY MTR SLVR 16FR STAT (SET/KITS/TRAYS/PACK) ×3 IMPLANT
TROCAR BLADELESS 12MM (ENDOMECHANICALS) IMPLANT
TROCAR XCEL 12X100 BLDLESS (ENDOMECHANICALS) IMPLANT
TROCAR XCEL BLUNT TIP 100MML (ENDOMECHANICALS) ×2 IMPLANT
TUBING EXTENTION W/L.L. (IV SETS) ×4 IMPLANT
TUNNELER SHEATH ON-Q 11GX8 DSP (PAIN MANAGEMENT) IMPLANT
WATER STERILE IRR 1000ML POUR (IV SOLUTION) ×3 IMPLANT

## 2019-01-01 NOTE — H&P (Signed)
[address]       [place] [address]             [phone]                    Kenlee T Fernando  Medical Record #[id number] Date of Birth: 1946/10/05  Referring: [name], MD Primary Care: [name], MD Primary Cardiologist: Dr Einar Gip   Chief Complaint:        Chief Complaint  Patient presents with  . Lung Lesion    Surgical eval, Chest CT 11/20/18, PET Scan 12/11/2018, PFT's 04/20/2016     History of Present Illness:    JAYKE CAUL 73 y.o. male is seen in the office  today for follow-up after recent navigation bronchoscopy and biopsy to evaluate left upper lobe lung nodule.  Patient originally had an abnormal screening CT of the chest.  He has a long history of smoking, but is stopped for the last month.  He also worked many years in [employer].      Current Activity/ Functional Status:  Patient is independent with mobility/ambulation, transfers, ADL's, IADL's.   Zubrod Score: At the time of surgery this patient's most appropriate activity status/level should be described as: []     0    Normal activity, no symptoms [x]     1    Restricted in physical strenuous activity but ambulatory, able to do out light work []     2    Ambulatory and capable of self care, unable to do work activities, up and about               >50 % of waking hours                              []     3    Only limited self care, in bed greater than 50% of waking hours []     4    Completely disabled, no self care, confined to bed or chair []     5    Moribund   Past Medical History:  Diagnosis Date  . Adenomatous colon polyp   . Anxiety   . Blood transfusion without reported diagnosis   . Cancer ([place])    Lung; 11/2018  . CKD (chronic kidney disease)    stage III 11/09/18  . COPD (chronic obstructive pulmonary disease) ([place])   . Gastric ulcer   . GERD (gastroesophageal reflux disease)   . High cholesterol   . Hypertension   . Pre-diabetes    A1c 6.2% 11/09/18    Past Surgical History:  Procedure Laterality Date  . COLONOSCOPY  2008  . ESOPHAGOGASTRODUODENOSCOPY  11/23/2011   Procedure: ESOPHAGOGASTRODUODENOSCOPY (EGD);  Surgeon: Owens Loffler, MD;  Location: [facility];  Service: Endoscopy;  Laterality: N/A;  . LUNG BIOPSY  12/18/2018   Procedure: LUNG BIOPSY - of Left Upper Lobe Biopsy of #7 Node Biopsy of #10L Node;  Surgeon: Grace Isaac, MD;  Location: [place];  Service: Thoracic;;  . UPPER GASTROINTESTINAL ENDOSCOPY  11/23/11  . VIDEO BRONCHOSCOPY WITH ENDOBRONCHIAL NAVIGATION N/A 12/18/2018   Procedure: VIDEO BRONCHOSCOPY WITH ENDOBRONCHIAL NAVIGATION with Placement of Fiducial Markers x 3;  Surgeon: Grace Isaac, MD;  Location: [place];  Service: Thoracic;  Laterality: N/A;  . VIDEO BRONCHOSCOPY WITH ENDOBRONCHIAL ULTRASOUND N/A 12/18/2018   Procedure: VIDEO BRONCHOSCOPY WITH ENDOBRONCHIAL ULTRASOUND;  Surgeon: Grace Isaac, MD;  Location: [facility];  Service: Thoracic;  Laterality: N/A;    Family History  Problem Relation Age of Onset  . Stomach cancer Mother   . Heart disease Father   . Colon cancer Neg Hx      Social History   Tobacco Use  Smoking Status Former Smoker  . Packs/day: 0.25  . Years: 5.00  . Pack years: 1.25  . Types: Cigarettes  . Last attempt to quit: 11/04/2018  . Years since quitting: 0.1  Smokeless Tobacco Never Used  Tobacco Comment   recently quit when diagnosed with lung cancer.     Social History   Substance and Sexual Activity  Alcohol Use No     No Known Allergies  Current Facility-Administered Medications  Medication Dose Route Frequency Provider Last Rate Last Dose  . ceFAZolin (ANCEF) 2-4 GM/100ML-% IVPB           . ceFAZolin (ANCEF) IVPB 2g/100 mL premix  2 g Intravenous 30 min Pre-Op Grace Isaac, MD      . fentaNYL (SUBLIMAZE) 100 MCG/2ML injection           . midazolam (VERSED) 2 MG/2ML injection             Pertinent items are noted in HPI.    Review of Systems:     Cardiac Review of Systems: [Y] = yes  or   [ N ] = no   Chest Pain n  Resting SOB [ n  ] Exertional SOB  [ y ]  Orthopnea [ n ]   Pedal Edema [ n ]    Palpitations [ ]  Syncope  [  ]   Presyncope [ n  ]   Review of Systems  Constitutional: Negative.   HENT: Positive for hearing loss. Negative for congestion, ear discharge, ear pain, nosebleeds, sinus pain, sore throat and tinnitus.   Eyes: Negative.   Respiratory: Negative for cough, hemoptysis, sputum production, shortness of breath, wheezing and stridor.   Cardiovascular: Negative for chest pain, palpitations, orthopnea, claudication, leg swelling and PND.  Gastrointestinal: Negative.  Abdominal pain: YPHYSICALEXAM.  Genitourinary: Negative.   Musculoskeletal: Negative.   Skin: Negative.   Neurological: Negative.   Endo/Heme/Allergies: Negative.   Psychiatric/Behavioral: Negative.       PHYSICAL EXAMINATION: BP (!) 146/74   Pulse (!) 58   Temp 97.8 F (36.6 C) (Oral)   Resp 18   Ht 5\' 8"  (1.727 m)   Wt 78 kg   SpO2 96%   BMI 26.15 kg/m   General appearance: alert, cooperative and no distress Head: Normocephalic, without obvious abnormality, atraumatic Neck: no adenopathy, no carotid bruit, no JVD, supple, symmetrical, trachea midline and thyroid not enlarged, symmetric, no tenderness/mass/nodules Lymph nodes: Cervical, supraclavicular, and axillary nodes normal. Resp: clear to auscultation bilaterally Back: symmetric, no curvature. ROM normal. No CVA tenderness. Cardio: regular rate and rhythm, S1, S2 normal, no murmur, click, rub or gallop GI: soft, non-tender; bowel sounds normal; no masses,  no organomegaly Extremities: extremities normal, atraumatic, no cyanosis or edema and Homans sign is negative, no sign of DVT Neurologic: Grossly normal    Diagnostic Studies & Laboratory data:     Recent Radiology Findings:  Dg Chest 2 View  Result Date: 12/31/2018 CLINICAL DATA:  Left lung mass.   Pre-op respiratory exam EXAM: CHEST - 2 VIEW COMPARISON:  12/17/2018 FINDINGS: The heart size and mediastinal contours are within normal limits. Aortic atherosclerosis. Subtle nodular opacity is  again seen in the left midlung. Seed localizers are seen in the lingula, surrounding this nodule. Lungs are otherwise clear. No evidence of pleural effusion. The visualized skeletal structures are unremarkable. IMPRESSION: Stable subtle pulmonary nodule in lingula.  No acute findings. Electronically Signed   By: Earle Gell M.D.   On: 12/31/2018 19:07    Nm Pet Image Initial (pi) Skull Base To Thigh  Result Date: 12/11/2018 CLINICAL DATA:  Initial treatment strategy for pulmonary nodule. EXAM: NUCLEAR MEDICINE PET SKULL BASE TO THIGH TECHNIQUE: 8.8 mCi F-18 FDG was injected intravenously. Full-ring PET imaging was performed from the skull base to thigh after the radiotracer. CT data was obtained and used for attenuation correction and anatomic localization. Fasting blood glucose: 103 mg/dl COMPARISON:  Chest CT 12 17 19  FINDINGS: Mediastinal blood pool activity: SUV max 2.2 NECK: No hypermetabolic lymph nodes in the neck. Incidental CT findings: none CHEST: Spiculated nodule in the LEFT upper lobe measuring 15 mm is unchanged in size from 15 mm in short interval (CT 11/20/2018). This nodule is intensely hypermetabolic with SUV max equal 7.6. No additional hypermetabolic pulmonary nodules. There is a hypermetabolic lymph node at the LEFT hilum with SUV max equal 8.1. This lymph node is not well-defined on the noncontrast CT. Small LEFT lower paratracheal lymph node measuring only several mm (image 74/5) has significant radiotracer activity for size with SUV max equal 4.2. No contralateral hypermetabolic lymph nodes. Incidental CT findings: Coronary artery calcification and aortic atherosclerotic calcification. ABDOMEN/PELVIS: No abnormal hypermetabolic activity within the liver, pancreas, adrenal glands, or spleen. No  hypermetabolic lymph nodes in the abdomen or pelvis. Incidental CT findings: Enlarged prostate gland. Atherosclerotic calcification of the aorta. SKELETON: No focal hypermetabolic activity to suggest skeletal metastasis. Incidental CT findings: none IMPRESSION: 1. Hypermetabolic LEFT upper lobe pulmonary nodule. Hypermetabolic LEFT hilar lymph node and potential ipsilateral mediastinal lymph node. 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 ( T1b N2 m0). 2. If multidisciplinary follow up management is desired, this is available in the [place] through the Multidisciplinary Thoracic [facility] [phone]. These results will be called to the ordering clinician or representative by the Radiologist Assistant, and communication documented in the PACS or zVision Dashboard. Electronically Signed   By: Suzy Bouchard M.D.   On: 12/11/2018 14:39   Ct Chest Lung Ca Screen Low Dose W/o Cm  Result Date: 11/20/2018 CLINICAL DATA:  73 year old asymptomatic male current smoker with 49 pack-year smoking history. EXAM: CT CHEST WITHOUT CONTRAST LOW-DOSE FOR LUNG CANCER SCREENING TECHNIQUE: Multidetector CT imaging of the chest was performed following the standard protocol without IV contrast. COMPARISON:  11/22/2011 chest CT angiogram. 08/27/2015 chest radiograph. FINDINGS: Cardiovascular: Normal heart size. No significant pericardial effusion/thickening. Three-vessel coronary atherosclerosis. Atherosclerotic nonaneurysmal thoracic aorta. Normal caliber pulmonary arteries. Mediastinum/Nodes: No discrete thyroid nodules. Unremarkable esophagus. No pathologically enlarged axillary, mediastinal or hilar lymph nodes, noting limited sensitivity for the detection of hilar adenopathy on this noncontrast study. Lungs/Pleura: No pneumothorax. No pleural effusion. Mild centrilobular emphysema with diffuse bronchial wall thickening. No acute consolidative airspace disease or lung masses. Several solid pulmonary  nodules throughout both lungs. The dominant pulmonary nodule is a spiculated solid left upper lobe pulmonary nodule measuring 15.1 mm in volume derived mean diameter (series 9/image 171). Upper abdomen: No acute abnormality. Musculoskeletal: No aggressive appearing focal osseous lesions. Moderate thoracic spondylosis. IMPRESSION: 1. Lung-RADS 4B, suspicious. Dominant spiculated solid 15.1 mm left upper lobe pulmonary nodule, highly suspicious for primary bronchogenic carcinoma. Additional imaging evaluation or consultation  with Pulmonology or Thoracic Surgery recommended. 2. No thoracic adenopathy evident on this noncontrast low-dose CT. 3. Three-vessel coronary atherosclerosis. Aortic Atherosclerosis (ICD10-I70.0) and Emphysema (ICD10-J43.9). These results will be called to the ordering clinician or representative by the Radiologist Assistant, and communication documented in the PACS or zVision Dashboard. Electronically Signed   By: Ilona Sorrel M.D.   On: 11/20/2018 14:47     I have independently reviewed the above radiology studies  and reviewed the findings with the patient.   Recent Lab Findings: Lab Results  Component Value Date   WBC 7.3 12/31/2018   HGB 13.3 12/31/2018   HCT 39.3 12/31/2018   PLT 145 (L) 12/31/2018   GLUCOSE 113 (H) 12/31/2018   ALT 24 12/31/2018   AST 18 12/31/2018   NA 138 12/31/2018   K 3.7 12/31/2018   CL 107 12/31/2018   CREATININE 1.61 (H) 12/31/2018   BUN 15 12/31/2018   CO2 24 12/31/2018   INR 1.00 12/31/2018   PFT's  2017 FEV1  1.3 40% DLCO 19.71  62%  Chronic Kidney Disease   Stage I     GFR >90  Stage II    GFR 60-89  Stage IIIA GFR 45-59  Stage IIIB GFR 30-44  Stage IV   GFR 15-29  Stage V    GFR  <15  Lab Results  Component Value Date   CREATININE 1.61 (H) 12/31/2018   Estimated Creatinine Clearance: 40.1 mL/min (A) (by C-G formula based on SCr of 1.61 mg/dL (H)).  PATH: Diagnosis Lung, biopsy, Left Upper - NON-SMALL CELL CARCINOMA,  SEE COMMENT. Microscopic Comment Immunohistochemistry for TTF-1 is positive. P63 and CK5/6 are negative. 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  Diagnosis FINE NEEDLE ASPIRATION ENDOSCOPIC (B) EBUS 7 NODE (SPECIMEN 2 OF 5, COLLECTED 12/18/2018): NO MALIGNANT CELLS IDENTIFIED. LYMPHOID TISSUE PRESENT.  Assessment / Plan:   1/Left upper lobe lung mass - radiographic evidence of clinical stage III lung cancer c (T1b,N2,M0)- I have reviewed the clinical diagnosis with the patient and his wife.  Bronch, EBUS and navigation bronchoscopy  Has been done to stage and get a tissue dx. findings reviewed in the multidisciplinary thoracic oncology conference and patient seen this afternoon by radiation and medical oncology.  On review of the findings including the path, the most appropriate clinical staging appears cStage IIB vs poss  cStage IIIA   (t1b, n1/n2 ,mo) .  On review of the PET scan the consensus was an over read of N2 node positivity.  As discussed with the patient as has medical and radiation oncology the options of treatment.  Although the patient does have underlying COPD, he remains active without significant symptoms and would best be served with proceeding with surgical resection, surgical staging and consider chemotherapy postoperatively should he have positive N1 nodes.  The risks and expectations of surgery were discussed with the patient and his wife in detail.  The risk of death infection bleeding blood transfusion, prolonged air leak, need for chest tubes and the general time course and recovery have been discussed with the patient and his wife.  They are willing to proceed.  The patient has not been smoking for the past month.    The goals risks and alternatives of the planned surgical procedure Procedure(s): VIDEO ASSISTED THORACOSCOPY (VATS)/LUNG RESECTION (Left)  have been discussed with the patient in detail. The risks of the procedure including death, infection,  stroke, myocardial infarction, bleeding, blood transfusion have all been discussed specifically.  I have  quoted Judson Roch a 2 % of perioperative mortality and a complication rate as high as 30%. The patient's questions have been answered.Judson Roch is willing  to proceed with the planned procedure.  2/ Followed by pulmonary for underlying COPD   3/ chronic kidney disease Stage iiiB     Grace Isaac MD      [address] [place] [address] Office [phone]   [place] [phone]  01/01/2019 9:18 AM

## 2019-01-01 NOTE — Anesthesia Procedure Notes (Signed)
Arterial Line Insertion Start/End1/27/2020 8:20 AM, 12/31/2018 8:35 AM Performed by: Kyung Rudd, CRNA, CRNA  Patient location: Pre-op. Preanesthetic checklist: patient identified, IV checked, site marked, risks and benefits discussed, surgical consent, monitors and equipment checked, pre-op evaluation and timeout performed Lidocaine 1% used for infiltration Right, radial was placed Catheter size: 20 G Hand hygiene performed , maximum sterile barriers used  and Seldinger technique used Allen's test indicative of satisfactory collateral circulation Attempts: 1 Procedure performed without using ultrasound guided technique. Following insertion, Biopatch and dressing applied. Post procedure assessment: normal  Patient tolerated the procedure well with no immediate complications.

## 2019-01-01 NOTE — Anesthesia Preprocedure Evaluation (Addendum)
Anesthesia Evaluation  Patient identified by MRN, date of birth, ID band Patient awake    Reviewed: Allergy & Precautions, NPO status , Patient's Chart, lab work & pertinent test results  Airway Mallampati: II  TM Distance: >3 FB Neck ROM: Full    Dental  (+) Dental Advisory Given, Edentulous Upper, Edentulous Lower   Pulmonary COPD,  COPD inhaler, former smoker,  Left lung mass    Pulmonary exam normal breath sounds clear to auscultation       Cardiovascular hypertension, Pt. on medications Normal cardiovascular exam Rhythm:Regular Rate:Normal     Neuro/Psych PSYCHIATRIC DISORDERS Anxiety negative neurological ROS     GI/Hepatic Neg liver ROS, PUD, GERD  Medicated,  Endo/Other  negative endocrine ROS  Renal/GU Renal InsufficiencyRenal disease     Musculoskeletal negative musculoskeletal ROS (+)   Abdominal   Peds  Hematology  (+) Blood dyscrasia (Thrombocytopenia), ,   Anesthesia Other Findings Day of surgery medications reviewed with the patient.  Reproductive/Obstetrics                            Anesthesia Physical Anesthesia Plan  ASA: III  Anesthesia Plan: General   Post-op Pain Management:    Induction: Intravenous  PONV Risk Score and Plan: 3 and Dexamethasone and Ondansetron  Airway Management Planned: Double Lumen EBT  Additional Equipment: Arterial line, CVP and Ultrasound Guidance Line Placement  Intra-op Plan:   Post-operative Plan: Extubation in OR  Informed Consent: I have reviewed the patients History and Physical, chart, labs and discussed the procedure including the risks, benefits and alternatives for the proposed anesthesia with the patient or authorized representative who has indicated his/her understanding and acceptance.     Dental advisory given  Plan Discussed with: CRNA  Anesthesia Plan Comments:         Anesthesia Quick Evaluation

## 2019-01-01 NOTE — Anesthesia Procedure Notes (Signed)
Procedure Name: Intubation Date/Time: 01/01/2019 10:05 AM Performed by: Bryson Corona, CRNA Pre-anesthesia Checklist: Patient identified, Emergency Drugs available, Suction available and Patient being monitored Patient Re-evaluated:Patient Re-evaluated prior to induction Oxygen Delivery Method: Circle System Utilized Preoxygenation: Pre-oxygenation with 100% oxygen Induction Type: IV induction Ventilation: Mask ventilation without difficulty Laryngoscope Size: Mac and 4 Grade View: Grade I Tube type: Oral Endobronchial tube: Left, Double lumen EBT and EBT position confirmed by fiberoptic bronchoscope and 39 Fr Number of attempts: 1 Airway Equipment and Method: Stylet and Oral airway Placement Confirmation: ETT inserted through vocal cords under direct vision,  positive ETCO2 and breath sounds checked- equal and bilateral Tube secured with: Tape Dental Injury: Teeth and Oropharynx as per pre-operative assessment  Comments: Reconfirmed after positioning

## 2019-01-01 NOTE — Anesthesia Procedure Notes (Signed)
Central Venous Catheter Insertion Performed by: Catalina Gravel, MD, anesthesiologist Start/End1/28/2020 9:13 AM, 01/01/2019 9:23 AM Patient location: Pre-op. Preanesthetic checklist: patient identified, IV checked, site marked, risks and benefits discussed, surgical consent, monitors and equipment checked, pre-op evaluation, timeout performed and anesthesia consent Position: Trendelenburg Lidocaine 1% used for infiltration and patient sedated Hand hygiene performed , maximum sterile barriers used  and Seldinger technique used Catheter size: 8 Fr Total catheter length 16. Central line was placed.Double lumen Procedure performed using ultrasound guided technique. Ultrasound Notes:anatomy identified, needle tip was noted to be adjacent to the nerve/plexus identified, no ultrasound evidence of intravascular and/or intraneural injection and image(s) printed for medical record Attempts: 1 Following insertion, dressing applied, line sutured and Biopatch. Post procedure assessment: blood return through all ports, free fluid flow and no air  Patient tolerated the procedure well with no immediate complications.

## 2019-01-01 NOTE — Transfer of Care (Signed)
Immediate Anesthesia Transfer of Care Note  Patient: Jack Taylor  Procedure(s) Performed: VIDEO ASSISTED THORACOSCOPY (VATS)/ LEFT LINGULECTOMY (Left Chest) LYMPH NODE DISSECTION (Left Chest) INTERCOSTAL NERVE BLOCK (Left Chest)  Patient Location: PACU  Anesthesia Type:General  Level of Consciousness: drowsy  Airway & Oxygen Therapy: Patient Spontanous Breathing and Patient connected to face mask oxygen  Post-op Assessment: Report given to RN and Post -op Vital signs reviewed and stable  Post vital signs: Reviewed and stable  Last Vitals:  Vitals Value Taken Time  BP 144/76   Temp 36.6 C 01/01/2019  2:15 PM  Pulse 56 01/01/2019  2:16 PM  Resp 18 01/01/2019  2:16 PM  SpO2 98 % 01/01/2019  2:16 PM  Vitals shown include unvalidated device data.  Last Pain:  Vitals:   01/01/19 0930  TempSrc:   PainSc: 0-No pain         Complications: No apparent anesthesia complications

## 2019-01-01 NOTE — Progress Notes (Signed)
CT surgery  Patient alert extubated and comfortable using PCI Minimal chest tube drainage after left VATS Continue current care

## 2019-01-01 NOTE — Brief Op Note (Addendum)
      [address]       [place] [address]             [phone]      01/01/2019  1:45 PM  PATIENT:  Jack Taylor  73 y.o. 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 Frozen section margin negative for cancer.  COUNTS CORRECT:  YES  DICTATION: .Dragon Dictation  PLAN OF CARE: Admit to inpatient   PATIENT DISPOSITION:  PACU - hemodynamically stable.   Delay start of Pharmacological VTE agent (>24hrs) due to surgical blood loss or risk of bleeding: yes

## 2019-01-01 NOTE — Anesthesia Postprocedure Evaluation (Signed)
Anesthesia Post Note  Patient: Jack Taylor  Procedure(s) Performed: VIDEO ASSISTED THORACOSCOPY (VATS)/ LEFT LINGULECTOMY (Left Chest) LYMPH NODE DISSECTION (Left Chest) INTERCOSTAL NERVE BLOCK (Left Chest)     Patient location during evaluation: PACU Anesthesia Type: General Level of consciousness: awake and alert Pain management: pain level controlled Vital Signs Assessment: post-procedure vital signs reviewed and stable Respiratory status: spontaneous breathing, nonlabored ventilation and respiratory function stable Cardiovascular status: blood pressure returned to baseline and stable Postop Assessment: no apparent nausea or vomiting Anesthetic complications: no    Last Vitals:  Vitals:   01/01/19 1417 01/01/19 1432  BP:  (!) 119/56  Pulse: 66 79  Resp: 17 17  Temp:    SpO2: 94% 95%    Last Pain:  Vitals:   01/01/19 1432  TempSrc:   PainSc: Asleep                 Catalina Gravel

## 2019-01-02 ENCOUNTER — Inpatient Hospital Stay (HOSPITAL_COMMUNITY): Payer: PPO

## 2019-01-02 ENCOUNTER — Other Ambulatory Visit (HOSPITAL_COMMUNITY): Payer: PPO

## 2019-01-02 ENCOUNTER — Encounter (HOSPITAL_COMMUNITY): Payer: Self-pay | Admitting: Cardiothoracic Surgery

## 2019-01-02 LAB — CBC
HCT: 38.3 % — ABNORMAL LOW (ref 39.0–52.0)
Hemoglobin: 13 g/dL (ref 13.0–17.0)
MCH: 32 pg (ref 26.0–34.0)
MCHC: 33.9 g/dL (ref 30.0–36.0)
MCV: 94.3 fL (ref 80.0–100.0)
Platelets: 143 10*3/uL — ABNORMAL LOW (ref 150–400)
RBC: 4.06 MIL/uL — ABNORMAL LOW (ref 4.22–5.81)
RDW: 12.5 % (ref 11.5–15.5)
WBC: 14.2 10*3/uL — ABNORMAL HIGH (ref 4.0–10.5)
nRBC: 0 % (ref 0.0–0.2)

## 2019-01-02 LAB — BASIC METABOLIC PANEL
Anion gap: 11 (ref 5–15)
BUN: 26 mg/dL — ABNORMAL HIGH (ref 8–23)
CO2: 22 mmol/L (ref 22–32)
Calcium: 8.5 mg/dL — ABNORMAL LOW (ref 8.9–10.3)
Chloride: 103 mmol/L (ref 98–111)
Creatinine, Ser: 1.88 mg/dL — ABNORMAL HIGH (ref 0.61–1.24)
GFR calc Af Amer: 40 mL/min — ABNORMAL LOW (ref >60)
GFR, EST NON AFRICAN AMERICAN: 35 mL/min — AB (ref >60)
Glucose, Bld: 132 mg/dL — ABNORMAL HIGH (ref 70–99)
Potassium: 5 mmol/L (ref 3.5–5.1)
Sodium: 136 mmol/L (ref 135–145)

## 2019-01-02 LAB — BLOOD GAS, ARTERIAL
Acid-base deficit: 1.2 mmol/L (ref 0.0–2.0)
Bicarbonate: 24.4 mmol/L (ref 20.0–28.0)
Drawn by: 441661
O2 Content: 4 L/min
O2 Saturation: 94.4 %
Patient temperature: 98.6
pCO2 arterial: 51.6 mmHg — ABNORMAL HIGH (ref 32.0–48.0)
pH, Arterial: 7.296 — ABNORMAL LOW (ref 7.350–7.450)
pO2, Arterial: 80.2 mmHg — ABNORMAL LOW (ref 83.0–108.0)

## 2019-01-02 LAB — TROPONIN I
Troponin I: <0.03 ng/mL (ref <0.03)
Troponin I: <0.03 ng/mL (ref <0.03)
Troponin I: <0.03 ng/mL (ref <0.03)

## 2019-01-02 LAB — GLUCOSE, CAPILLARY
Glucose-Capillary: 119 mg/dL — ABNORMAL HIGH (ref 70–99)
Glucose-Capillary: 134 mg/dL — ABNORMAL HIGH (ref 70–99)

## 2019-01-02 MED ORDER — SODIUM CHLORIDE 0.9% FLUSH: 10.0000 mL | INTRAVENOUS | Status: DC | PRN

## 2019-01-02 MED ORDER — SODIUM CHLORIDE 0.9% FLUSH
10.0000 mL | Freq: Two times a day (BID) | INTRAVENOUS | Status: DC
  Administered 2019-01-02 – 2019-01-06 (×9): 10 mL

## 2019-01-02 MED ORDER — CHLORHEXIDINE GLUCONATE CLOTH 2 % EX PADS
6.0000 | MEDICATED_PAD | Freq: Every day | CUTANEOUS | Status: DC
  Administered 2019-01-02 – 2019-01-07 (×6): 6 via TOPICAL

## 2019-01-02 MED ORDER — ENOXAPARIN SODIUM 30 MG/0.3ML ~~LOC~~ SOLN
30.0000 mg | SUBCUTANEOUS | Status: DC
  Administered 2019-01-02 – 2019-01-07 (×6): 30 mg via SUBCUTANEOUS
  Filled 2019-01-02 (×6): qty 0.3

## 2019-01-02 NOTE — Plan of Care (Signed)
  Problem: Respiratory: Goal: Respiratory status will improve Outcome: Progressing   Problem: Pain Management: Goal: Pain level will decrease Outcome: Progressing

## 2019-01-02 NOTE — Plan of Care (Signed)
  Problem: Coping: Goal: Level of anxiety will decrease Outcome: Progressing   Problem: Education: Goal: Knowledge of the prescribed therapeutic regimen will improve Outcome: Progressing

## 2019-01-02 NOTE — Progress Notes (Signed)
      [address]       [place] [address]             [phone]      POD # 1 lingular segmentectomy  Resting comfortably  BP 127/65   Pulse (!) 56   Temp 97.9 F (36.6 C) (Oral)   Resp 14   Ht 5\' 8"  (1.727 m)   Wt 78.3 kg   SpO2 91%   BMI 26.24 kg/m   Intake/Output Summary (Last 24 hours) at 01/02/2019 1702 Last data filed at 01/02/2019 1700 Gross per 24 hour  Intake 2271.13 ml  Output 605 ml  Net 1666.13 ml    Continue current care  Shannia Jacuinde C. Roxan Hockey, MD [facility] [phone]

## 2019-01-02 NOTE — Progress Notes (Addendum)
[address]       [place] [address]             [phone]      1 Day Post-Op Procedure(s) (LRB): VIDEO ASSISTED THORACOSCOPY (VATS)/ LEFT LINGULECTOMY (Left) LYMPH NODE DISSECTION (Left) INTERCOSTAL NERVE BLOCK (Left) Subjective: C/o of chest soreness including anteriorly, says PCA helps some  Objective: Vital signs in last 24 hours: Temp:  [97.8 F (36.6 C)-98.3 F (36.8 C)] 98.2 F (36.8 C) (01/29 0700) Pulse Rate:  [50-79] 53 (01/29 0600) Cardiac Rhythm: Sinus bradycardia (01/29 0000) Resp:  [7-26] 13 (01/29 0600) BP: (109-156)/(53-76) 124/61 (01/29 0600) SpO2:  [90 %-98 %] 97 % (01/29 0600) Arterial Line BP: (77-148)/(54-82) 133/63 (01/29 0600) Weight:  [78 kg-78.3 kg] 78.3 kg (01/29 0600)  Hemodynamic parameters for last 24 hours:    Intake/Output from previous day: 01/28 0701 - 01/29 0700 In: 2262.6 [P.O.:90; I.V.:1892.5; IV Piggyback:200.1] Out: 1175 [Urine:655; Blood:250; Chest Tube:270] Intake/Output this shift: No intake/output data recorded.  General appearance: alert, cooperative and no distress Heart: regular rate and rhythm Lungs: min dim in bases, o/w clear Abdomen: mod distension, non tender, no BS Extremities: no edema or calf tendernes Wound: dressings CDI  Lab Results: Recent Labs    12/31/18 1307 01/02/19 0411  WBC 7.3 14.2*  HGB 13.3 13.0  HCT 39.3 38.3*  PLT 145* 143*   BMET:  Recent Labs    12/31/18 1307 01/02/19 0411  NA 138 136  K 3.7 5.0  CL 107 103  CO2 24 22  GLUCOSE 113* 132*  BUN 15 26*  CREATININE 1.61* 1.88*  CALCIUM 9.2 8.5*    PT/INR:  Recent Labs    12/31/18 1307  LABPROT 13.1  INR 1.00   ABG    Component Value Date/Time   PHART 7.424 12/31/2018 1307   HCO3 26.7 12/31/2018 1307   O2SAT 95.9 12/31/2018 1307   CBG (last 3)  Recent Labs    01/01/19 1734 01/02/19 0003 01/02/19 0619  GLUCAP 152* 134* 119*    Meds Scheduled Meds: . acetaminophen  1,000 mg Oral Q6H   Or  . acetaminophen (TYLENOL) oral liquid 160 mg/5 mL  1,000 mg Oral Q6H  . amLODipine  10 mg Oral Daily  . arformoterol  15 mcg Nebulization BID  . bisacodyl  10 mg Oral Daily  . Chlorhexidine Gluconate Cloth  6 each Topical Daily  . fentaNYL   Intravenous Q4H  . insulin aspart  0-24 Units Subcutaneous Q6H  . pantoprazole  40 mg Oral Daily  . rosuvastatin  10 mg Oral Daily  . senna-docusate  1 tablet Oral QHS  . sodium chloride flush  10-40 mL Intracatheter Q12H  . umeclidinium bromide  1 puff Inhalation Daily   Continuous Infusions: . sodium chloride 75 mL/hr at 01/02/19 0654   PRN Meds:.diphenhydrAMINE **OR** diphenhydrAMINE, levalbuterol, naloxone **AND** sodium chloride flush, ondansetron (ZOFRAN) IV, oxyCODONE, sodium chloride flush, traMADol  Xrays Dg Chest 2 View  Result Date: 12/31/2018 CLINICAL DATA:  Left lung mass.  Pre-op respiratory exam EXAM: CHEST - 2 VIEW COMPARISON:  12/17/2018 FINDINGS: The heart size and mediastinal contours are within normal limits. Aortic atherosclerosis. Subtle nodular opacity is again seen in the left midlung. Seed localizers are seen in the lingula, surrounding this nodule. Lungs are otherwise clear. No evidence of pleural effusion. The visualized skeletal structures are unremarkable. IMPRESSION: Stable subtle pulmonary nodule in lingula.  No acute findings. Electronically Signed   By: Jenny Reichmann  Kris Hartmann M.D.   On: 12/31/2018 19:07   Dg Chest Port 1 View  Result Date: 01/01/2019 CLINICAL DATA:  Follow-up pneumothorax, recent left VATS EXAM: PORTABLE CHEST 1 VIEW COMPARISON:  12/31/2018 FINDINGS: Cardiac shadow is within normal limits. Right jugular central line and 2 thoracostomy catheters on the left are noted. Postsurgical changes are seen consistent with the recent surgery. Two of the previously placed fiducial markers have been removed during the procedure. One marker remains. No pneumothorax is noted. No infiltrate is seen. IMPRESSION: Postoperative  change without acute pneumothorax. Tubes and lines as described. Electronically Signed   By: Inez Catalina M.D.   On: 01/01/2019 15:03    Assessment/Plan: S/P Procedure(s) (LRB): VIDEO ASSISTED THORACOSCOPY (VATS)/ LEFT LINGULECTOMY (Left) LYMPH NODE DISSECTION (Left) INTERCOSTAL NERVE BLOCK (Left)  1 mod chest discomfort including anteriorly, will obtain Trop I and ekg, sinus brady on EKG. Mult cardiac risk factors- o/w hemodyn stable 2 sats ok on 2 liters 3 CXR pretty clear , tiny air leak with cough 4 + leukocytosis, no fevers- monitor, prob inflammatory response 5 H/H ok 6 minor thrombocytopenia- monitor 7 increased renal insuff- no ARB restart  Yet, cont current IVF 8 prob ileus- slow with diet  LOS: 1 day    Jack Giovanni  PA-C 01/02/2019 Pager [phone]  mbaseline increased cr, 1.8 , uop overnight - 655 /24 hrs, 200 past 12 hours Keep folie to monitor  Continue iv, avoid k  D/c alin e I have seen and examined Judson Roch and agree with the above assessment  and plan.  Grace Isaac MD Beeper [phone] Office [phone] 01/02/2019 8:00 AM

## 2019-01-03 ENCOUNTER — Inpatient Hospital Stay (HOSPITAL_COMMUNITY): Payer: PPO

## 2019-01-03 LAB — CBC
HCT: 37.1 % — ABNORMAL LOW (ref 39.0–52.0)
Hemoglobin: 11.9 g/dL — ABNORMAL LOW (ref 13.0–17.0)
MCH: 31.2 pg (ref 26.0–34.0)
MCHC: 32.1 g/dL (ref 30.0–36.0)
MCV: 97.1 fL (ref 80.0–100.0)
Platelets: 130 10*3/uL — ABNORMAL LOW (ref 150–400)
RBC: 3.82 MIL/uL — ABNORMAL LOW (ref 4.22–5.81)
RDW: 12.7 % (ref 11.5–15.5)
WBC: 17.9 10*3/uL — ABNORMAL HIGH (ref 4.0–10.5)
nRBC: 0 % (ref 0.0–0.2)

## 2019-01-03 LAB — COMPREHENSIVE METABOLIC PANEL
ALT: 22 U/L (ref 0–44)
ANION GAP: 8 (ref 5–15)
AST: 33 U/L (ref 15–41)
Albumin: 3.5 g/dL (ref 3.5–5.0)
Alkaline Phosphatase: 44 U/L (ref 38–126)
BUN: 40 mg/dL — ABNORMAL HIGH (ref 8–23)
CO2: 25 mmol/L (ref 22–32)
Calcium: 8.1 mg/dL — ABNORMAL LOW (ref 8.9–10.3)
Chloride: 103 mmol/L (ref 98–111)
Creatinine, Ser: 2.15 mg/dL — ABNORMAL HIGH (ref 0.61–1.24)
GFR calc non Af Amer: 30 mL/min — ABNORMAL LOW (ref >60)
GFR, EST AFRICAN AMERICAN: 34 mL/min — AB (ref >60)
Glucose, Bld: 107 mg/dL — ABNORMAL HIGH (ref 70–99)
Potassium: 4.9 mmol/L (ref 3.5–5.1)
Sodium: 136 mmol/L (ref 135–145)
TOTAL PROTEIN: 6.6 g/dL (ref 6.5–8.1)
Total Bilirubin: 0.9 mg/dL (ref 0.3–1.2)

## 2019-01-03 MED ORDER — TAMSULOSIN HCL 0.4 MG PO CAPS
0.4000 mg | ORAL_CAPSULE | Freq: Every day | ORAL | Status: DC
  Administered 2019-01-03 – 2019-01-07 (×5): 0.4 mg via ORAL
  Filled 2019-01-03 (×5): qty 1

## 2019-01-03 NOTE — Progress Notes (Addendum)
[address]       [place] [address]             [phone]      2 Days Post-Op Procedure(s) (LRB): VIDEO ASSISTED THORACOSCOPY (VATS)/ LEFT LINGULECTOMY (Left) LYMPH NODE DISSECTION (Left) INTERCOSTAL NERVE BLOCK (Left) Subjective: Soreness, but improved  Objective: Vital signs in last 24 hours: Temp:  [97.8 F (36.6 C)-99.2 F (37.3 C)] 99.2 F (37.3 C) (01/30 0400) Pulse Rate:  [56-90] 82 (01/30 0700) Cardiac Rhythm: Normal sinus rhythm (01/30 0000) Resp:  [6-36] 12 (01/30 0700) BP: (114-147)/(61-74) 114/65 (01/30 0700) SpO2:  [87 %-96 %] 92 % (01/30 0700) Arterial Line BP: (143)/(135) 143/135 (01/29 0900)  Hemodynamic parameters for last 24 hours:    Intake/Output from previous day: 01/29 0701 - 01/30 0700 In: 2113.3 [P.O.:240; I.V.:1873.3] Out: 935 [Urine:735; Chest Tube:200] Intake/Output this shift: No intake/output data recorded.  General appearance: alert and cooperative Heart: regular rate and rhythm Lungs: coarse throughout Abdomen: benign Extremities: no edema Wound: incis healing well  Lab Results: Recent Labs    01/02/19 0411 01/03/19 0406  WBC 14.2* 17.9*  HGB 13.0 11.9*  HCT 38.3* 37.1*  PLT 143* 130*   BMET:  Recent Labs    01/02/19 0411 01/03/19 0406  NA 136 136  K 5.0 4.9  CL 103 103  CO2 22 25  GLUCOSE 132* 107*  BUN 26* 40*  CREATININE 1.88* 2.15*  CALCIUM 8.5* 8.1*    PT/INR:  Recent Labs    12/31/18 1307  LABPROT 13.1  INR 1.00   ABG    Component Value Date/Time   PHART 7.296 (L) 01/02/2019 0400   HCO3 24.4 01/02/2019 0400   ACIDBASEDEF 1.2 01/02/2019 0400   O2SAT 94.4 01/02/2019 0400   CBG (last 3)  Recent Labs    01/01/19 1734 01/02/19 0003 01/02/19 0619  GLUCAP 152* 134* 119*    Meds Scheduled Meds: . acetaminophen  1,000 mg Oral Q6H   Or  . acetaminophen (TYLENOL) oral liquid 160 mg/5 mL  1,000 mg Oral Q6H  . amLODipine  10 mg Oral Daily  . arformoterol  15 mcg  Nebulization BID  . bisacodyl  10 mg Oral Daily  . Chlorhexidine Gluconate Cloth  6 each Topical Daily  . enoxaparin (LOVENOX) injection  30 mg Subcutaneous Q24H  . fentaNYL   Intravenous Q4H  . pantoprazole  40 mg Oral Daily  . rosuvastatin  10 mg Oral Daily  . senna-docusate  1 tablet Oral QHS  . sodium chloride flush  10-40 mL Intracatheter Q12H  . umeclidinium bromide  1 puff Inhalation Daily   Continuous Infusions: . sodium chloride 75 mL/hr at 01/03/19 0700   PRN Meds:.diphenhydrAMINE **OR** diphenhydrAMINE, levalbuterol, naloxone **AND** sodium chloride flush, ondansetron (ZOFRAN) IV, oxyCODONE, sodium chloride flush, traMADol  Xrays Dg Chest Port 1 View  Result Date: 01/02/2019 CLINICAL DATA:  Follow-up chest tube EXAM: PORTABLE CHEST 1 VIEW COMPARISON:  01/01/2019 FINDINGS: Cardiac shadow is stable. Right jugular central line and 2 thoracostomy catheters are again noted and stable. No pneumothorax is seen. Postsurgical changes on the left are noted mild right basilar atelectasis is seen increased from the prior exam. IMPRESSION: Postsurgical changes with tubes and lines as described. No pneumothorax is seen. New right basilar atelectasis is noted. Electronically Signed   By: Inez Catalina M.D.   On: 01/02/2019 08:11   Dg Chest Port 1 View  Result Date: 01/01/2019 CLINICAL DATA:  Follow-up pneumothorax, recent  left VATS EXAM: PORTABLE CHEST 1 VIEW COMPARISON:  12/31/2018 FINDINGS: Cardiac shadow is within normal limits. Right jugular central line and 2 thoracostomy catheters on the left are noted. Postsurgical changes are seen consistent with the recent surgery. Two of the previously placed fiducial markers have been removed during the procedure. One marker remains. No pneumothorax is noted. No infiltrate is seen. IMPRESSION: Postoperative change without acute pneumothorax. Tubes and lines as described. Electronically Signed   By: Inez Catalina M.D.   On: 01/01/2019 15:03     Assessment/Plan: S/P Procedure(s) (LRB): VIDEO ASSISTED THORACOSCOPY (VATS)/ LEFT LINGULECTOMY (Left) LYMPH NODE DISSECTION (Left) INTERCOSTAL NERVE BLOCK (Left)  1 doing well overall 2 hemodyn stable in sinus rhythm 3 sats good on 5 liters, cont pulm toilet and wean as able 4 CT without air leak, 120 cc out yesterday, 120 out so far today, poss d/c one CT today. CXR appearance is pretty stable. 5 leukocytosis increased, no fevers. Monitor. H/H is stable. Platelet count down a little from 143K to 130- monitor.  Troponins neg , EKG unremarkable 6 creat conts to rise- avoid nephrotoxic drugs, cont IVF for now 7 routine pulm toilet/nebs 8 mobilize as able   LOS: 2 days    [name] PA-C 01/03/2019 Pager [phone]  Frequent urination at home , start flomax and d/c foley To 2c D/c anterior chest tube  Path pending I have seen and examined Jack Taylor and agree with the above assessment  and plan.  Grace Isaac MD Beeper [phone] Office [phone] 01/03/2019 8:37 AM

## 2019-01-03 NOTE — Progress Notes (Signed)
Report called to receiving RN on 2C. Pt transferred without complication.

## 2019-01-04 ENCOUNTER — Inpatient Hospital Stay (HOSPITAL_COMMUNITY): Payer: PPO

## 2019-01-04 LAB — BASIC METABOLIC PANEL
Anion gap: 7 (ref 5–15)
BUN: 31 mg/dL — ABNORMAL HIGH (ref 8–23)
CO2: 26 mmol/L (ref 22–32)
Calcium: 8.5 mg/dL — ABNORMAL LOW (ref 8.9–10.3)
Chloride: 107 mmol/L (ref 98–111)
Creatinine, Ser: 1.38 mg/dL — ABNORMAL HIGH (ref 0.61–1.24)
GFR calc Af Amer: 59 mL/min — ABNORMAL LOW (ref >60)
GFR calc non Af Amer: 51 mL/min — ABNORMAL LOW (ref >60)
Glucose, Bld: 106 mg/dL — ABNORMAL HIGH (ref 70–99)
Potassium: 4.5 mmol/L (ref 3.5–5.1)
Sodium: 140 mmol/L (ref 135–145)

## 2019-01-04 LAB — CBC
HCT: 33.5 % — ABNORMAL LOW (ref 39.0–52.0)
Hemoglobin: 11 g/dL — ABNORMAL LOW (ref 13.0–17.0)
MCH: 31.3 pg (ref 26.0–34.0)
MCHC: 32.8 g/dL (ref 30.0–36.0)
MCV: 95.2 fL (ref 80.0–100.0)
Platelets: 107 10*3/uL — ABNORMAL LOW (ref 150–400)
RBC: 3.52 MIL/uL — ABNORMAL LOW (ref 4.22–5.81)
RDW: 12.7 % (ref 11.5–15.5)
WBC: 10.6 10*3/uL — ABNORMAL HIGH (ref 4.0–10.5)
nRBC: 0 % (ref 0.0–0.2)

## 2019-01-04 MED ORDER — DM-GUAIFENESIN ER 30-600 MG PO TB12
1.0000 | ORAL_TABLET | Freq: Two times a day (BID) | ORAL | Status: DC
  Administered 2019-01-04 – 2019-01-07 (×7): 1 via ORAL
  Filled 2019-01-04 (×7): qty 1

## 2019-01-04 MED ORDER — LACTULOSE 10 GM/15ML PO SOLN: 30.0000 g | Freq: Every day | ORAL | Status: DC | PRN

## 2019-01-04 MED ORDER — ALUM & MAG HYDROXIDE-SIMETH 200-200-20 MG/5ML PO SUSP
20.0000 mL | ORAL | Status: DC | PRN
  Administered 2019-01-04: 20 mL via ORAL
  Filled 2019-01-04: qty 30

## 2019-01-04 MED ORDER — METOCLOPRAMIDE HCL 5 MG/ML IJ SOLN
10.0000 mg | Freq: Four times a day (QID) | INTRAMUSCULAR | Status: AC
  Administered 2019-01-04 – 2019-01-05 (×3): 10 mg via INTRAVENOUS
  Filled 2019-01-04 (×3): qty 2

## 2019-01-04 NOTE — Progress Notes (Addendum)
[name]       [place] [address]             [phone]      3 Days Post-Op Procedure(s) (LRB): VIDEO ASSISTED THORACOSCOPY (VATS)/ LEFT LINGULECTOMY (Left) LYMPH NODE DISSECTION (Left) INTERCOSTAL NERVE BLOCK (Left) Subjective: Had a "bad night" didn't sleep well, + productive cough  Objective: Vital signs in last 24 hours: Temp:  [97.9 F (36.6 C)-98.8 F (37.1 C)] 98.2 F (36.8 C) (01/31 0713) Pulse Rate:  [77-94] 94 (01/31 0713) Cardiac Rhythm: Normal sinus rhythm (01/31 0400) Resp:  [6-22] 17 (01/31 0733) BP: (110-143)/(55-96) 143/69 (01/31 0713) SpO2:  [90 %-96 %] 94 % (01/31 0713)  Hemodynamic parameters for last 24 hours:    Intake/Output from previous day: 01/30 0701 - 01/31 0700 In: 961.9 [P.O.:590; I.V.:371.9] Out: 1460 [Urine:1370; Chest Tube:40] Intake/Output this shift: Total I/O In: 0  Out: 150 [Urine:150]  General appearance: alert, cooperative and no distress Heart: regular rate and rhythm and tachy Lungs: coarse ronchi throughout Abdomen: + distension, + BS, nontender Extremities: no edema or calf tenderness Wound: incis healing well  Lab Results: Recent Labs    01/03/19 0406 01/04/19 0316  WBC 17.9* 10.6*  HGB 11.9* 11.0*  HCT 37.1* 33.5*  PLT 130* 107*   BMET:  Recent Labs    01/03/19 0406 01/04/19 0316  NA 136 140  K 4.9 4.5  CL 103 107  CO2 25 26  GLUCOSE 107* 106*  BUN 40* 31*  CREATININE 2.15* 1.38*  CALCIUM 8.1* 8.5*    PT/INR: No results for input(s): LABPROT, INR in the last 72 hours. ABG    Component Value Date/Time   PHART 7.296 (L) 01/02/2019 0400   HCO3 24.4 01/02/2019 0400   ACIDBASEDEF 1.2 01/02/2019 0400   O2SAT 94.4 01/02/2019 0400   CBG (last 3)  Recent Labs    01/01/19 1734 01/02/19 0003 01/02/19 0619  GLUCAP 152* 134* 119*    Meds Scheduled Meds: . acetaminophen  1,000 mg Oral Q6H   Or  . acetaminophen (TYLENOL) oral liquid 160 mg/5 mL  1,000 mg Oral Q6H  .  amLODipine  10 mg Oral Daily  . arformoterol  15 mcg Nebulization BID  . bisacodyl  10 mg Oral Daily  . Chlorhexidine Gluconate Cloth  6 each Topical Daily  . enoxaparin (LOVENOX) injection  30 mg Subcutaneous Q24H  . fentaNYL   Intravenous Q4H  . pantoprazole  40 mg Oral Daily  . rosuvastatin  10 mg Oral Daily  . senna-docusate  1 tablet Oral QHS  . sodium chloride flush  10-40 mL Intracatheter Q12H  . tamsulosin  0.4 mg Oral Daily  . umeclidinium bromide  1 puff Inhalation Daily   Continuous Infusions: . sodium chloride 50 mL/hr at 01/03/19 0858   PRN Meds:.diphenhydrAMINE **OR** diphenhydrAMINE, levalbuterol, naloxone **AND** sodium chloride flush, ondansetron (ZOFRAN) IV, oxyCODONE, sodium chloride flush, traMADol  Xrays Dg Chest Port 1 View  Result Date: 01/04/2019 CLINICAL DATA:  Chest tube placement. EXAM: PORTABLE CHEST 1 VIEW COMPARISON:  Radiograph of January 03, 2019. FINDINGS: Stable cardiomediastinal silhouette. Right internal jugular catheter has been removed. One left-sided chest tube has been removed. The other is unchanged in position. No pneumothorax is noted. Stable left midlung atelectasis or infiltrate is noted. Stable right basilar subsegmental atelectasis is noted. No pleural effusion is noted. Bony thorax is unremarkable. IMPRESSION: One left-sided chest tube is been removed. The other is stable in position. No  pneumothorax is noted. Stable bilateral lung opacities as described above. Electronically Signed   By: Marijo Conception, M.D.   On: 01/04/2019 07:43   Dg Chest Port 1 View  Result Date: 01/03/2019 CLINICAL DATA:  Follow-up chest tube placement EXAM: PORTABLE CHEST 1 VIEW COMPARISON:  01/02/2019 FINDINGS: Cardiac shadow is stable. Postsurgical changes are again seen on the left with 2 thoracostomy catheters in place. No pneumothorax is noted. Right jugular central line is again seen. Right basilar atelectasis is mildly improved when compared with the prior study.  IMPRESSION: Slight improvement in right basilar atelectasis. Stable left thoracostomy catheters without pneumothorax. Electronically Signed   By: Inez Catalina M.D.   On: 01/03/2019 08:31    Assessment/Plan: S/P Procedure(s) (LRB): VIDEO ASSISTED THORACOSCOPY (VATS)/ LEFT LINGULECTOMY (Left) LYMPH NODE DISSECTION (Left) INTERCOSTAL NERVE BLOCK (Left)  1 slow but steady progress 2 hemodyn stable, occ tachycardia, some htn- follow off ARB for now- renal insuff is improving trend, creat now 1.38 3 leukocytosis improving, no fevers, + clinical bronchitis with some stable lung opacities- monitor closely as may need abx 4 BS control ok, no DM meds preop 5 constipation- some abd distension  But active BS- add Lactulose 6 H/H dropped some further, will guiac stool 7 push pulm toilet as able- add mucinex and cont other RX 8 d/c chest tube 9 ambulate  LOS: 3 days    [name] [facility] 01/04/2019 Pager [phone]  D/c chest tube  D/c pca pump Monitor for urinary retention, check bladder scan Follow up bmet and follow known preop renal insufficiency Reviewed path with patient and wife  Cancer Staging Adenocarcinoma of left lung, stage 3 ([place]) Staging form: Lung, AJCC 8th Edition - Clinical: Stage IIIA (cT1b, cN2, cM0) - Signed by Curt Bears, MD on 12/27/2018 - Pathologic stage from 01/03/2019: Stage IA3 (pT1c, pN0, cM0) - Signed by Grace Isaac, MD on 01/03/2019   I have seen and examined Jack Taylor and agree with the above assessment  and plan.  Grace Isaac MD Beeper [phone] Office [phone] 01/04/2019 8:32 AM

## 2019-01-04 NOTE — Care Management Important Message (Signed)
Important Message  Patient Details  Name: Jack Taylor MRN: [id number] Date of Birth: 1946-05-03   Medicare Important Message Given:  Yes    Barb Merino Avagail Whittlesey 01/04/2019, 2:52 PM

## 2019-01-04 NOTE — Discharge Instructions (Signed)
Thoracoscopy, Care After  This sheet gives you information about how to care for yourself after your procedure. Your health care provider may also give you more specific instructions. If you have problems or questions, contact your health care provider.  What can I expect after the procedure?  After the procedure, it is common to have pain and soreness in the surgical area.  Follow these instructions at home:  Incision care     Follow instructions from your health care provider about how to take care of your incision. Make sure you:  ? Wash your hands with soap and water before you change your bandage (dressing). If soap and water are not available, use hand sanitizer.  ? Change your dressing as told by your health care provider.  ? Leave stitches (sutures), skin glue, or adhesive strips in place. These skin closures may need to stay in place for 2 weeks or longer. If adhesive strip edges start to loosen and curl up, you may trim the loose edges. Do not remove adhesive strips completely unless your health care provider tells you to do that.   Check your incision areas every day for signs of infection. Check for:  ? Redness, swelling, or pain.  ? Fluid or blood.  ? Warmth.  ? Pus or a bad smell.   Do not take baths, swim, or use a hot tub until your health care provider approves. You may take showers.  Medicines   Take over-the-counter and prescription medicines only as told by your health care provider.   If you were prescribed an antibiotic medicine, take it as told by your health care provider. Do not stop taking the antibiotic even if you start to feel better.   Do not drive or use heavy machinery while taking prescription pain medicine.   If you are taking prescription pain medicine, take actions to prevent or treat constipation. Your health care provider may recommend that you:  ? Drink enough fluid to keep your urine pale yellow.  ? Eat foods that are high in fiber, such as fresh fruits and vegetables,  whole grains, and beans.  ? Limit foods that are high in fat and processed sugars, such as fried and sweet foods.  ? Take an over-the-counter or prescription medicine for constipation.  Managing pain, stiffness, and swelling     If directed, put ice on the affected area:  ? Put ice in a plastic bag.  ? Place a towel between your skin and the bag.  ? Leave the ice on for 20 minutes, 2-3 times a day.  Preventing lung infection   To prevent pneumonia and to keep your lungs healthy:  ? Try to cough often. If it hurts to cough, hold a pillow against your chest as you cough.  ? Take deep breaths or do breathing exercises as instructed by your health care provider.  ? If you were given an incentive spirometer, use it as directed by your health care provider.  General instructions   Do not lift anything that is heavier than 10 lb (4.5 kg), or the limit that you are told, until your health care provider says that it is safe.   Do not use any products that contain nicotine or tobacco, such as cigarettes and e-cigarettes. These can delay healing after surgery. If you need help quitting, ask your health care provider.   Avoid driving until your health care provider approves.   If you have a chest drainage tube, care for it   as instructed by your health care provider. Do not travel by airplane after the chest drainage tube is removed until your health care provider approves.   Keep all follow-up visits as told by your health care provider. This is important.  Contact a health care provider if:   You have a fever.   Pain medicines do not ease your pain.   You have redness, swelling, or increasing pain in your incision area.   You develop a cough that does not go away, or you are coughing up mucus that is yellow or green.  Get help right away if:   You have fluid, blood, or pus coming from your incision.   There is a bad smell coming from your incision or dressing.   You develop a rash.   You cough up blood.   You  develop light-headedness, or you feel faint.   You have difficulty breathing.   You develop chest pain.   Your heartbeat feels irregular or very fast.  These symptoms may represent a serious problem that is an emergency. Do not wait to see if the symptoms will go away. Get medical help right away. Call your local emergency services (911 in the [place] Do not drive yourself to the hospital.  Summary   Follow instructions from your health care provider about how to take care of your incision.   Do not drive or use heavy machinery while taking prescription pain medicine.   Leave stitches (sutures), skin glue, or adhesive strips in place.   Check your incision areas every day for signs of infection.  This information is not intended to replace advice given to you by your health care provider. Make sure you discuss any questions you have with your health care provider.  Document Released: 06/10/2005 Document Revised: 10/31/2017 Document Reviewed: 10/31/2017  Elsevier Interactive Patient Education  2019 [employer]

## 2019-01-04 NOTE — Progress Notes (Signed)
Fentanyl 3 mls wasted in stericycle. , Morghan Rn witnessed same.

## 2019-01-04 NOTE — Discharge Summary (Signed)
Physician Discharge Summary  Patient ID: Jack Taylor MRN: [id number] DOB/AGE: 02/05/1946 73 y.o.  Admit date: 01/01/2019 Discharge date: 01/07/2019  Admission Diagnoses: Left lung mass  Discharge Diagnoses:  Active Problems:   Lung mass   Pressure injury of skin  Patient Active Problem List   Diagnosis Date Noted  . Lung mass 01/01/2019  . Pressure injury of skin 01/01/2019  . Adenocarcinoma of left lung, stage 3 ([place]) 12/27/2018  . Goals of care, counseling/discussion 12/27/2018  . COPD (chronic obstructive pulmonary disease) ([place]) 04/20/2016  . Tobacco use disorder 02/16/2016  . Duodenal ulcer 11/24/2011  . Anemia associated with acute blood loss 11/24/2011  . Hypertension 11/22/2011   History of the present illness:  The patient was recently sent for cardiothoracic surgical evaluation due to a left lung nodule in the upper lobe.  He is a 73 year old male with a past medical history significant for COPD, hypertension, dyslipidemia, GERD, colon polyps as well as gastric ulcer.  He additionally has a long history of tobacco abuse.  The patient was recently evaluated by his primary care physician for annual physical exam and was noted to have significant wheezing.  Subsequent evaluation included CT screening and this revealed a 1.51 cm left upper lobe pulmonary nodule.  A follow-up PET scan revealed this to be a spiculated nodule and was noted to be significantly hypermetabolic with an SUV of 7.6.  He later underwent navigation bronchoscopy and biopsy.  Findings were consistent with non-small carcinoma.  It was felt that he would benefit from further resection and was admitted this hospitalization for the procedure.   Discharged Condition: good  Hospital Course: The patient was admitted electively and on 01/01/2019 he was taken to the operating room where he underwent the below described procedure.  He tolerated it well and was taken to the postanesthesia care unit in stable  condition.  Postoperative hospital course:  Pathology has revealed invasive moderately differentiated adenocarcinoma, 1.3 cm, acinar predominant.  Please see the full pathology report described below.  The patient has progressed in a steady manner.  He has remained hemodynamically stable.  He does have some chronic renal insufficiency and in the postoperative period developed acute worsening with a peak creatinine of 2.15.  This is showing improvement over time and most recent creatinine at the time of discharge is 1.15.  He is ARB has been restarted for postoperative hypertension at its previous dose.  Additionally he does have an expected acute blood loss anemia which we have been following clinically.  It is stable with HCT 32.  M He is making steady progress in his activity level.  Oxygen has been weaned and he maintains good saturations on room air.  Incisions are noted to be healing well without evidence of infection.  He is tolerating routine activities using standard protocols.  At the time of discharge the patient is felt to be quite stable.  Consults: None  Significant Diagnostic Studies: Routine postoperative serial chest x-rays and labs.  Treatments: surgery:          01/01/2019  1:45 PM  PATIENT:  Jack Taylor  73 y.o. male  PRE-OPERATIVE DIAGNOSIS:  Left non small cell lung cancer (T1b,n1 vs N2,M0)  POST-OPERATIVE DIAGNOSIS:  Left non small cell lung cancer   PROCEDURE:  LEFT VIDEO ASSISTED THORACOSCOPY (VATS),  LEFT LINGULECTOMY, LYMPH NODE DISSECTION, and INTERCOSTAL NERVE BLOCK  SURGEON:  Surgeon(s) and Role:    Grace Isaac, MD - Primary  PHYSICIAN ASSISTANT: Lars Pinks  PA-C  ANESTHESIA:   general  EBL:  150 ml   Pathology:  FINAL DIAGNOSIS Diagnosis 1. Lung, resection (segmental or lobe), Left lingua - INVASIVE MODERATELY DIFFERENTIATED ADENOCARCINOMA, 1.3 CM, ACINAR PREDOMINANT. - VISCERAL PLEURA IS NOT INVOLVED. - NEGATIVE FOR  LYMPHOVASCULAR OR PERINEURAL INVASION. - RESECTION MARGINS ARE NEGATIVE FOR CARCINOMA. - SEE ONCOLOGY TABLE. 2. Lymph node, biopsy, 11L #2 - FRAGMENT OF BENIGN LUNG PARENCHYMA, NEGATIVE FOR CARCINOMA. - LYMPHOID TISSUE IS NOT IDENTIFIED. 3. Lymph node, biopsy, 10L - LYMPH NODE, NEGATIVE FOR CARCINOMA (0/1). 4. Lymph node, biopsy, 10L #2 - LYMPH NODE, NEGATIVE FOR CARCINOMA (0/1). 5. Lymph node, biopsy, 11L - LYMPH NODE, NEGATIVE FOR CARCINOMA (0/1). 6. Lymph node, biopsy, 11L #3 - LYMPH NODE, NEGATIVE FOR CARCINOMA (0/1). 7. Lymph node, biopsy, 10L #3 - LYMPH NODE, NEGATIVE FOR CARCINOMA (0/1). 8. Lymph node, biopsy, 12L - LYMPH NODE, NEGATIVE FOR CARCINOMA (0/1). 9. Lymph node, biopsy, 4L - LYMPH NODE, NEGATIVE FOR CARCINOMA (0/1). Microscopic Comment 1. LUNG: Procedure: Segmental resection of lung. Specimen Laterality: Left. Tumor Site: Left lingula. 1 of 3 FINAL for Jack Taylor, Jack Taylor [phone]) Microscopic Comment(continued) Tumor Size: 1.3 cm. Tumor Focality: Unifocal. Histologic Type: Adenocarcinoma, acinar predominant. Visceral Pleura Invasion: Not identified. Lymphovascular Invasion: Not identified. Direct Invasion of Adjacent Structures: Not applicable. Margins: Negative for carcinoma. Treatment Effect: Not applicable. Regional Lymph Nodes: Number of Lymph Nodes Involved: 0. Number of Lymph Nodes Examined: 7. Pathologic Stage Classification (pTNM, AJCC 8th Edition): pT1b, pN0. Ancillary Studies: Can be performed at clinician's request. Representative Tumor Block: 1C. (v4.0.0.3) Jaquita Folds MD Pathologist, Electronic Signature (Case signed 01/03/2019) Intraoperative Diagnosis 1. LEFT LUNG LINGULA: A - MARGIN: THE MARGIN IS NEGATIVE FOR NEOPLASM (AC). B - MASS: NON-SMALL CELL NEOPLASM (AC) Specimen Gross and Clinical Information Specimen(s) Obtained: 1. Lung, resection (segmental or lobe), Left lingua 2. Lymph node, biopsy, 11L #2 3. Lymph node, biopsy,  10L 4. Lymph node, biopsy, 10L #2 5. Lymph node, biopsy, 11L 6. Lymph node, biopsy, 11L #3 7. Lymph node, biopsy, 10L #3 8. Lymph node, biopsy, 12L 9. Lymph node, biopsy, 4L Specimen Clinical Information 1. Left lung mass (ag) Gross 1. Received fresh for intraoperative consult is a 12 x 9 x 3 cm portion of spongy blue gray lung parenchyma, 79 grams at gross. The specimen is received with a suture designating the mass. The pleural surface is smooth, glistening. Upon sectioning there is a severely ill defined 1.3 x 1.2 x 1 cm tan pink mass, 1.5 cm to the pleural surface and 2.2 cm to the closest stapled margin. A representative section of the mass and margin are submitted for frozen section and 2 of 3 FINAL for Jack Taylor, Jack Taylor ([id number]) Gross(continued) subsequently submitted in block 1A. The remaining cut surfaces are spongy, red brown, focally firm. Discrete additional nodules are not grossly seen. The specimen is representatively submitted as follows: A = closest margin en face, frozen section remnant. B = mass, frozen section remnant C = remaining mass D-E = uninvolved parenchyma. 2. Received in formalin is a 0.6 cm anthracotic lymph node, submitted in block 2A. 3. Received in formalin are two anthracotic lymph node fragments ranging from 0.4 to 0.5 cm, submitted in block 3A. 4. Received fresh is a 1.1 x 0.6 x 0.2 cm aggregate of anthracotic lymph node fragments, submitted in block 4A. 5. Received in saline is a 0.5 cm anthracotic lymph node fragment, submitted in one block. 6. Received in saline is a 0.5 cm anthracotic lymph node  fragment, submitted in block 6A. 7. Received in saline is a 0.7 cm anthracotic lymph node fragment, submitted in block 7A. 8. Received in saline is a 0.6 x 0.3 x 0.2 cm aggregate of anthracotic lymph node fragments, submitted in block 8A. 9. Received in saline is a 1 x 0.2 cm aggregate of anthracotic lymph node fragments, submitted in block 9A.  (AK:kh 01/02/2019) Report signed out from the following location(s) Technical component and interpretation was performed at [facility], [place], [place] [address]. CLIA #: [id number], 3 of  Discharge Exam: Blood pressure (!) 149/75, pulse 88, temperature 98.2 F (36.8 C), temperature source Oral, resp. rate 15, height 5\' 8"  (1.727 m), weight 78.3 kg, SpO2 96 %.  General appearance: alert, cooperative and no distress Heart: regular rate and rhythm Lungs: minor ronchi in upper airway, significantly improved from previous exams last week Abdomen: benign Extremities: no edema or calf tenderness Wound: incis healing well  Disposition: Discharge disposition: 01-Home or Self Care       Discharge Instructions    Discharge patient   Complete by:  As directed    Discharge disposition:  01-Home or Self Care   Discharge patient date:  01/07/2019     Allergies as of 01/07/2019   No Known Allergies     Medication List    TAKE these medications   albuterol 108 (90 Base) MCG/ACT inhaler Commonly known as:  PROVENTIL HFA;VENTOLIN HFA Inhale 2 puffs into the lungs every 6 (six) hours as needed for wheezing or shortness of breath. Reported on 06/02/2016   amLODipine 10 MG tablet Commonly known as:  NORVASC Take 10 mg by mouth daily.   esomeprazole 40 MG capsule Commonly known as:  NEXIUM Take 40 mg by mouth daily.   losartan 50 MG tablet Commonly known as:  COZAAR Take 50 mg by mouth daily.   rosuvastatin 10 MG tablet Commonly known as:  CRESTOR Take 10 mg by mouth daily.   tamsulosin 0.4 MG Caps capsule Commonly known as:  FLOMAX Take 1 capsule (0.4 mg total) by mouth daily.   Tiotropium Bromide-Olodaterol 2.5-2.5 MCG/ACT Aers Inhale 1 puff into the lungs daily.   traMADol 50 MG tablet Commonly known as:  ULTRAM Take 1 tablet (50 mg total) by mouth every 6 (six) hours as needed for up to 7 days (mild pain).      Follow-up Information     Grace Isaac, MD Follow up.   Specialty:  Cardiothoracic Surgery Why:  Please see discharge paperwork for follow-up appointment for suture removal as well as to see Dr. Servando Snare.  Please obtain a chest x-ray at [facility] 1/2-hour prior to this appointment.  It is located in the same office complex.  Contact information: [address] [place] [address] [phone]           Signed: [name] PA-C 01/07/2019, 8:26 AM

## 2019-01-05 ENCOUNTER — Inpatient Hospital Stay (HOSPITAL_COMMUNITY): Payer: PPO

## 2019-01-05 LAB — BASIC METABOLIC PANEL
Anion gap: 9 (ref 5–15)
BUN: 22 mg/dL (ref 8–23)
CO2: 26 mmol/L (ref 22–32)
Calcium: 8.4 mg/dL — ABNORMAL LOW (ref 8.9–10.3)
Chloride: 105 mmol/L (ref 98–111)
Creatinine, Ser: 1.24 mg/dL (ref 0.61–1.24)
GFR calc Af Amer: >60 mL/min (ref >60)
GFR calc non Af Amer: 58 mL/min — ABNORMAL LOW (ref >60)
Glucose, Bld: 109 mg/dL — ABNORMAL HIGH (ref 70–99)
Potassium: 3.7 mmol/L (ref 3.5–5.1)
Sodium: 140 mmol/L (ref 135–145)

## 2019-01-05 LAB — CBC
HCT: 30 % — ABNORMAL LOW (ref 39.0–52.0)
Hemoglobin: 10.5 g/dL — ABNORMAL LOW (ref 13.0–17.0)
MCH: 32.3 pg (ref 26.0–34.0)
MCHC: 35 g/dL (ref 30.0–36.0)
MCV: 92.3 fL (ref 80.0–100.0)
Platelets: 102 10*3/uL — ABNORMAL LOW (ref 150–400)
RBC: 3.25 MIL/uL — ABNORMAL LOW (ref 4.22–5.81)
RDW: 12.2 % (ref 11.5–15.5)
WBC: 6.5 10*3/uL (ref 4.0–10.5)
nRBC: 0 % (ref 0.0–0.2)

## 2019-01-05 NOTE — Progress Notes (Addendum)
4 Days Post-Op Procedure(s) (LRB): VIDEO ASSISTED THORACOSCOPY (VATS)/ LEFT LINGULECTOMY (Left) LYMPH NODE DISSECTION (Left) INTERCOSTAL NERVE BLOCK (Left) Subjective: Resting quietly in bed with a family member at the bedside.  Still having some soreness and mild shortness of breath as expected.  He walked in the hall with assistance yesterday.  He had bowel movements x2 yesterday.  Objective: Vital signs in last 24 hours: Temp:  [97.8 F (36.6 C)-98.7 F (37.1 C)] 98.2 F (36.8 C) (02/01 0720) Pulse Rate:  [78-92] 84 (02/01 0720) Cardiac Rhythm: Normal sinus rhythm (02/01 0700) Resp:  [12-17] 16 (02/01 0720) BP: (121-134)/(60-71) 124/70 (02/01 0720) SpO2:  [90 %-100 %] 100 % (02/01 0720)    Intake/Output from previous day: 01/31 0701 - 02/01 0700 In: 400 [P.O.:400] Out: 1400 [Urine:1400] Intake/Output this shift: No intake/output data recorded.  General appearance: alert, cooperative and no distress Heart: regular rate and rhythm Lungs: clear to auscultation bilaterally Abdomen: soft, non-tender; bowel sounds normal; no masses,  no organomegaly Wound: The left thoracotomy incision is well approximated and dry.  Chest tube removal site is covered with a clean, intact dressing.  Lab Results: Recent Labs    01/04/19 0316 01/05/19 0307  WBC 10.6* 6.5  HGB 11.0* 10.5*  HCT 33.5* 30.0*  PLT 107* 102*   BMET:  Recent Labs    01/04/19 0316 01/05/19 0307  NA 140 140  K 4.5 3.7  CL 107 105  CO2 26 26  GLUCOSE 106* 109*  BUN 31* 22  CREATININE 1.38* 1.24  CALCIUM 8.5* 8.4*    PT/INR: No results for input(s): LABPROT, INR in the last 72 hours. ABG    Component Value Date/Time   PHART 7.296 (L) 01/02/2019 0400   HCO3 24.4 01/02/2019 0400   ACIDBASEDEF 1.2 01/02/2019 0400   O2SAT 94.4 01/02/2019 0400   CBG (last 3)  No results for input(s): GLUCAP in the last 72 hours.  Assessment/Plan: S/P Procedure(s) (LRB): VIDEO ASSISTED THORACOSCOPY (VATS)/ LEFT  LINGULECTOMY (Left) LYMPH NODE DISSECTION (Left) INTERCOSTAL NERVE BLOCK (Left)  1.  Postop day 4 left VATS, lingulectomy, lymph node sampling for a clinical stage IIIa invasive moderately differentiated adenocarcinoma.  Pathology findings have been discussed with the patient by Dr. Pia Mau.  Respiratory status is stable.  Chest tube removed on 01/04/2019.  Follow-up chest x-ray satisfactory with a tiny left apical pneumothorax.  He is maintaining adequate oxygen saturation on 2 L oxygen by nasal cannula.  Continue working on mobility and pulmonary hygiene. 2.  Postoperative urinary retention-resolved Postoperative leukocytosis-resolved, no clinical evidence of infection 3.  Expected acute blood loss anemia.  Slight drop in hematocrit over the past 24 hours.  No evidence of active bleeding.  Continue to monitor. 4.  DVT prophylaxis-continue enoxaparin daily 5.  History of hypertension-control is appropriate   LOS: 4 days    Antony Odea, PA-C [phone] 01/05/2019   I have seen and examined the patient and agree with the assessment and plan as outlined.  Rexene Alberts, MD 01/05/2019 11:59 AM

## 2019-01-05 NOTE — Op Note (Signed)
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  SURGICAL PROCEDURE:  Left video-assisted thoracoscopy, left lingulectomy, lymph node dissection, and intercostal nerve block.  SURGEON:  Lanelle Bal, MD  FIRST ASSISTANT:  Lars Pinks, PA  BRIEF HISTORY:  The patient is a 73 year old male who had previously undergone evaluation for a left lung nodule.  This was found incidentally on CT scan of the chest.  PET scan showed less than a 2 cm mass in the left upper lobe but raised the issue of  possible N2 and N1 nodal disease.  Previously, the patient had undergone bronchoscopy with navigation bronchoscopy and biopsy of the left upper lobe lesion which was confirmed to be adenocarcinoma.  Biopsy of the mediastinal nodes with EBUS was negative.   The patient has been a long-term smoker and had some limitation of his pulmonary function studies including FEV1 of 1.35 up to 1.69 with bronchodilators 55% predicted and a diffusion capacity of 54% of predicted.  Functionally, the patient is very  active, doing yard work, working around his house without significant pulmonary limitations.  After review of his pathologic findings and radiographic findings at the [facility], the consensus was to proceed with surgical  resection, reserving chemotherapy if he is found pathologically to have positive nodes or more advanced disease.  This was discussed with the patient and his wife, and he was agreeable with proceeding.  DESCRIPTION OF PROCEDURE:  The patient with central line and arterial line in place underwent general endotracheal anesthesia without  incident.  A double-lumen endotracheal tube was placed.  The patient was then turned in lateral decubitus position with  the left side up.  Left chest was prepped with Betadine, draped in the usual sterile manner.  The left chest had preoperatively been marked.  A timeout was performed confirming laterality.  We then proceeded with making a small port incision  approximately mid axillary line, 7th intercostal space.  We entered the chest.  There were a few adhesions, but the lung was poorly deflated.  We worked with Anesthesia to deflate the left lung as much as possible.  A small utilitarian incision was made  approximately the 4th intercostal space.  Through this and with the scope, we were able to palpate the nodule, which was in the lingula.  The patient had a well-developed major fissure.  With the location and with the patient's somewhat limited pulmonary  reserve, we decided to proceed with lingular segmentectomy.  We dissected along the fissure and identified the pulmonary artery branch to the lingula.  This was divided.  We then moved anteriorly and dissected along the upper pulmonary vein, identifying  the branches of the pulmonary vein from the lingula.  These were encircled and stapled with a vascular stapler.  As we dissected along the fissure, we were able to identify the lingular bronchus.  With this dissected free, we could encircle it with a  green load Endo stapler.  The stapler was placed across the bronchus.  The lung was inflated.  With inflation of the lower lobe and the remainder of the upper lobe, the lingula stayed collapsed.  We then divided the bronchus and then completed the  resection with the stapler across the lung until resection  was completed.  The specimen was placed in a specimen bag and brought out through the incision.  We then proceeded to dissect out nodes including 10L, 11L, 12L and moved to the mediastinum,  dissected out 4L nodes, each labeled independently.  Progel  was placed along the cut edge of the bronchus and lung.  The lung was tested for any air leaks and was without any.  Through our port incisions, 2 chest tubes were placed, a Blake drain  posteriorly and a standard Arg[place] chest tube anteriorly.  The lung reinflated nicely.  The incision was closed with interrupted 0 Vicryl in the muscle layers, running 2-0 Vicryl in subcutaneous tissue, and a 3-0 subcuticular stitch in skin edges.   Dermabond was placed along the incision, dressings on the chest tubes.  At the completion of the procedure, there was no air leak.  The patient was extubated in the operating room and transferred to the recovery room, having tolerated the procedure  without obvious complication.  Sponge and needle count was reported as correct.  Estimated blood loss approximately 150 mL.  The patient did not require any blood bank blood products during the operative procedure.  At the completion of the case, RF  scanning was done with a clear code reported.  LN/NUANCE  D:01/04/2019 T:01/05/2019 JOB:[id number]

## 2019-01-06 LAB — CBC
HCT: 32.4 % — ABNORMAL LOW (ref 39.0–52.0)
Hemoglobin: 11 g/dL — ABNORMAL LOW (ref 13.0–17.0)
MCH: 31 pg (ref 26.0–34.0)
MCHC: 34 g/dL (ref 30.0–36.0)
MCV: 91.3 fL (ref 80.0–100.0)
Platelets: 129 10*3/uL — ABNORMAL LOW (ref 150–400)
RBC: 3.55 MIL/uL — ABNORMAL LOW (ref 4.22–5.81)
RDW: 12.2 % (ref 11.5–15.5)
WBC: 6.8 10*3/uL (ref 4.0–10.5)
nRBC: 0 % (ref 0.0–0.2)

## 2019-01-06 LAB — BASIC METABOLIC PANEL
Anion gap: 10 (ref 5–15)
BUN: 17 mg/dL (ref 8–23)
CO2: 28 mmol/L (ref 22–32)
Calcium: 8.5 mg/dL — ABNORMAL LOW (ref 8.9–10.3)
Chloride: 103 mmol/L (ref 98–111)
Creatinine, Ser: 1.15 mg/dL (ref 0.61–1.24)
GFR calc Af Amer: >60 mL/min (ref >60)
GFR calc non Af Amer: >60 mL/min (ref >60)
GLUCOSE: 110 mg/dL — AB (ref 70–99)
Potassium: 3.7 mmol/L (ref 3.5–5.1)
Sodium: 141 mmol/L (ref 135–145)

## 2019-01-06 NOTE — Plan of Care (Signed)
  Problem: Education: Goal: Knowledge of General Education information will improve Description Including pain rating scale, medication(s)/side effects and non-pharmacologic comfort measures Outcome: Progressing   Problem: Health Behavior/Discharge Planning: Goal: Ability to manage health-related needs will improve Outcome: Progressing   Problem: Clinical Measurements: Goal: Ability to maintain clinical measurements within normal limits will improve Outcome: Progressing Goal: Respiratory complications will improve Outcome: Progressing Note:  Patient has remained on room air throughout shift.   Problem: Elimination: Goal: Will not experience complications related to bowel motility Outcome: Progressing Goal: Will not experience complications related to urinary retention Outcome: Progressing   Problem: Cardiac: Goal: Will achieve and/or maintain hemodynamic stability Outcome: Progressing   Problem: Clinical Measurements: Goal: Postoperative complications will be avoided or minimized Outcome: Progressing   Problem: Respiratory: Goal: Respiratory status will improve Outcome: Progressing   Problem: Pain Management: Goal: Pain level will decrease Outcome: Progressing

## 2019-01-06 NOTE — Plan of Care (Signed)

## 2019-01-06 NOTE — Progress Notes (Addendum)
5 Days Post-Op Procedure(s) (LRB): VIDEO ASSISTED THORACOSCOPY (VATS)/ LEFT LINGULECTOMY (Left) LYMPH NODE DISSECTION (Left) INTERCOSTAL NERVE BLOCK (Left) Subjective: Jack Taylor is awake and alert.  He is resting in bed.  Overall, he says he feels much better today.  He has not required supplemental oxygen since yesterday afternoon.  He said he continues to get shortness of breath with activity.  His oxygen saturation is currently 90% on room air.  He continues to require some assistance with mobility.  He has had no further difficulty with swallowing.  Objective: Vital signs in last 24 hours: Temp:  [97.7 F (36.5 C)-98.5 F (36.9 C)] 98 F (36.7 C) (02/02 0730) Pulse Rate:  [86-93] 90 (02/02 0425) Cardiac Rhythm: Normal sinus rhythm (02/02 0803) Resp:  [14-20] 17 (02/02 0425) BP: (127-146)/(66-72) 133/69 (02/02 0912) SpO2:  [91 %-98 %] 96 % (02/02 0720)     Intake/Output from previous day: 02/01 0701 - 02/02 0700 In: 720 [P.O.:720] Out: 0  Intake/Output this shift: Total I/O In: 250 [P.O.:240; I.V.:10] Out: -   General appearance: alert, cooperative and no distress Heart: regular rate and rhythm Lungs: clear to auscultation bilaterally Abdomen: soft, non-tender; bowel sounds normal; no masses,  no organomegaly Wound: The left thoracotomy incision is well approximated and dry.  Chest tube removal site dressing was removed.  The area is clean and dry.  Lab Results: Recent Labs    01/05/19 0307 01/06/19 0300  WBC 6.5 6.8  HGB 10.5* 11.0*  HCT 30.0* 32.4*  PLT 102* 129*   BMET:  Recent Labs    01/05/19 0307 01/06/19 0300  NA 140 141  K 3.7 3.7  CL 105 103  CO2 26 28  GLUCOSE 109* 110*  BUN 22 17  CREATININE 1.24 1.15  CALCIUM 8.4* 8.5*    PT/INR: No results for input(s): LABPROT, INR in the last 72 hours. ABG    Component Value Date/Time   PHART 7.296 (L) 01/02/2019 0400   HCO3 24.4 01/02/2019 0400   ACIDBASEDEF 1.2 01/02/2019 0400   O2SAT 94.4  01/02/2019 0400   CBG (last 3)  No results for input(s): GLUCAP in the last 72 hours.  Assessment/Plan: S/P Procedure(s) (LRB): VIDEO ASSISTED THORACOSCOPY (VATS)/ LEFT LINGULECTOMY (Left) LYMPH NODE DISSECTION (Left) INTERCOSTAL NERVE BLOCK (Left)  1.  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  Pathology findings have been discussed with the patient by Dr. Pia Mau.  Respiratory status is stable.  Chest tube removed on 01/04/2019.    He has been weaned to room air but desaturates easily with minimal activity. Continue working on mobility and pulmonary hygiene.  Anticipate possible discharge tomorrow morning if his respiratory status is stable. 2.  Postoperative urinary retention-resolved 3. Postoperative leukocytosis-resolved, no clinical evidence of infection 4.  Expected acute blood loss anemia.    Hematocrit is trending up No evidence of active bleeding.   5.  DVT prophylaxis-continue enoxaparin daily 6.  History of hypertension-control is appropriate    Antony Odea, PA-C [phone] 01/06/2019    LOS: 5 days    I have seen and examined the patient and agree with the assessment and plan as outlined.  Rexene Alberts, MD 01/06/2019 11:26 AM

## 2019-01-07 ENCOUNTER — Inpatient Hospital Stay (HOSPITAL_COMMUNITY): Payer: PPO

## 2019-01-07 MED ORDER — TAMSULOSIN HCL 0.4 MG PO CAPS: 0.4000 mg | ORAL_CAPSULE | Freq: Every day | ORAL | 0 refills | Status: DC

## 2019-01-07 MED ORDER — TRAMADOL HCL 50 MG PO TABS: 50.0000 mg | ORAL_TABLET | Freq: Four times a day (QID) | ORAL | 0 refills | Status: DC | PRN

## 2019-01-07 NOTE — Progress Notes (Signed)
Discharged home by wheelchair accompanied by wife. Discharge instructions given to pt. Belongings taken home.

## 2019-01-07 NOTE — Progress Notes (Addendum)
[address]       [place] [address]             [phone]      6 Days Post-Op Procedure(s) (LRB): VIDEO ASSISTED THORACOSCOPY (VATS)/ LEFT LINGULECTOMY (Left) LYMPH NODE DISSECTION (Left) INTERCOSTAL NERVE BLOCK (Left) Subjective: Feeling better each day  Objective: Vital signs in last 24 hours: Temp:  [98 F (36.7 C)-98.5 F (36.9 C)] 98.2 F (36.8 C) (02/03 0349) Pulse Rate:  [75-88] 88 (02/03 0349) Cardiac Rhythm: Sinus tachycardia (02/03 0732) Resp:  [15-19] 15 (02/03 0349) BP: (128-149)/(67-76) 149/75 (02/03 0349) SpO2:  [92 %-98 %] 96 % (02/03 0732)  Hemodynamic parameters for last 24 hours:    Intake/Output from previous day: 02/02 0701 - 02/03 0700 In: 994.5 [P.O.:675; I.V.:319.5] Out: 0  Intake/Output this shift: No intake/output data recorded.  General appearance: alert, cooperative and no distress Heart: regular rate and rhythm Lungs: minor ronchi in upper airway, significantly improved from previous exams last week Abdomen: benign Extremities: no edema or calf tenderness Wound: incis healing well  Lab Results: Recent Labs    01/05/19 0307 01/06/19 0300  WBC 6.5 6.8  HGB 10.5* 11.0*  HCT 30.0* 32.4*  PLT 102* 129*   BMET:  Recent Labs    01/05/19 0307 01/06/19 0300  NA 140 141  K 3.7 3.7  CL 105 103  CO2 26 28  GLUCOSE 109* 110*  BUN 22 17  CREATININE 1.24 1.15  CALCIUM 8.4* 8.5*    PT/INR: No results for input(s): LABPROT, INR in the last 72 hours. ABG    Component Value Date/Time   PHART 7.296 (L) 01/02/2019 0400   HCO3 24.4 01/02/2019 0400   ACIDBASEDEF 1.2 01/02/2019 0400   O2SAT 94.4 01/02/2019 0400   CBG (last 3)  No results for input(s): GLUCAP in the last 72 hours.  Meds Scheduled Meds: . amLODipine  10 mg Oral Daily  . arformoterol  15 mcg Nebulization BID  . bisacodyl  10 mg Oral Daily  . Chlorhexidine Gluconate Cloth  6 each Topical Daily  . dextromethorphan-guaiFENesin  1 tablet  Oral BID  . enoxaparin (LOVENOX) injection  30 mg Subcutaneous Q24H  . pantoprazole  40 mg Oral Daily  . rosuvastatin  10 mg Oral Daily  . senna-docusate  1 tablet Oral QHS  . sodium chloride flush  10-40 mL Intracatheter Q12H  . tamsulosin  0.4 mg Oral Daily  . umeclidinium bromide  1 puff Inhalation Daily   Continuous Infusions: . sodium chloride Stopped (01/06/19 1900)   PRN Meds:.alum & mag hydroxide-simeth, lactulose, levalbuterol, oxyCODONE, sodium chloride flush, traMADol  Xrays No results found.  Assessment/Plan: S/P Procedure(s) (LRB): VIDEO ASSISTED THORACOSCOPY (VATS)/ LEFT LINGULECTOMY (Left) LYMPH NODE DISSECTION (Left) INTERCOSTAL NERVE BLOCK (Left)  1 conts with good overall progress 2 hemodyn stable with HTN, Creat has normalized, will restart home ARB 3 sats good on RA, CXR improving ASD 4 occas sinus tachy- probably most related to deconditioning and soreness 5 no new labs 6 stable for discharge home today  LOS: 6 days    [name] PA-C 01/07/2019 Pager [phone]  Cr stable  Patient feels well Chest xray reviewed Plan home today Reviewed staging with patient and wife again  Cancer Staging Adenocarcinoma of left lung, stage 3 ([facility]) Staging form: Lung, AJCC 8th Edition - Clinical: Stage IIIA (cT1b, cN2, cM0) - Signed by Curt Bears, MD on 12/27/2018 - Pathologic stage from 01/03/2019: Stage IA3 (  pT1c, pN0, cM0) - Signed by Grace Isaac, MD on 01/03/2019 I have seen and examined Judson Roch and agree with the above assessment  and plan.  Grace Isaac MD Beeper [phone] Office [phone] 01/07/2019 8:35 AM

## 2019-01-11 ENCOUNTER — Other Ambulatory Visit: Payer: Self-pay | Admitting: *Deleted

## 2019-01-11 NOTE — Progress Notes (Signed)
The proposed treatment discussed in cancer conference 01/10/2019 is for discussion purpose only and is not a binding recommendation.  The patient was not physically examined nor present for their treatment options. Therefore, final treatment plans cannot be decided.

## 2019-01-14 ENCOUNTER — Other Ambulatory Visit: Payer: Self-pay

## 2019-01-14 ENCOUNTER — Ambulatory Visit (INDEPENDENT_AMBULATORY_CARE_PROVIDER_SITE_OTHER): Payer: Self-pay

## 2019-01-14 ENCOUNTER — Telehealth: Payer: Self-pay

## 2019-01-14 DIAGNOSIS — R300 Dysuria: Secondary | ICD-10-CM | POA: Diagnosis not present

## 2019-01-14 DIAGNOSIS — Z4802 Encounter for removal of sutures: Secondary | ICD-10-CM

## 2019-01-14 DIAGNOSIS — N390 Urinary tract infection, site not specified: Secondary | ICD-10-CM | POA: Diagnosis not present

## 2019-01-14 MED ORDER — TRAMADOL HCL 50 MG PO TABS: 50.0000 mg | ORAL_TABLET | Freq: Four times a day (QID) | ORAL | 0 refills | Status: AC | PRN

## 2019-01-14 NOTE — Progress Notes (Signed)
Patient arrived for nurse visit to remove 2 sutures post- procedure VATS/ Lingulectomy/ lymph node dissection 01/01/2019 with Dr. Servando Snare.  Sutures removed with no signs/ symptoms of infection noted.  Patient tolerated procedure well.  Patient/ family instructed to keep the incision sites clean and dry.  Patient/ family acknowledged instructions given.   Patient's wife stated he has been taking Tramadol every 6 hours and only has a few left.  Not enough for the entire day.  Patient stated that he is hurting and wife stated that he didn't realize how much pain he would be in.  Advised I would ask Dr. Servando Snare if a refill is appropriate and if so send it into [facility] in [place].  Patient's wife also stated that when he urinates "it burns".  I advised her to contact his PCP for an evaluation.  She acknowledged receipt.

## 2019-01-14 NOTE — Telephone Encounter (Signed)
Patient's wife contacted made aware that refill of Tramadol 50 mg Q6 hrs as needed for severe pain was faxed into preferred pharmacy, [facility] in [place].  Also advised per Junie Panning, [place] that medication is only to be taken for severe pain and this should be last refill for pain medication.  Patient may take Tylenol as well.  Patient family acknowledged receipt.

## 2019-01-21 ENCOUNTER — Other Ambulatory Visit: Payer: Self-pay | Admitting: Cardiothoracic Surgery

## 2019-01-21 DIAGNOSIS — C3492 Malignant neoplasm of unspecified part of left bronchus or lung: Secondary | ICD-10-CM

## 2019-01-22 ENCOUNTER — Ambulatory Visit: Payer: PPO | Admitting: Cardiothoracic Surgery

## 2019-01-22 ENCOUNTER — Encounter: Payer: Self-pay | Admitting: Cardiothoracic Surgery

## 2019-01-22 ENCOUNTER — Ambulatory Visit
Admission: RE | Admit: 2019-01-22 | Discharge: 2019-01-22 | Disposition: A | Discharge status: Home / Self Care | Payer: PPO | Source: Ambulatory Visit | Attending: Cardiothoracic Surgery | Admitting: Cardiothoracic Surgery

## 2019-01-22 ENCOUNTER — Other Ambulatory Visit: Payer: Self-pay

## 2019-01-22 VITALS — BP 110/68 | HR 76 | Resp 18 | Ht 68.0 in | Wt 172.0 lb

## 2019-01-22 DIAGNOSIS — R918 Other nonspecific abnormal finding of lung field: Secondary | ICD-10-CM

## 2019-01-22 DIAGNOSIS — C3492 Malignant neoplasm of unspecified part of left bronchus or lung: Secondary | ICD-10-CM

## 2019-01-22 DIAGNOSIS — J9811 Atelectasis: Secondary | ICD-10-CM | POA: Diagnosis not present

## 2019-01-22 NOTE — Progress Notes (Signed)
[address]       [place], [address]             [phone]      Vinayak T Puga [place] Medical Record #[id number] Date of Birth: 12-30-1945  Referring: [name], MD Primary Care: [name], MD Primary Cardiologist: No primary care provider on file.   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 SURGICAL PROCEDURE:  Left video-assisted thoracoscopy, left lingulectomy, lymph node dissection, and intercostal nerve block.  Cancer Staging Adenocarcinoma of left lung, stage 3 (HCC) Staging form: Lung, AJCC 8th Edition - Clinical: Stage IIIA (cT1b, cN2, cM0) - Signed by Curt Bears, MD on 12/27/2018 - Pathologic stage from 01/03/2019: Stage IA3 (pT1c, pN0, cM0) - Signed by Grace Isaac, MD on 01/03/2019   History of Present Illness:      Patient returns to the office today in follow-up after his recent lung resection, the final path ultimately showed non-small cell carcinoma stage I a 3, rather than the preop clinical stage concerning for stage IIIa.  The patient is making good progress following surgery.  His wife notes he is been active around the house.  He has had trouble sleeping, but this getting better.  Last week he had burning with urination and was seen at the [facility] urgent care, and was treated with Bactrim for a urinary tract infection.     Past Medical History:  Diagnosis Date  . Adenomatous colon polyp   . Anxiety   . Blood transfusion without reported diagnosis   . Cancer ([place])    Lung; 11/2018  . CKD (chronic kidney disease)    stage III 11/09/18  . COPD (chronic obstructive pulmonary disease) ([place])   . Gastric ulcer   . GERD (gastroesophageal reflux disease)   . High cholesterol   . Hypertension   . Pre-diabetes    A1c 6.2% 11/09/18      Social History   Tobacco Use  Smoking Status Former Smoker  . Packs/day: 0.25  . Years: 5.00  . Pack years: 1.25  . Types: Cigarettes  . Last attempt to quit: 11/04/2018  . Years since quitting: 0.2  Smokeless Tobacco Never Used  Tobacco Comment   recently quit when diagnosed with lung cancer.     Social History   Substance and Sexual Activity  Alcohol Use No     No Known Allergies  Current Outpatient Medications  Medication Sig Dispense Refill  . albuterol (PROVENTIL HFA;VENTOLIN HFA) 108 (90 Base) MCG/ACT inhaler Inhale 2 puffs into the lungs every 6 (six) hours as needed for wheezing or shortness of breath. Reported on 06/02/2016    . amLODipine (NORVASC) 10 MG tablet Take 10 mg by mouth daily.     Marland Kitchen esomeprazole (NEXIUM) 40 MG capsule Take 40 mg by mouth daily.     Marland Kitchen losartan (COZAAR) 50 MG tablet Take 50 mg by mouth daily.     . rosuvastatin (CRESTOR) 10 MG tablet Take 10 mg by mouth daily.    . tamsulosin (FLOMAX) 0.4 MG CAPS capsule Take 1 capsule (0.4 mg total) by mouth daily. 30 capsule 0  . Tiotropium Bromide-Olodaterol 2.5-2.5 MCG/ACT AERS Inhale 1 puff into the lungs daily.     No current facility-administered medications for this visit.  Physical Exam: BP 110/68 (BP Location: Right Arm, Patient Position: Sitting, Cuff Size: Normal) Comment: manual  Pulse 76   Resp 18   Ht 5\' 8"  (1.727 m)   Wt 172 lb (78 kg)   SpO2 95% Comment: RA  BMI 26.15 kg/m   General appearance: alert and cooperative Neurologic: intact Heart: regular rate and rhythm, S1, S2 normal, no murmur, click, rub or gallop Lungs: wheezes bibasilar Abdomen: soft, non-tender; bowel sounds normal; no masses,  no organomegaly Extremities: extremities normal, atraumatic, no cyanosis or edema and Homans sign is negative, no sign of DVT Wound: Left chest incision is and chest tube sites are healing well   Diagnostic Studies & Laboratory data:     Recent Radiology Findings:    Dg Chest 2 View  Result Date: 01/22/2019 CLINICAL DATA:  Adenocarcinoma left lung. EXAM: CHEST - 2 VIEW COMPARISON:  01/07/2019.  01/03/2019. FINDINGS: Mediastinum and hilar structures normal. Heart size normal. Postsurgical changes noted about the left perihilar region. Changes consistent with atelectasis also noted the left perihilar region. Similar findings noted on prior exam. No pleural effusion or pneumothorax. IMPRESSION: Postsurgical changes and atelectasis left perihilar region. Similar findings noted on prior exams. Electronically Signed   By: Marcello Moores  Register   On: 01/22/2019 14:19     I have independently reviewed the above radiology studies  and reviewed the findings with the patient.   Recent Lab Findings: Lab Results  Component Value Date   WBC 6.8 01/06/2019   HGB 11.0 (L) 01/06/2019   HCT 32.4 (L) 01/06/2019   PLT 129 (L) 01/06/2019   GLUCOSE 110 (H) 01/06/2019   ALT 22 01/03/2019   AST 33 01/03/2019   NA 141 01/06/2019   K 3.7 01/06/2019   CL 103 01/06/2019   CREATININE 1.15 01/06/2019   BUN 17 01/06/2019   CO2 28 01/06/2019   INR 1.00 12/31/2018      Assessment / Plan:      Patient status post lingulectomy for non-small cell stage Ia lung cancer.  The patient's pathology and radiographic findings were reviewed at the multidisciplinary thoracic oncology conference, no further treatment other than observation was recommended.  I discussed these findings with the patient and his wife.   We will plan to see him back in 2 to 3 months with a follow-up chest x-ray, with plan for follow-up CT scan of the chest 6 months postop.         Grace Isaac MD      [address] [place] [address] Office [phone]   Beeper [phone]  01/22/2019 3:28 PM

## 2019-04-17 ENCOUNTER — Other Ambulatory Visit: Payer: Self-pay | Admitting: Cardiothoracic Surgery

## 2019-04-17 DIAGNOSIS — C3492 Malignant neoplasm of unspecified part of left bronchus or lung: Secondary | ICD-10-CM

## 2019-04-18 ENCOUNTER — Other Ambulatory Visit: Payer: Self-pay

## 2019-04-18 ENCOUNTER — Encounter: Payer: Self-pay | Admitting: Cardiothoracic Surgery

## 2019-04-18 ENCOUNTER — Ambulatory Visit: Payer: PPO | Admitting: Cardiothoracic Surgery

## 2019-04-18 ENCOUNTER — Ambulatory Visit
Admission: RE | Admit: 2019-04-18 | Discharge: 2019-04-18 | Disposition: A | Discharge status: Home / Self Care | Payer: PPO | Source: Ambulatory Visit | Attending: Cardiothoracic Surgery | Admitting: Cardiothoracic Surgery

## 2019-04-18 ENCOUNTER — Other Ambulatory Visit: Payer: Self-pay | Admitting: *Deleted

## 2019-04-18 VITALS — BP 132/76 | HR 52 | Temp 97.9°F | Resp 20 | Ht 68.0 in | Wt 175.0 lb

## 2019-04-18 DIAGNOSIS — C3492 Malignant neoplasm of unspecified part of left bronchus or lung: Secondary | ICD-10-CM | POA: Diagnosis not present

## 2019-04-18 DIAGNOSIS — C3490 Malignant neoplasm of unspecified part of unspecified bronchus or lung: Secondary | ICD-10-CM

## 2019-04-18 DIAGNOSIS — R079 Chest pain, unspecified: Secondary | ICD-10-CM | POA: Diagnosis not present

## 2019-04-18 NOTE — Progress Notes (Signed)
[address]       [place] [address]             [phone]      Doy T Check [place] Medical Record #[id number] Date of Birth: [birth year]  Referring: Curt Bears, MD Primary Care: [name], MD Primary Cardiologist: No primary care provider on file.   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 SURGICAL PROCEDURE:  Left video-assisted thoracoscopy, left lingulectomy, lymph node dissection, and intercostal nerve block.  Cancer Staging Adenocarcinoma of left lung, stage 3 (HCC) Staging form: Lung, AJCC 8th Edition - Clinical: Stage IIIA (cT1b, cN2, cM0) - Signed by Curt Bears, MD on 12/27/2018 - Pathologic stage from 01/03/2019: Stage IA3 (pT1c, pN0, cM0) - Signed by Grace Isaac, MD on 01/03/2019   History of Present Illness:      Patient returns to the office today in follow-up after  lung resection, the final path ultimately showed non-small cell carcinoma stage I a 3, rather than the preop clinical stage concerning for stage IIIa.  Patient remains active working in his garden 6 to 7 hours a day.  He has some paresthesias over the left chest but this appears to be improving.  He has stopped smoking prior to his surgery and his not had any tobacco products since then.  Since not smoking he notes improvement in his respiratory status   Past Medical History:  Diagnosis Date  . Adenomatous colon polyp   . Anxiety   . Blood transfusion without reported diagnosis   . Cancer ([place])    Lung; 11/2018  . CKD (chronic kidney disease)    stage III 11/09/18  . COPD (chronic obstructive pulmonary disease) ([place])   . Gastric ulcer   . GERD (gastroesophageal reflux disease)   . High cholesterol   . Hypertension   . Pre-diabetes    A1c 6.2% 11/09/18     Social  History   Tobacco Use  Smoking Status Former Smoker  . Packs/day: 0.25  . Years: 5.00  . Pack years: 1.25  . Types: Cigarettes  . Last attempt to quit: 11/04/2018  . Years since quitting: 0.4  Smokeless Tobacco Never Used  Tobacco Comment   recently quit when diagnosed with lung cancer.     Social History   Substance and Sexual Activity  Alcohol Use No     No Known Allergies  Current Outpatient Medications  Medication Sig Dispense Refill  . albuterol (PROVENTIL HFA;VENTOLIN HFA) 108 (90 Base) MCG/ACT inhaler Inhale 2 puffs into the lungs every 6 (six) hours as needed for wheezing or shortness of breath. Reported on 06/02/2016    . amLODipine (NORVASC) 10 MG tablet Take 10 mg by mouth daily.     Marland Kitchen esomeprazole (NEXIUM) 40 MG capsule Take 40 mg by mouth daily.     Marland Kitchen losartan (COZAAR) 50 MG tablet Take 50 mg by mouth daily.     . rosuvastatin (CRESTOR) 10 MG tablet Take 10 mg by mouth daily.    . tamsulosin (FLOMAX) 0.4 MG CAPS capsule Take 1 capsule (0.4 mg total) by mouth daily. 30 capsule 0  . Tiotropium Bromide-Olodaterol 2.5-2.5 MCG/ACT AERS Inhale 1 puff into the lungs daily.     No current facility-administered medications for this visit.        Physical  Exam: BP 132/76   Pulse (!) 52   Temp 97.9 F (36.6 C)   Resp 20   Ht 5\' 8"  (1.727 m)   Wt 175 lb (79.4 kg)   SpO2 96% Comment: on RA  BMI 26.61 kg/m  General appearance: alert, appears stated age and no distress Head: Normocephalic, without obvious abnormality, atraumatic Resp: clear to auscultation bilaterally Cardio: regular rate and rhythm, S1, S2 normal, no murmur, click, rub or gallop GI: soft, non-tender; bowel sounds normal; no masses,  no organomegaly Extremities: extremities normal, atraumatic, no cyanosis or edema and Homans sign is negative, no sign of DVT Neurologic: Grossly normal Patient's incisions are well-healed He has no palpable cervical or supraclavicular or axillary lymph nodes   Diagnostic Studies & Laboratory data:     Recent Radiology Findings:   Dg Chest 2 View  Result Date: 04/18/2019 CLINICAL DATA:  Chest pain.  History of VATS procedure EXAM: CHEST - 2 VIEW COMPARISON:  January 22, 2019 FINDINGS: There is postoperative change with scarring and mild volume loss in the left mid lung, stable. The lungs elsewhere are clear. The heart size and pulmonary vascularity are normal. No adenopathy evident. No pneumothorax. No bone lesions. There is aortic atherosclerosis. There is right carotid artery calcification. IMPRESSION: Postoperative change on the left with scarring and volume loss, stable. No edema or consolidation. Stable cardiac silhouette. There is right carotid artery calcification. Aortic Atherosclerosis (ICD10-I70.0). Electronically Signed   By: Lowella Grip III M.D.   On: 04/18/2019 08:06     I have independently reviewed the above radiology studies  and reviewed the findings with the patient.   Recent Lab Findings: Lab Results  Component Value Date   WBC 6.8 01/06/2019   HGB 11.0 (L) 01/06/2019   HCT 32.4 (L) 01/06/2019   PLT 129 (L) 01/06/2019   GLUCOSE 110 (H) 01/06/2019   ALT 22 01/03/2019   AST 33 01/03/2019   NA 141 01/06/2019   K 3.7 01/06/2019   CL 103 01/06/2019   CREATININE 1.15 01/06/2019   BUN 17 01/06/2019   CO2 28 01/06/2019   INR 1.00 12/31/2018      Assessment / Plan:      Patient status post lingulectomy for non-small cell stage Ia lung cancer.  The patient's pathology and radiographic findings were reviewed at the multidisciplinary thoracic oncology conference, no further treatment other than observation was recommended.    Patient doing well postoperatively he has some paresthesias over the left chest, he has not been smoking since his surgery and notes his respiratory status is improved.  We will plan to have him return at the end of June which will be 6 months postop with a follow-up CT of the chest.     Grace Isaac MD      [address] [place] [address] Office [phone]   Beeper [phone]  04/18/2019 9:07 AM

## 2019-05-20 DIAGNOSIS — E7800 Pure hypercholesterolemia, unspecified: Secondary | ICD-10-CM | POA: Diagnosis not present

## 2019-05-20 DIAGNOSIS — J449 Chronic obstructive pulmonary disease, unspecified: Secondary | ICD-10-CM | POA: Diagnosis not present

## 2019-05-20 DIAGNOSIS — I129 Hypertensive chronic kidney disease with stage 1 through stage 4 chronic kidney disease, or unspecified chronic kidney disease: Secondary | ICD-10-CM | POA: Diagnosis not present

## 2019-05-20 DIAGNOSIS — N183 Chronic kidney disease, stage 3 (moderate): Secondary | ICD-10-CM | POA: Diagnosis not present

## 2019-05-20 DIAGNOSIS — C3490 Malignant neoplasm of unspecified part of unspecified bronchus or lung: Secondary | ICD-10-CM | POA: Diagnosis not present

## 2019-05-20 DIAGNOSIS — G4700 Insomnia, unspecified: Secondary | ICD-10-CM | POA: Diagnosis not present

## 2019-05-20 DIAGNOSIS — R419 Unspecified symptoms and signs involving cognitive functions and awareness: Secondary | ICD-10-CM | POA: Diagnosis not present

## 2019-05-20 DIAGNOSIS — Z0000 Encounter for general adult medical examination without abnormal findings: Secondary | ICD-10-CM | POA: Diagnosis not present

## 2019-05-20 DIAGNOSIS — R739 Hyperglycemia, unspecified: Secondary | ICD-10-CM | POA: Diagnosis not present

## 2019-05-20 DIAGNOSIS — Z87891 Personal history of nicotine dependence: Secondary | ICD-10-CM | POA: Diagnosis not present

## 2019-05-20 DIAGNOSIS — K219 Gastro-esophageal reflux disease without esophagitis: Secondary | ICD-10-CM | POA: Diagnosis not present

## 2019-05-30 ENCOUNTER — Other Ambulatory Visit: Payer: PPO

## 2019-05-30 ENCOUNTER — Ambulatory Visit
Admission: RE | Admit: 2019-05-30 | Discharge: 2019-05-30 | Disposition: A | Discharge status: Home / Self Care | Payer: PPO | Source: Ambulatory Visit | Attending: Cardiothoracic Surgery | Admitting: Cardiothoracic Surgery

## 2019-05-30 ENCOUNTER — Ambulatory Visit: Payer: PPO | Admitting: Cardiothoracic Surgery

## 2019-05-30 ENCOUNTER — Other Ambulatory Visit: Payer: Self-pay

## 2019-05-30 VITALS — BP 128/72 | HR 53 | Temp 97.7°F | Resp 20 | Ht 68.0 in | Wt 176.0 lb

## 2019-05-30 DIAGNOSIS — C3412 Malignant neoplasm of upper lobe, left bronchus or lung: Secondary | ICD-10-CM | POA: Diagnosis not present

## 2019-05-30 DIAGNOSIS — C3492 Malignant neoplasm of unspecified part of left bronchus or lung: Secondary | ICD-10-CM | POA: Diagnosis not present

## 2019-05-30 DIAGNOSIS — C3490 Malignant neoplasm of unspecified part of unspecified bronchus or lung: Secondary | ICD-10-CM

## 2019-05-30 NOTE — Progress Notes (Signed)
[address]       ,[place] [address]             [phone]      Jack Taylor [facility] Medical Record #[id number] Date of Birth: 1946-06-20  Referring: Curt Bears, MD Primary Care: [name], MD Primary Cardiologist: No primary care provider on file.   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 SURGICAL PROCEDURE:  Left video-assisted thoracoscopy, left lingulectomy, lymph node dissection, and intercostal nerve block.  Cancer Staging Adenocarcinoma of left lung, stage 3 (HCC) Staging form: Lung, AJCC 8th Edition - Clinical: Stage IIIA (cT1b, cN2, cM0) - Signed by Curt Bears, MD on 12/27/2018 - Pathologic stage from 01/03/2019: Stage IA3 (pT1c, pN0, cM0) - Signed by Grace Isaac, MD on 01/03/2019   History of Present Illness:      Patient returns to the office today in follow-up after  lung resection, the final path ultimately showed non-small cell carcinoma stage I a 3, rather than the preop clinical stage concerning for stage IIIa.  Patient continues to work in his garden on a regular basis.  Denies significant shortness of breath. Has remained off cigarettes since his surgery in January  Past Medical History:  Diagnosis Date   Adenomatous colon polyp    Anxiety    Blood transfusion without reported diagnosis    Cancer ([place])    Lung; 11/2018   CKD (chronic kidney disease)    stage III 11/09/18   COPD (chronic obstructive pulmonary disease) (HCC)    Gastric ulcer    GERD (gastroesophageal reflux disease)    High cholesterol    Hypertension    Pre-diabetes    A1c 6.2% 11/09/18     Social History   Tobacco Use  Smoking Status Former Smoker   Packs/day: 0.25   Years: 5.00   Pack years: 1.25   Types: Cigarettes   Quit date:  11/04/2018   Years since quitting: 0.5  Smokeless Tobacco Never Used  Tobacco Comment   recently quit when diagnosed with lung cancer.     Social History   Substance and Sexual Activity  Alcohol Use No     No Known Allergies  Current Outpatient Medications  Medication Sig Dispense Refill   albuterol (PROVENTIL HFA;VENTOLIN HFA) 108 (90 Base) MCG/ACT inhaler Inhale 2 puffs into the lungs every 6 (six) hours as needed for wheezing or shortness of breath. Reported on 06/02/2016     amLODipine (NORVASC) 10 MG tablet Take 10 mg by mouth daily.      esomeprazole (NEXIUM) 40 MG capsule Take 40 mg by mouth daily.      losartan (COZAAR) 50 MG tablet Take 50 mg by mouth daily.      rosuvastatin (CRESTOR) 10 MG tablet Take 10 mg by mouth daily.     Tiotropium Bromide-Olodaterol 2.5-2.5 MCG/ACT AERS Inhale 1 puff into the lungs daily.     No current facility-administered medications for this visit.        Physical Exam: BP 128/72    Pulse (!) 53    Temp 97.7 F (36.5 C) (Skin)    Resp 20    Ht 5\' 8"  (1.727 m)    Wt 176 lb (79.8 kg)    SpO2 92% Comment: RA   BMI 26.76 kg/m  General appearance:  alert, cooperative and no distress Head: Normocephalic, without obvious abnormality, atraumatic Neck: no adenopathy, no carotid bruit, no JVD, supple, symmetrical, trachea midline and thyroid not enlarged, symmetric, no tenderness/mass/nodules Lymph nodes: Cervical, supraclavicular, and axillary nodes normal. Resp: clear to auscultation bilaterally Cardio: regular rate and rhythm, S1, S2 normal, no murmur, click, rub or gallop GI: soft, non-tender; bowel sounds normal; no masses,  no organomegaly Extremities: extremities normal, atraumatic, no cyanosis or edema and Homans sign is negative, no sign of DVT Neurologic: Grossly normal Patient's incisions are well-healed He has no palpable cervical or supraclavicular or axillary lymph nodes  Diagnostic Studies & Laboratory data:     Recent  Radiology Findings:   Ct Chest Wo Contrast  Result Date: 05/30/2019 CLINICAL DATA:  Left upper lobe non-small cell lung cancer status post lingulectomy 01/01/2019, pathologic stage IA. Restaging. EXAM: CT CHEST WITHOUT CONTRAST TECHNIQUE: Multidetector CT imaging of the chest was performed following the standard protocol without IV contrast. COMPARISON:  12/11/2018 PET-CT.  11/20/2018 screening chest CT. FINDINGS: Cardiovascular: Normal heart size. No significant pericardial effusion/thickening. Three-vessel coronary atherosclerosis. Atherosclerotic nonaneurysmal thoracic aorta. Normal caliber pulmonary arteries. Mediastinum/Nodes: No discrete thyroid nodules. Unremarkable esophagus. No pathologically enlarged axillary, mediastinal or hilar lymph nodes, noting limited sensitivity for the detection of hilar adenopathy on this noncontrast study. Lungs/Pleura: No pneumothorax. No pleural effusion. Interval left upper lobe wedge resection. No central airway stenoses. Bandlike consolidation along the wedge resection suture line in the posterior left upper lobe is favored to represent postsurgical scarring. No acute consolidative airspace disease or lung masses. A few scattered solid pulmonary nodules in both lungs, largest 6 mm in the medial right upper lobe (series 8/image 45), all stable. No new significant pulmonary nodules. Upper abdomen: Small hiatal hernia. Musculoskeletal: No aggressive appearing focal osseous lesions. Moderate thoracic spondylosis. IMPRESSION: 1. Bandlike consolidation along the wedge resection suture line in the posterior left upper lobe, favor postsurgical scarring. Continued chest CT surveillance warranted. 2. No findings suspicious for metastatic disease in the chest. No thoracic adenopathy. Scattered subcentimeter pulmonary nodules are stable compared to the preoperative screening chest CT of 11/20/2018, and warrant continued chest CT surveillance. Aortic Atherosclerosis (ICD10-I70.0).  Electronically Signed   By: Ilona Sorrel M.D.   On: 05/30/2019 10:56     I have independently reviewed the above radiology studies  and reviewed the findings with the patient.   Recent Lab Findings: Lab Results  Component Value Date   WBC 6.8 01/06/2019   HGB 11.0 (L) 01/06/2019   HCT 32.4 (L) 01/06/2019   PLT 129 (L) 01/06/2019   GLUCOSE 110 (H) 01/06/2019   ALT 22 01/03/2019   AST 33 01/03/2019   NA 141 01/06/2019   K 3.7 01/06/2019   CL 103 01/06/2019   CREATININE 1.15 01/06/2019   BUN 17 01/06/2019   CO2 28 01/06/2019   INR 1.00 12/31/2018      Assessment / Plan:      Patient status post lingulectomy for non-small cell stage Ia lung cancer.  The patient's pathology and radiographic findings were reviewed at the multidisciplinary thoracic oncology conference, no further treatment other than observation was recommended.   ing since his surgery and notes his respiratory status is improved.  We will plan to have him return in 6 months  with a follow-up CT of the chest.  Current CT scan shows no evidence of recurrent disease now 6 months postop   Grace Isaac MD      [address] [place] [address] Office [phone]   Beeper [phone]  05/30/2019 12:24 PM

## 2019-09-30 DIAGNOSIS — R319 Hematuria, unspecified: Secondary | ICD-10-CM | POA: Diagnosis not present

## 2019-09-30 DIAGNOSIS — N451 Epididymitis: Secondary | ICD-10-CM | POA: Diagnosis not present

## 2019-10-22 ENCOUNTER — Other Ambulatory Visit: Payer: Self-pay | Admitting: *Deleted

## 2019-10-22 DIAGNOSIS — C3492 Malignant neoplasm of unspecified part of left bronchus or lung: Secondary | ICD-10-CM

## 2019-11-14 ENCOUNTER — Ambulatory Visit
Admission: RE | Admit: 2019-11-14 | Discharge: 2019-11-14 | Disposition: A | Discharge status: Home / Self Care | Payer: PPO | Source: Ambulatory Visit | Attending: Cardiothoracic Surgery | Admitting: Cardiothoracic Surgery

## 2019-11-14 ENCOUNTER — Ambulatory Visit: Payer: PPO | Admitting: Cardiothoracic Surgery

## 2019-11-14 ENCOUNTER — Other Ambulatory Visit: Payer: Self-pay

## 2019-11-14 VITALS — BP 124/65 | HR 54 | Temp 97.9°F | Resp 20 | Ht 68.0 in | Wt 176.0 lb

## 2019-11-14 DIAGNOSIS — R918 Other nonspecific abnormal finding of lung field: Secondary | ICD-10-CM | POA: Diagnosis not present

## 2019-11-14 DIAGNOSIS — C3492 Malignant neoplasm of unspecified part of left bronchus or lung: Secondary | ICD-10-CM

## 2019-11-14 NOTE — Progress Notes (Signed)
[address]       [place] [address]             [phone]      Jack Taylor Medical Record #[id number] Date of Birth: 1946/10/28  Referring: Curt Bears, MD Primary Care: [name], MD Primary Cardiologist: No primary care provider on file.   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 SURGICAL PROCEDURE:  Left video-assisted thoracoscopy, left lingulectomy, lymph node dissection, and intercostal nerve block.  Cancer Staging Adenocarcinoma of left lung, stage 3 (HCC) Staging form: Lung, AJCC 8th Edition - Clinical: Stage IIIA (cT1b, cN2, cM0) - Signed by Curt Bears, MD on 12/27/2018 - Pathologic stage from 01/03/2019: Stage IA3 (pT1c, pN0, cM0) - Signed by Grace Isaac, MD on 01/03/2019   History of Present Illness:     Patient returns to the office today in follow-up after lung resection done in January 2020.  At that time the final path showed non-small cell carcinoma stage Ia 3, preoperatively this has been clinically staged as 3-day.  On review multidisciplinary thoracic oncology conference note additional treatment other than observation was recommended.  The patient continues to be active working in his garden without symptoms.  He is remained off cigarettes since his surgery in January   Past Medical History:  Diagnosis Date  . Adenomatous colon polyp   . Anxiety   . Blood transfusion without reported diagnosis   . Cancer ([place])    Lung; 11/2018  . CKD (chronic kidney disease)    stage III 11/09/18  . COPD (chronic obstructive pulmonary disease) ([place])   . Gastric ulcer   . GERD (gastroesophageal reflux disease)   . High cholesterol   . Hypertension   . Pre-diabetes    A1c 6.2% 11/09/18     Social History   Tobacco Use  Smoking  Status Former Smoker  . Packs/day: 0.25  . Years: 5.00  . Pack years: 1.25  . Types: Cigarettes  . Quit date: 11/04/2018  . Years since quitting: 1.0  Smokeless Tobacco Never Used  Tobacco Comment   recently quit when diagnosed with lung cancer.     Social History   Substance and Sexual Activity  Alcohol Use No     No Known Allergies  Current Outpatient Medications  Medication Sig Dispense Refill  . albuterol (PROVENTIL HFA;VENTOLIN HFA) 108 (90 Base) MCG/ACT inhaler Inhale 2 puffs into the lungs every 6 (six) hours as needed for wheezing or shortness of breath. Reported on 06/02/2016    . amLODipine (NORVASC) 10 MG tablet Take 10 mg by mouth daily.     Marland Kitchen esomeprazole (NEXIUM) 40 MG capsule Take 40 mg by mouth daily.     Marland Kitchen losartan (COZAAR) 50 MG tablet Take 50 mg by mouth daily.     . rosuvastatin (CRESTOR) 10 MG tablet Take 10 mg by mouth daily.    . Tiotropium Bromide-Olodaterol 2.5-2.5 MCG/ACT AERS Inhale 1 puff into the lungs daily.     No current facility-administered medications for this visit.       Physical Exam: BP 124/65   Pulse (!) 54   Temp 97.9 F (36.6 C) (Skin)   Resp 20   Ht 5\' 8"  (1.727 m)   Wt 79.8 kg   SpO2 94% Comment: RA  BMI  26.76 kg/m  General appearance: alert, cooperative and no distress Neck: no adenopathy, no carotid bruit, no JVD, supple, symmetrical, trachea midline and thyroid not enlarged, symmetric, no tenderness/mass/nodules Lymph nodes: Cervical, supraclavicular, and axillary nodes normal. Resp: clear to auscultation bilaterally Cardio: regular rate and rhythm, S1, S2 normal, no murmur, click, rub or gallop GI: soft, non-tender; bowel sounds normal; no masses,  no organomegaly Extremities: extremities normal, atraumatic, no cyanosis or edema Neurologic: Grossly normal Patient's incisions are well-healed He has no palpable cervical or supraclavicular or axillary lymph nodes  Diagnostic Studies & Laboratory data:     Recent  Radiology Findings:   CT CHEST WO CONTRAST  Result Date: 11/14/2019 CLINICAL DATA:  73 year old male with history of metastatic non-small cell lung cancer. EXAM: CT CHEST WITHOUT CONTRAST TECHNIQUE: Multidetector CT imaging of the chest was performed following the standard protocol without IV contrast. COMPARISON:  Chest CT 05/30/2019. FINDINGS: Cardiovascular: Heart size is normal. There is no significant pericardial fluid, thickening or pericardial calcification. There is aortic atherosclerosis, as well as atherosclerosis of the great vessels of the mediastinum and the coronary arteries, including calcified atherosclerotic plaque in the left main, left anterior descending, left circumflex and right coronary arteries. Mediastinum/Nodes: No pathologically enlarged mediastinal or hilar lymph nodes. Please note that accurate exclusion of hilar adenopathy is limited on noncontrast CT scans. Esophagus is unremarkable in appearance. No axillary lymphadenopathy. Lungs/Pleura: Postoperative changes of wedge resection are again noted in the posterior aspect of the left upper lobe abutting the major fissure. No unexpected soft tissue mass in this region to suggest locally recurrent disease. There continues to be several small pulmonary nodules scattered throughout the lungs bilaterally which are stable in number and size compared to the prior study, largest of which measures 6 x 3 mm (axial image 50 of series 8) in the medial aspect of the right upper lobe. No other larger more suspicious appearing pulmonary nodules or masses are noted. No acute consolidative airspace disease. No pleural effusions. Mild scarring in the right lung base. Upper Abdomen: Aortic atherosclerosis. Musculoskeletal: There are no aggressive appearing lytic or blastic lesions noted in the visualized portions of the skeleton. IMPRESSION: 1. Stable appearance of the chest with postoperative changes of wedge resection in the posterior aspect of the  left upper lobe, and multiple small pulmonary nodules in the lungs bilaterally which are unchanged and favored to be benign. No definite findings to suggest locally recurrent disease or metastatic disease. 2. Aortic atherosclerosis, in addition to left main and 3 vessel coronary artery disease. Assessment for potential risk factor modification, dietary therapy or pharmacologic therapy may be warranted, if clinically indicated. Aortic Atherosclerosis (ICD10-I70.0). Electronically Signed   By: Vinnie Langton M.D.   On: 11/14/2019 10:26     I have independently reviewed the above radiology studies  and reviewed the findings with the patient.   Recent Lab Findings: Lab Results  Component Value Date   WBC 6.8 01/06/2019   HGB 11.0 (L) 01/06/2019   HCT 32.4 (L) 01/06/2019   PLT 129 (L) 01/06/2019   GLUCOSE 110 (H) 01/06/2019   ALT 22 01/03/2019   AST 33 01/03/2019   NA 141 01/06/2019   K 3.7 01/06/2019   CL 103 01/06/2019   CREATININE 1.15 01/06/2019   BUN 17 01/06/2019   CO2 28 01/06/2019   INR 1.00 12/31/2018      Assessment / Plan:    Patient status post lingulectomy for non-small cell stage Ia lung cancer.  Patient stable  without evidence of recurrence by CT scan almost 1 year postop.  Scans were reviewed with the patient and his wife-we will plan follow-up CT scan in 6 months   Grace Isaac MD      [address] [place] [address] Office [phone]   Beeper [phone]  11/14/2019 12:12 PM

## 2019-11-18 DIAGNOSIS — K219 Gastro-esophageal reflux disease without esophagitis: Secondary | ICD-10-CM | POA: Diagnosis not present

## 2019-11-18 DIAGNOSIS — Z87891 Personal history of nicotine dependence: Secondary | ICD-10-CM | POA: Diagnosis not present

## 2019-11-18 DIAGNOSIS — F419 Anxiety disorder, unspecified: Secondary | ICD-10-CM | POA: Diagnosis not present

## 2019-11-18 DIAGNOSIS — E7800 Pure hypercholesterolemia, unspecified: Secondary | ICD-10-CM | POA: Diagnosis not present

## 2019-11-18 DIAGNOSIS — N1831 Chronic kidney disease, stage 3a: Secondary | ICD-10-CM | POA: Diagnosis not present

## 2019-11-18 DIAGNOSIS — J449 Chronic obstructive pulmonary disease, unspecified: Secondary | ICD-10-CM | POA: Diagnosis not present

## 2019-11-18 DIAGNOSIS — R7303 Prediabetes: Secondary | ICD-10-CM | POA: Diagnosis not present

## 2019-11-18 DIAGNOSIS — I129 Hypertensive chronic kidney disease with stage 1 through stage 4 chronic kidney disease, or unspecified chronic kidney disease: Secondary | ICD-10-CM | POA: Diagnosis not present

## 2020-03-17 DIAGNOSIS — N1832 Chronic kidney disease, stage 3b: Secondary | ICD-10-CM | POA: Diagnosis not present

## 2020-03-17 DIAGNOSIS — R0602 Shortness of breath: Secondary | ICD-10-CM | POA: Diagnosis not present

## 2020-03-17 DIAGNOSIS — R05 Cough: Secondary | ICD-10-CM | POA: Diagnosis not present

## 2020-03-17 DIAGNOSIS — Z20822 Contact with and (suspected) exposure to covid-19: Secondary | ICD-10-CM | POA: Diagnosis not present

## 2020-03-17 DIAGNOSIS — R0902 Hypoxemia: Secondary | ICD-10-CM | POA: Diagnosis not present

## 2020-03-17 DIAGNOSIS — J441 Chronic obstructive pulmonary disease with (acute) exacerbation: Secondary | ICD-10-CM | POA: Diagnosis not present

## 2020-03-18 DIAGNOSIS — Z902 Acquired absence of lung [part of]: Secondary | ICD-10-CM | POA: Diagnosis not present

## 2020-03-18 DIAGNOSIS — Z7982 Long term (current) use of aspirin: Secondary | ICD-10-CM | POA: Diagnosis not present

## 2020-03-18 DIAGNOSIS — R0902 Hypoxemia: Secondary | ICD-10-CM | POA: Diagnosis not present

## 2020-03-18 DIAGNOSIS — Z20822 Contact with and (suspected) exposure to covid-19: Secondary | ICD-10-CM | POA: Diagnosis not present

## 2020-03-18 DIAGNOSIS — J441 Chronic obstructive pulmonary disease with (acute) exacerbation: Secondary | ICD-10-CM | POA: Diagnosis not present

## 2020-03-18 DIAGNOSIS — E7800 Pure hypercholesterolemia, unspecified: Secondary | ICD-10-CM | POA: Diagnosis not present

## 2020-03-18 DIAGNOSIS — R05 Cough: Secondary | ICD-10-CM | POA: Diagnosis not present

## 2020-03-18 DIAGNOSIS — J9611 Chronic respiratory failure with hypoxia: Secondary | ICD-10-CM | POA: Diagnosis not present

## 2020-03-18 DIAGNOSIS — I129 Hypertensive chronic kidney disease with stage 1 through stage 4 chronic kidney disease, or unspecified chronic kidney disease: Secondary | ICD-10-CM | POA: Diagnosis not present

## 2020-03-18 DIAGNOSIS — N1832 Chronic kidney disease, stage 3b: Secondary | ICD-10-CM | POA: Diagnosis not present

## 2020-03-18 DIAGNOSIS — Z87891 Personal history of nicotine dependence: Secondary | ICD-10-CM | POA: Diagnosis not present

## 2020-03-18 DIAGNOSIS — I440 Atrioventricular block, first degree: Secondary | ICD-10-CM | POA: Diagnosis not present

## 2020-03-18 DIAGNOSIS — R0602 Shortness of breath: Secondary | ICD-10-CM | POA: Diagnosis not present

## 2020-03-18 DIAGNOSIS — C3490 Malignant neoplasm of unspecified part of unspecified bronchus or lung: Secondary | ICD-10-CM | POA: Diagnosis not present

## 2020-03-18 DIAGNOSIS — N189 Chronic kidney disease, unspecified: Secondary | ICD-10-CM | POA: Diagnosis not present

## 2020-03-18 DIAGNOSIS — Z85118 Personal history of other malignant neoplasm of bronchus and lung: Secondary | ICD-10-CM | POA: Diagnosis not present

## 2020-03-25 DIAGNOSIS — J449 Chronic obstructive pulmonary disease, unspecified: Secondary | ICD-10-CM | POA: Diagnosis not present

## 2020-03-25 DIAGNOSIS — Z85118 Personal history of other malignant neoplasm of bronchus and lung: Secondary | ICD-10-CM | POA: Diagnosis not present

## 2020-04-17 ENCOUNTER — Other Ambulatory Visit: Payer: Self-pay | Admitting: *Deleted

## 2020-04-17 DIAGNOSIS — R918 Other nonspecific abnormal finding of lung field: Secondary | ICD-10-CM

## 2020-05-20 DIAGNOSIS — N1831 Chronic kidney disease, stage 3a: Secondary | ICD-10-CM | POA: Diagnosis not present

## 2020-05-20 DIAGNOSIS — R7303 Prediabetes: Secondary | ICD-10-CM | POA: Diagnosis not present

## 2020-05-20 DIAGNOSIS — Z0000 Encounter for general adult medical examination without abnormal findings: Secondary | ICD-10-CM | POA: Diagnosis not present

## 2020-05-20 DIAGNOSIS — Z8719 Personal history of other diseases of the digestive system: Secondary | ICD-10-CM | POA: Diagnosis not present

## 2020-05-20 DIAGNOSIS — E7800 Pure hypercholesterolemia, unspecified: Secondary | ICD-10-CM | POA: Diagnosis not present

## 2020-05-20 DIAGNOSIS — K219 Gastro-esophageal reflux disease without esophagitis: Secondary | ICD-10-CM | POA: Diagnosis not present

## 2020-05-20 DIAGNOSIS — I129 Hypertensive chronic kidney disease with stage 1 through stage 4 chronic kidney disease, or unspecified chronic kidney disease: Secondary | ICD-10-CM | POA: Diagnosis not present

## 2020-05-20 DIAGNOSIS — Z85118 Personal history of other malignant neoplasm of bronchus and lung: Secondary | ICD-10-CM | POA: Diagnosis not present

## 2020-05-20 DIAGNOSIS — J449 Chronic obstructive pulmonary disease, unspecified: Secondary | ICD-10-CM | POA: Diagnosis not present

## 2020-05-20 DIAGNOSIS — H9190 Unspecified hearing loss, unspecified ear: Secondary | ICD-10-CM | POA: Diagnosis not present

## 2020-05-21 ENCOUNTER — Ambulatory Visit: Payer: PPO | Admitting: Cardiothoracic Surgery

## 2020-05-21 ENCOUNTER — Ambulatory Visit
Admission: RE | Admit: 2020-05-21 | Discharge: 2020-05-21 | Disposition: A | Discharge status: Home / Self Care | Payer: PPO | Source: Ambulatory Visit | Attending: Cardiothoracic Surgery | Admitting: Cardiothoracic Surgery

## 2020-05-21 ENCOUNTER — Other Ambulatory Visit: Payer: Self-pay

## 2020-05-21 VITALS — BP 118/62 | HR 60 | Temp 98.1°F | Resp 20 | Ht 68.0 in | Wt 176.0 lb

## 2020-05-21 DIAGNOSIS — C3490 Malignant neoplasm of unspecified part of unspecified bronchus or lung: Secondary | ICD-10-CM

## 2020-05-21 DIAGNOSIS — R918 Other nonspecific abnormal finding of lung field: Secondary | ICD-10-CM | POA: Diagnosis not present

## 2020-05-21 DIAGNOSIS — C3492 Malignant neoplasm of unspecified part of left bronchus or lung: Secondary | ICD-10-CM | POA: Diagnosis not present

## 2020-05-21 NOTE — Progress Notes (Signed)
[address]       [place] [address]             [phone]      Jack Taylor [place] Medical Record #[id number] Date of Birth: Sep 09, 1946  Referring: Curt Bears, MD Primary Care: [name], MD Primary Cardiologist: No primary care provider on file.   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 SURGICAL PROCEDURE:  Left video-assisted thoracoscopy, left lingulectomy, lymph node dissection, and intercostal nerve block.  Cancer Staging Adenocarcinoma of left lung, stage 3 (HCC) Staging form: Lung, AJCC 8th Edition - Clinical: Stage IIIA (cT1b, cN2, cM0) - Signed by Curt Bears, MD on 12/27/2018 - Pathologic stage from 01/03/2019: Stage IA3 (pT1c, pN0, cM0) - Signed by Grace Isaac, MD on 01/03/2019   History of Present Illness:     Patient returns to the office today in follow-up after lung resection done in January 2020.1 1/2 years ago   final path showed non-small cell carcinoma stage IA3    On review multidisciplinary thoracic oncology conference note additional treatment other than observation was recommended.  The patient continues to be active working in his garden without symptoms.  He is remained off cigarettes since his surgery  Patient notes that he was hospitalized for 2 days at [facility] for shortness of breath and respiratory difficulty, he was never told any specific diagnosis but noted improved.   Patient remains active outside doing physical work.   Past Medical History:  Diagnosis Date  . Adenomatous colon polyp   . Anxiety   . Blood transfusion without reported diagnosis   . Cancer ([place])    Lung; 11/2018  . CKD (chronic kidney disease)    stage III 11/09/18  . COPD (chronic obstructive pulmonary disease) ([place])   . Gastric  ulcer   . GERD (gastroesophageal reflux disease)   . High cholesterol   . Hypertension   . Pre-diabetes    A1c 6.2% 11/09/18     Social History   Tobacco Use  Smoking Status Former Smoker  . Packs/day: 0.25  . Years: 5.00  . Pack years: 1.25  . Types: Cigarettes  . Quit date: 11/04/2018  . Years since quitting: 1.5  Smokeless Tobacco Never Used  Tobacco Comment   recently quit when diagnosed with lung cancer.     Social History   Substance and Sexual Activity  Alcohol Use No     No Known Allergies  Current Outpatient Medications  Medication Sig Dispense Refill  . albuterol (PROVENTIL HFA;VENTOLIN HFA) 108 (90 Base) MCG/ACT inhaler Inhale 2 puffs into the lungs every 6 (six) hours as needed for wheezing or shortness of breath. Reported on 06/02/2016    . amLODipine (NORVASC) 10 MG tablet Take 10 mg by mouth daily.     Marland Kitchen esomeprazole (NEXIUM) 40 MG capsule Take 40 mg by mouth daily.     Marland Kitchen losartan (COZAAR) 50 MG tablet Take 50 mg by mouth daily.     . rosuvastatin (CRESTOR) 10 MG tablet Take 10 mg by mouth daily.    . Tiotropium Bromide-Olodaterol 2.5-2.5 MCG/ACT AERS Inhale 1 puff into the lungs daily.     No current facility-administered medications for this visit.       Physical Exam: BP 118/62   Pulse 60   Temp  98.1 F (36.7 C) (Skin)   Resp 20   Ht 5\' 8"  (1.727 m)   Wt 176 lb (79.8 kg)   SpO2 92% Comment: RA  BMI 26.76 kg/m  General appearance: alert, cooperative and no distress Head: Normocephalic, without obvious abnormality, atraumatic Neck: no adenopathy, no carotid bruit, no JVD, supple, symmetrical, trachea midline and thyroid not enlarged, symmetric, no tenderness/mass/nodules Lymph nodes: Cervical, supraclavicular, and axillary nodes normal. Resp: clear to auscultation bilaterally Cardio: regular rate and rhythm, S1, S2 normal, no murmur, click, rub or gallop GI: soft, non-tender; bowel sounds normal; no masses,  no organomegaly Extremities:  extremities normal, atraumatic, no cyanosis or edema and Homans sign is negative, no sign of DVT Neurologic: Grossly normal  Patient's incisions are well-healed He has no palpable cervical or supraclavicular or axillary lymph nodes  Diagnostic Studies & Laboratory data:     Recent Radiology Findings:  CT CHEST WO CONTRAST  Result Date: 05/21/2020 CLINICAL DATA:  74 year old male with history of pulmonary nodule. EXAM: CT CHEST WITHOUT CONTRAST TECHNIQUE: Multidetector CT imaging of the chest was performed following the standard protocol without IV contrast. COMPARISON:  Chest CT 11/14/2019. FINDINGS: Cardiovascular: Heart size is normal. There is no significant pericardial fluid, thickening or pericardial calcification. There is aortic atherosclerosis, as well as atherosclerosis of the great vessels of the mediastinum and the coronary arteries, including calcified atherosclerotic plaque in the left main, left anterior descending, left circumflex and right coronary arteries. Mediastinum/Nodes: No pathologically enlarged mediastinal or hilar lymph nodes. Please note that accurate exclusion of hilar adenopathy is limited on noncontrast CT scans. Esophagus is unremarkable in appearance. No axillary lymphadenopathy. Lungs/Pleura: Postoperative changes of wedge resection are again noted in the posterior aspect of the left upper lobe where there is some mild chronic scarring and architectural distortion. Several small pulmonary nodules are again noted in the lungs, largest of which measure only 4 mm (axial image 42 of series 8), either stable or decreased in size compared to the prior examination. No other larger more suspicious appearing pulmonary nodules or masses are noted. No acute consolidative airspace disease. No pleural effusions. Upper Abdomen: Aortic atherosclerosis. Musculoskeletal: There are no aggressive appearing lytic or blastic lesions noted in the visualized portions of the skeleton. IMPRESSION:  1. All previously noted pulmonary nodules are stable to decreased in size compared to the prior examination, considered benign. No other new suspicious appearing pulmonary nodules or masses are noted. 2. Aortic atherosclerosis, in addition to left main and 3 vessel coronary artery disease. Assessment for potential risk factor modification, dietary therapy or pharmacologic therapy may be warranted, if clinically indicated. Aortic Atherosclerosis (ICD10-I70.0). Electronically Signed   By: Vinnie Langton M.D.   On: 05/21/2020 15:47    CT CHEST WO CONTRAST  Result Date: 11/14/2019 CLINICAL DATA:  74 year old male with history of metastatic non-small cell lung cancer. EXAM: CT CHEST WITHOUT CONTRAST TECHNIQUE: Multidetector CT imaging of the chest was performed following the standard protocol without IV contrast. COMPARISON:  Chest CT 05/30/2019. FINDINGS: Cardiovascular: Heart size is normal. There is no significant pericardial fluid, thickening or pericardial calcification. There is aortic atherosclerosis, as well as atherosclerosis of the great vessels of the mediastinum and the coronary arteries, including calcified atherosclerotic plaque in the left main, left anterior descending, left circumflex and right coronary arteries. Mediastinum/Nodes: No pathologically enlarged mediastinal or hilar lymph nodes. Please note that accurate exclusion of hilar adenopathy is limited on noncontrast CT scans. Esophagus is unremarkable in appearance. No axillary lymphadenopathy. Lungs/Pleura:  Postoperative changes of wedge resection are again noted in the posterior aspect of the left upper lobe abutting the major fissure. No unexpected soft tissue mass in this region to suggest locally recurrent disease. There continues to be several small pulmonary nodules scattered throughout the lungs bilaterally which are stable in number and size compared to the prior study, largest of which measures 6 x 3 mm (axial image 50 of series 8)  in the medial aspect of the right upper lobe. No other larger more suspicious appearing pulmonary nodules or masses are noted. No acute consolidative airspace disease. No pleural effusions. Mild scarring in the right lung base. Upper Abdomen: Aortic atherosclerosis. Musculoskeletal: There are no aggressive appearing lytic or blastic lesions noted in the visualized portions of the skeleton. IMPRESSION: 1. Stable appearance of the chest with postoperative changes of wedge resection in the posterior aspect of the left upper lobe, and multiple small pulmonary nodules in the lungs bilaterally which are unchanged and favored to be benign. No definite findings to suggest locally recurrent disease or metastatic disease. 2. Aortic atherosclerosis, in addition to left main and 3 vessel coronary artery disease. Assessment for potential risk factor modification, dietary therapy or pharmacologic therapy may be warranted, if clinically indicated. Aortic Atherosclerosis (ICD10-I70.0). Electronically Signed   By: Vinnie Langton M.D.   On: 11/14/2019 10:26     I have independently reviewed the above radiology studies  and reviewed the findings with the patient.   Recent Lab Findings: Lab Results  Component Value Date   WBC 6.8 01/06/2019   HGB 11.0 (L) 01/06/2019   HCT 32.4 (L) 01/06/2019   PLT 129 (L) 01/06/2019   GLUCOSE 110 (H) 01/06/2019   ALT 22 01/03/2019   AST 33 01/03/2019   NA 141 01/06/2019   K 3.7 01/06/2019   CL 103 01/06/2019   CREATININE 1.15 01/06/2019   BUN 17 01/06/2019   CO2 28 01/06/2019   INR 1.00 12/31/2018      Assessment / Plan:    Patient status post lingulectomy for non-small cell stage Ia lung cancer.  Stage IA3.  Without evidence of recurrence on CT scan of the chest done today, will plan follow-up CT in 6 months which will be approximately 2 years postop.    Grace Isaac MD      [address] [place] [address] Office [phone]   Beeper  [phone]  05/21/2020 4:01 PM

## 2020-05-29 DIAGNOSIS — H903 Sensorineural hearing loss, bilateral: Secondary | ICD-10-CM | POA: Diagnosis not present

## 2020-08-13 IMAGING — CT CT CHEST LUNG CANCER SCREENING LOW DOSE W/O CM
2 of 5 series · 15 of 40 positions shown, 18 images · non-contrast
Comparison: 11/22/2011 chest CT angiogram. 08/27/2015 chest
radiograph.

CLINICAL DATA: 72-year-old asymptomatic male current smoker with 49
pack-year smoking history.

EXAM:
CT CHEST WITHOUT CONTRAST LOW-DOSE FOR LUNG CANCER SCREENING
TECHNIQUE: Multidetector CT imaging of the chest was performed following the
standard protocol without IV contrast.

[Series 4: lung 1.00 br44 cor · coronal · 0.63mm/px · 3 of 304 slices shown]
[im 61/304  lung]
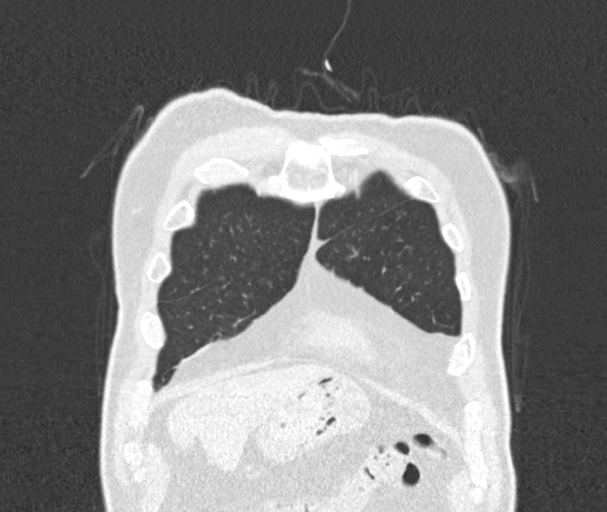
[im 122/304  lung]
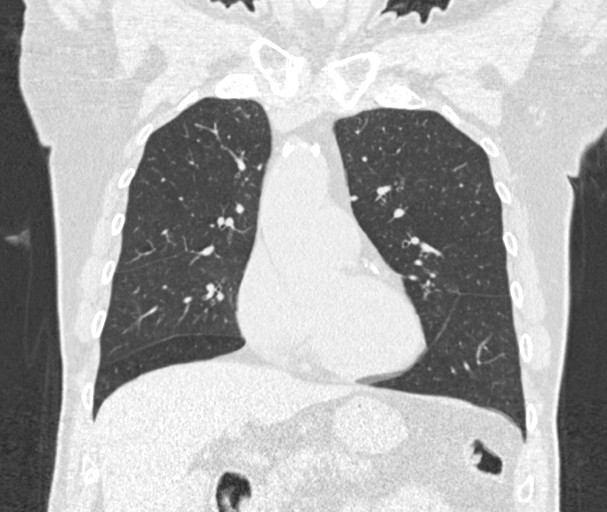
[im 182/304  lung]
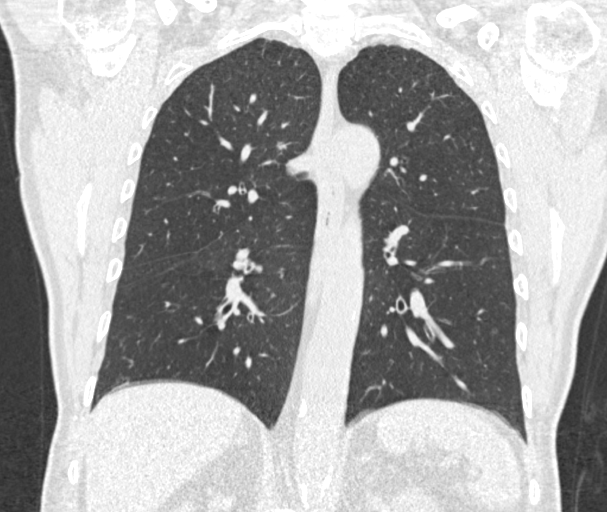

[Series 9: lung 1.00 br60 · axial · 0.75mm/px · z∈[-977,-686]mm · 12 of 321 slices shown, 15 images]
[im 15/321  mediastinal]
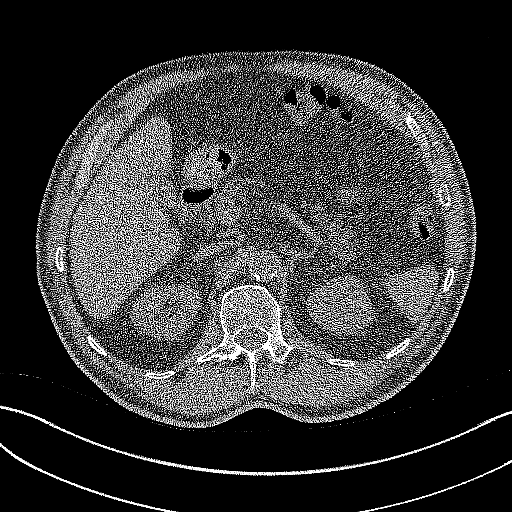
[im 15/321  lung]
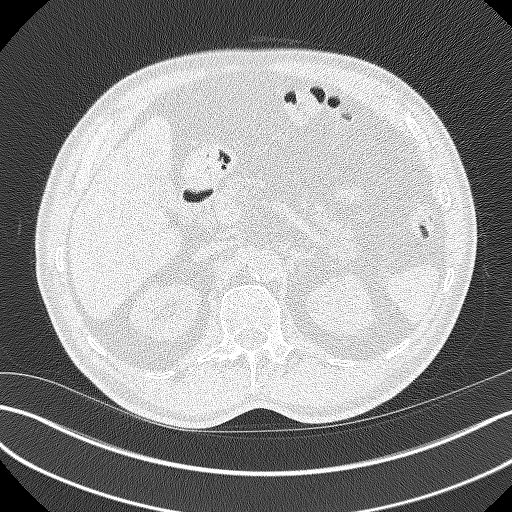
[im 44/321  lung]
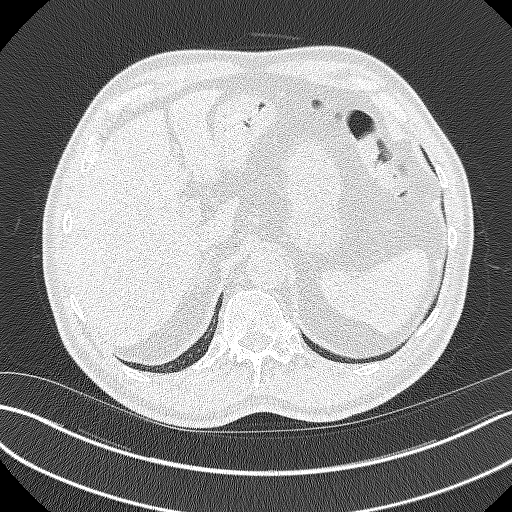
[im 73/321  lung]
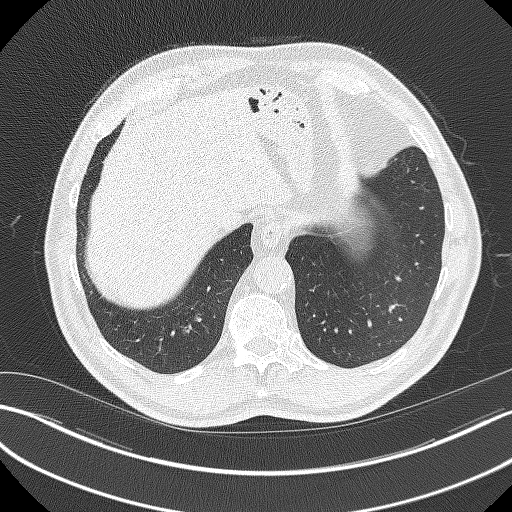
[im 102/321  lung]
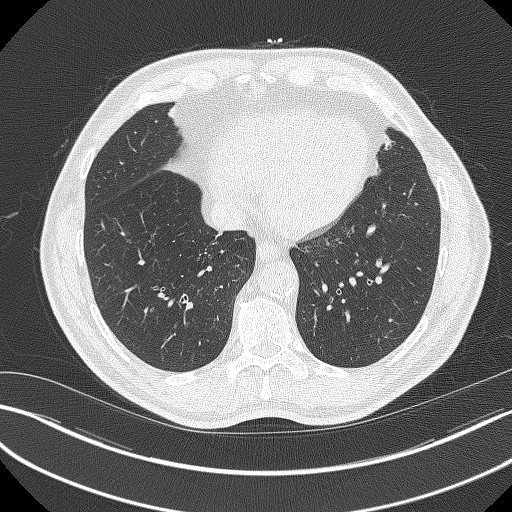
[im 117/321  mediastinal]
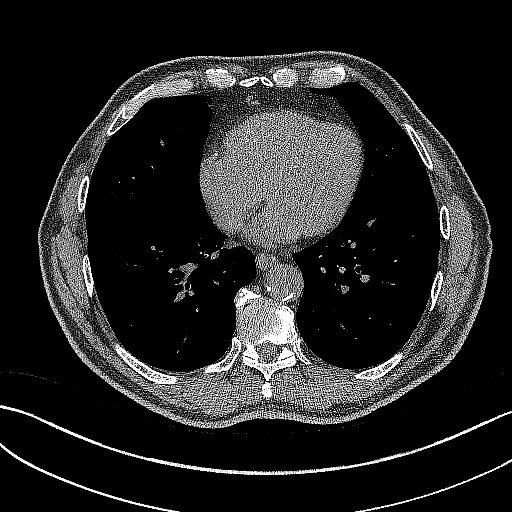
[im 117/321  lung]
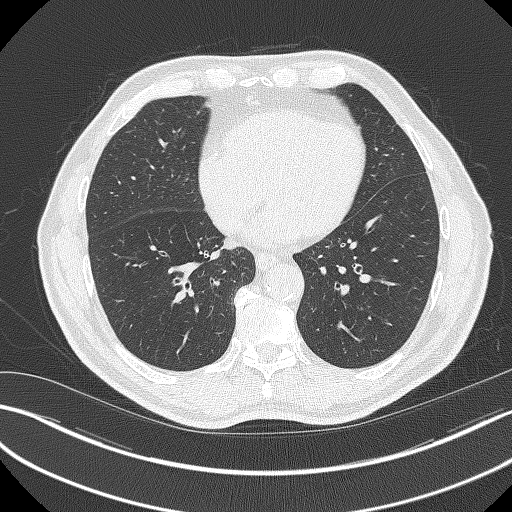
[im 146/321  lung]
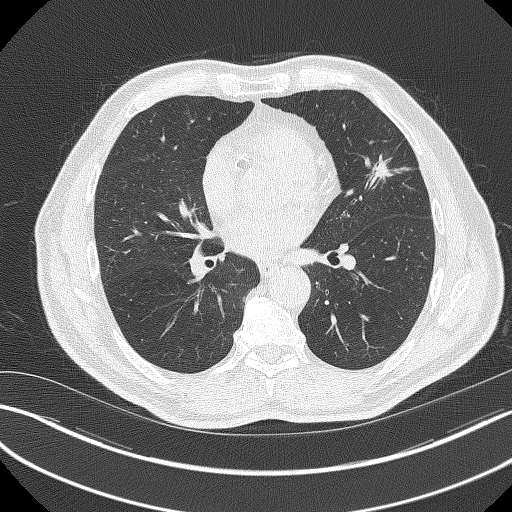
[im 175/321  lung]
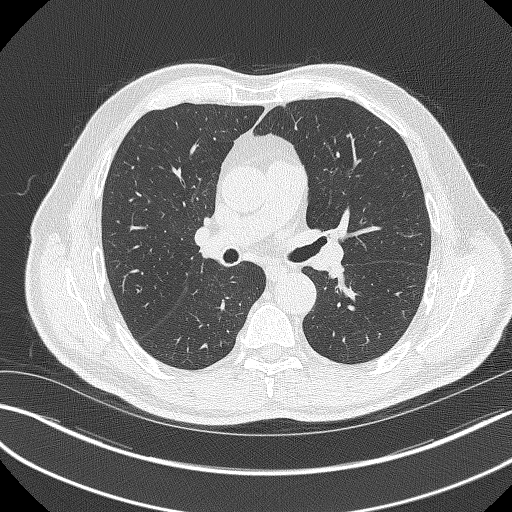
[im 204/321  lung]
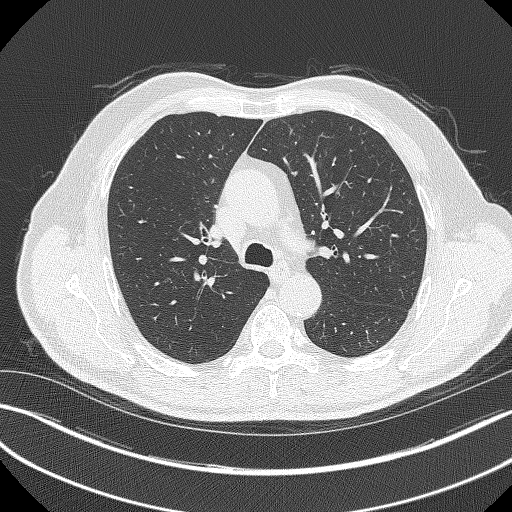
[im 219/321  mediastinal]
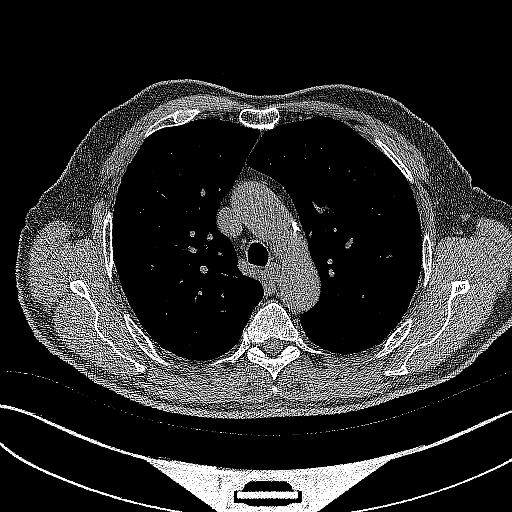
[im 219/321  lung]
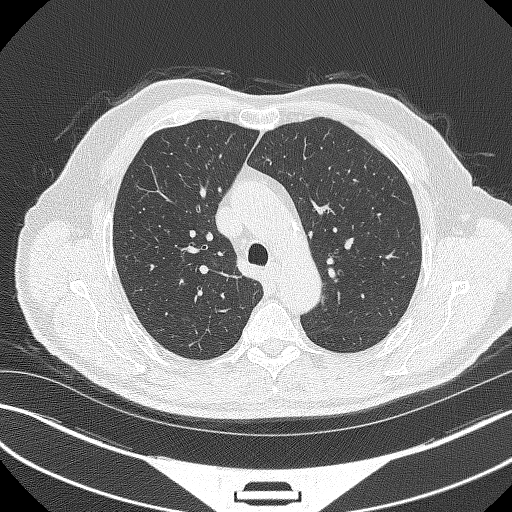
[im 248/321  lung]
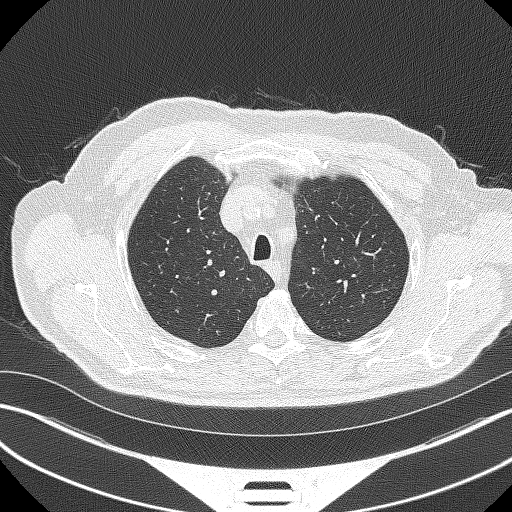
[im 277/321  lung]
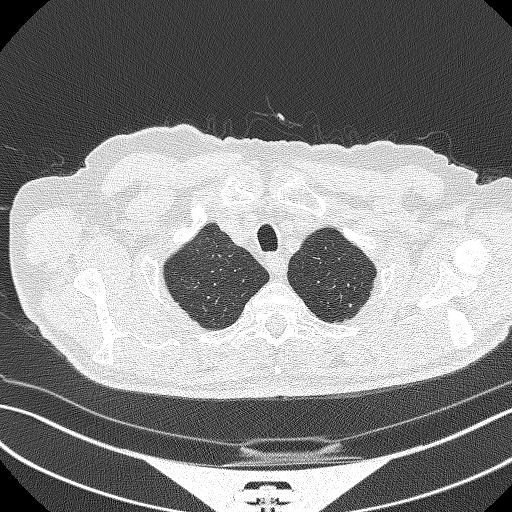
[im 306/321  lung]
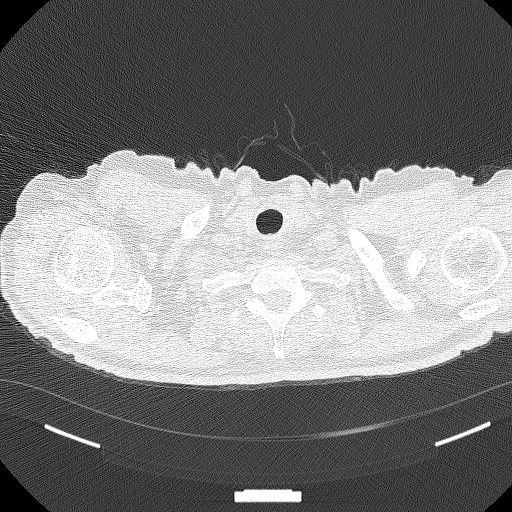

[15 of 40 positions shown; findings below may reference images not displayed]

FINDINGS: Cardiovascular: Normal heart size. No significant pericardial
effusion/thickening. Three-vessel coronary atherosclerosis.
Atherosclerotic nonaneurysmal thoracic aorta. Normal caliber
pulmonary arteries.

Mediastinum/Nodes: No discrete thyroid nodules. Unremarkable
esophagus. No pathologically enlarged axillary, mediastinal or hilar
lymph nodes, noting limited sensitivity for the detection of hilar
adenopathy on this noncontrast study.

Lungs/Pleura: No pneumothorax. No pleural effusion. Mild
centrilobular emphysema with diffuse bronchial wall thickening. No
acute consolidative airspace disease or lung masses. Several solid
pulmonary nodules throughout both lungs. The dominant pulmonary
nodule is a spiculated solid left upper lobe pulmonary nodule
measuring 15.1 mm in volume derived mean diameter (series 9/image
171).

Upper abdomen: No acute abnormality.

Musculoskeletal: No aggressive appearing focal osseous lesions.
Moderate thoracic spondylosis.
IMPRESSION: 1. Lung-RADS 4B, suspicious. Dominant spiculated solid 15.1 mm left
upper lobe pulmonary nodule, highly suspicious for primary
bronchogenic carcinoma. Additional imaging evaluation or
consultation with Pulmonology or Thoracic Surgery recommended.
2. No thoracic adenopathy evident on this noncontrast low-dose CT.
3. Three-vessel coronary atherosclerosis.

Aortic Atherosclerosis (MC6US-3NV.V) and Emphysema (MC6US-L73.C).

These results will be called to the ordering clinician or
representative by the Radiologist Assistant, and communication
documented in the PACS or zVision Dashboard.

## 2020-10-18 ENCOUNTER — Other Ambulatory Visit: Payer: Self-pay | Admitting: Cardiothoracic Surgery

## 2020-10-18 DIAGNOSIS — C3492 Malignant neoplasm of unspecified part of left bronchus or lung: Secondary | ICD-10-CM

## 2020-10-19 ENCOUNTER — Other Ambulatory Visit: Payer: Self-pay | Admitting: Cardiothoracic Surgery

## 2020-10-19 DIAGNOSIS — C3490 Malignant neoplasm of unspecified part of unspecified bronchus or lung: Secondary | ICD-10-CM

## 2020-10-19 NOTE — Progress Notes (Unsigned)
ct 

## 2020-11-19 DIAGNOSIS — E7800 Pure hypercholesterolemia, unspecified: Secondary | ICD-10-CM | POA: Diagnosis not present

## 2020-11-19 DIAGNOSIS — Z125 Encounter for screening for malignant neoplasm of prostate: Secondary | ICD-10-CM | POA: Diagnosis not present

## 2020-11-19 DIAGNOSIS — N1831 Chronic kidney disease, stage 3a: Secondary | ICD-10-CM | POA: Diagnosis not present

## 2020-11-19 DIAGNOSIS — I129 Hypertensive chronic kidney disease with stage 1 through stage 4 chronic kidney disease, or unspecified chronic kidney disease: Secondary | ICD-10-CM | POA: Diagnosis not present

## 2020-11-19 DIAGNOSIS — J449 Chronic obstructive pulmonary disease, unspecified: Secondary | ICD-10-CM | POA: Diagnosis not present

## 2020-11-19 DIAGNOSIS — R7303 Prediabetes: Secondary | ICD-10-CM | POA: Diagnosis not present

## 2020-11-19 DIAGNOSIS — K219 Gastro-esophageal reflux disease without esophagitis: Secondary | ICD-10-CM | POA: Diagnosis not present

## 2020-11-19 DIAGNOSIS — Z8719 Personal history of other diseases of the digestive system: Secondary | ICD-10-CM | POA: Diagnosis not present

## 2020-11-26 ENCOUNTER — Other Ambulatory Visit: Payer: Self-pay

## 2020-11-26 ENCOUNTER — Ambulatory Visit
Admission: RE | Admit: 2020-11-26 | Discharge: 2020-11-26 | Disposition: A | Discharge status: Home / Self Care | Payer: PPO | Source: Ambulatory Visit | Attending: Cardiothoracic Surgery | Admitting: Cardiothoracic Surgery

## 2020-11-26 ENCOUNTER — Ambulatory Visit: Payer: PPO | Admitting: Cardiothoracic Surgery

## 2020-11-26 VITALS — BP 129/63 | HR 51 | Temp 97.8°F | Resp 20 | Ht 68.0 in | Wt 174.0 lb

## 2020-11-26 DIAGNOSIS — C3492 Malignant neoplasm of unspecified part of left bronchus or lung: Secondary | ICD-10-CM | POA: Diagnosis not present

## 2020-11-26 DIAGNOSIS — C3490 Malignant neoplasm of unspecified part of unspecified bronchus or lung: Secondary | ICD-10-CM

## 2020-11-26 DIAGNOSIS — R918 Other nonspecific abnormal finding of lung field: Secondary | ICD-10-CM | POA: Diagnosis not present

## 2020-11-26 NOTE — Progress Notes (Signed)
[address]       [place] [address]             [phone]      Jack Taylor [place] Medical Record #[id number] Date of Birth: Apr 04, 1946  Referring: Curt Bears, MD Primary Care: [name], MD Primary Cardiologist: No primary care provider on file.   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 SURGICAL PROCEDURE:  Left video-assisted thoracoscopy, left lingulectomy, lymph node dissection, and intercostal nerve block.  Cancer Staging Adenocarcinoma of left lung, stage 3 (HCC) Staging form: Lung, AJCC 8th Edition - Clinical: Stage IIIA (cT1b, cN2, cM0) - Signed by Curt Bears, MD on 12/27/2018 - Pathologic stage from 01/03/2019: Stage IA3 (pT1c, pN0, cM0) - Signed by Grace Isaac, MD on 01/03/2019   History of Present Illness:     Patient returns to the office today in follow-up after lung resection done in January 2020. 2 years ago  final path showed non-small cell carcinoma stage IA3   On review multidisciplinary thoracic oncology conference note additional treatment other than observation was recommended.  The patient continues to be active working in his garden without symptoms.  He is remained off cigarettes since his surgery   Patient remains active outside doing physical work.   Past Medical History:  Diagnosis Date  . Adenomatous colon polyp   . Anxiety   . Blood transfusion without reported diagnosis   . Cancer ([place])    Lung; 11/2018  . CKD (chronic kidney disease)    stage III 11/09/18  . COPD (chronic obstructive pulmonary disease) ([place])   . Gastric ulcer   . GERD (gastroesophageal reflux disease)   . High cholesterol   . Hypertension   . Pre-diabetes    A1c 6.2% 11/09/18     Social History   Tobacco Use  Smoking Status Former  Smoker  . Packs/day: 0.25  . Years: 5.00  . Pack years: 1.25  . Types: Cigarettes  . Quit date: 11/04/2018  . Years since quitting: 2.0  Smokeless Tobacco Never Used  Tobacco Comment   recently quit when diagnosed with lung cancer.     Social History   Substance and Sexual Activity  Alcohol Use No     No Known Allergies  Current Outpatient Medications  Medication Sig Dispense Refill  . albuterol (PROVENTIL HFA;VENTOLIN HFA) 108 (90 Base) MCG/ACT inhaler Inhale 2 puffs into the lungs every 6 (six) hours as needed for wheezing or shortness of breath. Reported on 06/02/2016    . amLODipine (NORVASC) 10 MG tablet Take 10 mg by mouth daily.     Marland Kitchen esomeprazole (NEXIUM) 40 MG capsule Take 40 mg by mouth daily.     Marland Kitchen losartan (COZAAR) 50 MG tablet Take 50 mg by mouth daily.     . rosuvastatin (CRESTOR) 10 MG tablet Take 10 mg by mouth daily.    . Tiotropium Bromide-Olodaterol 2.5-2.5 MCG/ACT AERS Inhale 1 puff into the lungs daily.     No current facility-administered medications for this visit.       Physical Exam: BP 129/63   Pulse (!) 51   Temp 97.8 F (36.6 C) (Skin)   Resp 20   Ht 5\' 8"  (1.727 m)   Wt 174 lb (78.9 kg)   SpO2 95% Comment: RA  BMI  26.46 kg/m  General appearance: alert, cooperative and no distress Lymph nodes: Cervical, supraclavicular, and axillary nodes normal. Resp: clear to auscultation bilaterally Cardio: regular rate and rhythm, S1, S2 normal, no murmur, click, rub or gallop Extremities: extremities normal, atraumatic, no cyanosis or edema Neurologic: Grossly normal Patient's incisions are well-healed He has no palpable cervical or supraclavicular or axillary lymph nodes  Diagnostic Studies & Laboratory data:     Recent Radiology Findings:  CT CHEST WO CONTRAST  Result Date: 11/26/2020 CLINICAL DATA:  Small cell lung cancer.  LEFT lung resection. EXAM: CT CHEST WITHOUT CONTRAST TECHNIQUE: Multidetector CT imaging of the chest was performed  following the standard protocol without IV contrast. COMPARISON:  CT 05/21/2020 FINDINGS: Cardiovascular: Coronary artery calcification and aortic atherosclerotic calcification. Mediastinum/Nodes: Small mediastinal lymph nodes measuring less than 10 mm are unchanged. No new adenopathy. No supraclavicular adenopathy. No axillary adenopathy Lungs/Pleura: Postsurgical change in the LEFT upper lobe. Thickening along the resection margin is similar measuring 11 mm (image 78/8) compared to 11 mm. No new nodularity is present. Within the inferior LEFT lower lobe peripheral nodule measuring 5 mm (image 129/8) compares to 4 mm on prior. Lesion measured 5 mm on CT 05/30/2019. Linear the thickening in the RIGHT upper lobe (image 53/8) is also unchanged. Upper Abdomen: Limited view of the liver, kidneys, pancreas are unremarkable. Normal adrenal glands. The several periportal lymph nodes unchanged including 12 mm node image 146/2. Musculoskeletal: No aggressive osseous lesion. IMPRESSION: 1. Stable postsurgical change in the LEFT upper lobe. No evidence of local recurrence. 2. Stable small pulmonary nodules in LEFT and RIGHT lung. 3. No new or progressive nodularity. 4. Stable small mediastinal lymph nodes. 5. Three-vessel coronary artery calcification and Aortic Atherosclerosis (ICD10-I70.0). Electronically Signed   By: Suzy Bouchard M.D.   On: 11/26/2020 09:53   I have independently reviewed the above radiology studies  and reviewed the findings with the patient.  CT CHEST WO CONTRAST  Result Date: 05/21/2020 CLINICAL DATA:  74 year old male with history of pulmonary nodule. EXAM: CT CHEST WITHOUT CONTRAST TECHNIQUE: Multidetector CT imaging of the chest was performed following the standard protocol without IV contrast. COMPARISON:  Chest CT 11/14/2019. FINDINGS: Cardiovascular: Heart size is normal. There is no significant pericardial fluid, thickening or pericardial calcification. There is aortic atherosclerosis,  as well as atherosclerosis of the great vessels of the mediastinum and the coronary arteries, including calcified atherosclerotic plaque in the left main, left anterior descending, left circumflex and right coronary arteries. Mediastinum/Nodes: No pathologically enlarged mediastinal or hilar lymph nodes. Please note that accurate exclusion of hilar adenopathy is limited on noncontrast CT scans. Esophagus is unremarkable in appearance. No axillary lymphadenopathy. Lungs/Pleura: Postoperative changes of wedge resection are again noted in the posterior aspect of the left upper lobe where there is some mild chronic scarring and architectural distortion. Several small pulmonary nodules are again noted in the lungs, largest of which measure only 4 mm (axial image 42 of series 8), either stable or decreased in size compared to the prior examination. No other larger more suspicious appearing pulmonary nodules or masses are noted. No acute consolidative airspace disease. No pleural effusions. Upper Abdomen: Aortic atherosclerosis. Musculoskeletal: There are no aggressive appearing lytic or blastic lesions noted in the visualized portions of the skeleton. IMPRESSION: 1. All previously noted pulmonary nodules are stable to decreased in size compared to the prior examination, considered benign. No other new suspicious appearing pulmonary nodules or masses are noted. 2. Aortic atherosclerosis, in addition to left  main and 3 vessel coronary artery disease. Assessment for potential risk factor modification, dietary therapy or pharmacologic therapy may be warranted, if clinically indicated. Aortic Atherosclerosis (ICD10-I70.0). Electronically Signed   By: Vinnie Langton M.D.   On: 05/21/2020 15:47    CT CHEST WO CONTRAST  Result Date: 11/14/2019 CLINICAL DATA:  74 year old male with history of metastatic non-small cell lung cancer. EXAM: CT CHEST WITHOUT CONTRAST TECHNIQUE: Multidetector CT imaging of the chest was performed  following the standard protocol without IV contrast. COMPARISON:  Chest CT 05/30/2019. FINDINGS: Cardiovascular: Heart size is normal. There is no significant pericardial fluid, thickening or pericardial calcification. There is aortic atherosclerosis, as well as atherosclerosis of the great vessels of the mediastinum and the coronary arteries, including calcified atherosclerotic plaque in the left main, left anterior descending, left circumflex and right coronary arteries. Mediastinum/Nodes: No pathologically enlarged mediastinal or hilar lymph nodes. Please note that accurate exclusion of hilar adenopathy is limited on noncontrast CT scans. Esophagus is unremarkable in appearance. No axillary lymphadenopathy. Lungs/Pleura: Postoperative changes of wedge resection are again noted in the posterior aspect of the left upper lobe abutting the major fissure. No unexpected soft tissue mass in this region to suggest locally recurrent disease. There continues to be several small pulmonary nodules scattered throughout the lungs bilaterally which are stable in number and size compared to the prior study, largest of which measures 6 x 3 mm (axial image 50 of series 8) in the medial aspect of the right upper lobe. No other larger more suspicious appearing pulmonary nodules or masses are noted. No acute consolidative airspace disease. No pleural effusions. Mild scarring in the right lung base. Upper Abdomen: Aortic atherosclerosis. Musculoskeletal: There are no aggressive appearing lytic or blastic lesions noted in the visualized portions of the skeleton. IMPRESSION: 1. Stable appearance of the chest with postoperative changes of wedge resection in the posterior aspect of the left upper lobe, and multiple small pulmonary nodules in the lungs bilaterally which are unchanged and favored to be benign. No definite findings to suggest locally recurrent disease or metastatic disease. 2. Aortic atherosclerosis, in addition to left main  and 3 vessel coronary artery disease. Assessment for potential risk factor modification, dietary therapy or pharmacologic therapy may be warranted, if clinically indicated. Aortic Atherosclerosis (ICD10-I70.0). Electronically Signed   By: Vinnie Langton M.D.   On: 11/14/2019 10:26        Recent Lab Findings: Lab Results  Component Value Date   WBC 6.8 01/06/2019   HGB 11.0 (L) 01/06/2019   HCT 32.4 (L) 01/06/2019   PLT 129 (L) 01/06/2019   GLUCOSE 110 (H) 01/06/2019   ALT 22 01/03/2019   AST 33 01/03/2019   NA 141 01/06/2019   K 3.7 01/06/2019   CL 103 01/06/2019   CREATININE 1.15 01/06/2019   BUN 17 01/06/2019   CO2 28 01/06/2019   INR 1.00 12/31/2018      Assessment / Plan:    Patient status post lingulectomy for non-small cell stage Ia lung cancer.  Stage IA3.   Return one year ct chest  Grace Isaac MD      [address] [place], [address] Office [phone]   Beeper [phone]  11/26/2020 12:11 PM

## 2021-05-26 DIAGNOSIS — I129 Hypertensive chronic kidney disease with stage 1 through stage 4 chronic kidney disease, or unspecified chronic kidney disease: Secondary | ICD-10-CM | POA: Diagnosis not present

## 2021-05-26 DIAGNOSIS — Z0000 Encounter for general adult medical examination without abnormal findings: Secondary | ICD-10-CM | POA: Diagnosis not present

## 2021-05-26 DIAGNOSIS — N1831 Chronic kidney disease, stage 3a: Secondary | ICD-10-CM | POA: Diagnosis not present

## 2021-05-26 DIAGNOSIS — J449 Chronic obstructive pulmonary disease, unspecified: Secondary | ICD-10-CM | POA: Diagnosis not present

## 2021-05-26 DIAGNOSIS — Z8719 Personal history of other diseases of the digestive system: Secondary | ICD-10-CM | POA: Diagnosis not present

## 2021-05-26 DIAGNOSIS — K219 Gastro-esophageal reflux disease without esophagitis: Secondary | ICD-10-CM | POA: Diagnosis not present

## 2021-05-26 DIAGNOSIS — E7800 Pure hypercholesterolemia, unspecified: Secondary | ICD-10-CM | POA: Diagnosis not present

## 2021-05-26 DIAGNOSIS — R7303 Prediabetes: Secondary | ICD-10-CM | POA: Diagnosis not present

## 2021-05-26 DIAGNOSIS — Z85118 Personal history of other malignant neoplasm of bronchus and lung: Secondary | ICD-10-CM | POA: Diagnosis not present

## 2021-05-31 ENCOUNTER — Ambulatory Visit
Admission: RE | Admit: 2021-05-31 | Discharge: 2021-05-31 | Disposition: A | Discharge status: Home / Self Care | Payer: PPO | Source: Ambulatory Visit | Attending: Family Medicine | Admitting: Family Medicine

## 2021-05-31 ENCOUNTER — Other Ambulatory Visit: Payer: Self-pay | Admitting: Family Medicine

## 2021-05-31 DIAGNOSIS — J449 Chronic obstructive pulmonary disease, unspecified: Secondary | ICD-10-CM

## 2021-05-31 DIAGNOSIS — J984 Other disorders of lung: Secondary | ICD-10-CM | POA: Diagnosis not present

## 2021-05-31 DIAGNOSIS — Z85118 Personal history of other malignant neoplasm of bronchus and lung: Secondary | ICD-10-CM

## 2021-05-31 DIAGNOSIS — Z9889 Other specified postprocedural states: Secondary | ICD-10-CM | POA: Diagnosis not present

## 2021-10-18 ENCOUNTER — Other Ambulatory Visit: Payer: Self-pay | Admitting: Thoracic Surgery (Cardiothoracic Vascular Surgery)

## 2021-10-18 DIAGNOSIS — C3492 Malignant neoplasm of unspecified part of left bronchus or lung: Secondary | ICD-10-CM

## 2021-10-18 NOTE — Progress Notes (Unsigned)
EST

## 2021-11-19 ENCOUNTER — Ambulatory Visit: Payer: PPO | Admitting: Thoracic Surgery (Cardiothoracic Vascular Surgery)

## 2021-11-19 ENCOUNTER — Other Ambulatory Visit: Payer: Self-pay

## 2021-11-19 ENCOUNTER — Other Ambulatory Visit: Payer: Self-pay | Admitting: Thoracic Surgery (Cardiothoracic Vascular Surgery)

## 2021-11-19 ENCOUNTER — Ambulatory Visit
Admission: RE | Admit: 2021-11-19 | Discharge: 2021-11-19 | Disposition: A | Discharge status: Home / Self Care | Payer: PPO | Source: Ambulatory Visit | Attending: Thoracic Surgery (Cardiothoracic Vascular Surgery) | Admitting: Thoracic Surgery (Cardiothoracic Vascular Surgery)

## 2021-11-19 VITALS — BP 112/57 | HR 48 | Resp 20 | Ht 68.0 in | Wt 173.0 lb

## 2021-11-19 DIAGNOSIS — C3492 Malignant neoplasm of unspecified part of left bronchus or lung: Secondary | ICD-10-CM | POA: Diagnosis not present

## 2021-11-19 DIAGNOSIS — I700 Atherosclerosis of aorta: Secondary | ICD-10-CM | POA: Diagnosis not present

## 2021-11-19 DIAGNOSIS — J929 Pleural plaque without asbestos: Secondary | ICD-10-CM | POA: Diagnosis not present

## 2021-11-19 DIAGNOSIS — I2510 Atherosclerotic heart disease of native coronary artery without angina pectoris: Secondary | ICD-10-CM | POA: Diagnosis not present

## 2021-11-22 NOTE — Progress Notes (Signed)
° °   °  [address]       [place] [address]             [phone]        Jack Taylor [place] Medical Record #[id number] Date of Birth: [birth year]  Referring: Jack Bears, MD Primary Care: Jack Major, MD Primary Cardiologist:None  Reason for visit:   follow-up  History of Present Illness:     75 year old male who is a former patient of Jack Taylor.  He underwent a left lingulectomy with lymph node dissection in 2020 he was a stage IIIa, T1 N2 M0.  He is currently followed by Dr. Julien Taylor he comes in today for his annual surveillance CT scan  Physical Exam: BP (!) 112/57    Pulse (!) 48    Resp 20    Ht 5\' 8"  (1.727 m)    Wt 173 lb (78.5 kg)    SpO2 96% Comment: RA   BMI 26.30 kg/m   Alert NAD Abdomen  ND no peripheral edema   Diagnostic Studies & Laboratory data: CT chest: Mediastinum/Nodes: AP window node measures 12 mm (image 55/2 which is increased from 9 mm on comparison exam. RIGHT lower paratracheal node measuring 6 mm unchanged.   Lungs/Pleura:   Pleuroparenchymal thickening along the LEFT oblique fissure is unchanged (image 65/8). Surgical margin in the lingular lobe is unchanged without evidence of nodularity (image 73/8)   Ground-glass nodule in the LEFT lower lobe measuring 5 mm (image 70 4/8) is unchanged.   Assessment / Plan:   75 year old male with history of stage IIIa adenocarcinoma of the lingula.  He is status post lingulectomy in 2020.  His cross-sectional imaging was reviewed today.  He does have an AP window lymph node that is increased in size a 12 mm.  Recommended that he undergo a PET/CT.  The case was discussed with Dr. Julien Taylor and.  This would be a very difficult area to biopsy.   Jack Taylor O Jack Taylor 11/22/2021 1:25 PM

## 2021-11-24 DIAGNOSIS — R7303 Prediabetes: Secondary | ICD-10-CM | POA: Diagnosis not present

## 2021-11-24 DIAGNOSIS — N1831 Chronic kidney disease, stage 3a: Secondary | ICD-10-CM | POA: Diagnosis not present

## 2021-11-24 DIAGNOSIS — F5104 Psychophysiologic insomnia: Secondary | ICD-10-CM | POA: Diagnosis not present

## 2021-11-24 DIAGNOSIS — Z125 Encounter for screening for malignant neoplasm of prostate: Secondary | ICD-10-CM | POA: Diagnosis not present

## 2021-11-24 DIAGNOSIS — E7800 Pure hypercholesterolemia, unspecified: Secondary | ICD-10-CM | POA: Diagnosis not present

## 2021-12-02 ENCOUNTER — Other Ambulatory Visit: Payer: Self-pay

## 2021-12-02 ENCOUNTER — Ambulatory Visit (HOSPITAL_COMMUNITY)
Admission: RE | Admit: 2021-12-02 | Discharge: 2021-12-02 | Disposition: A | Discharge status: Home / Self Care | Payer: PPO | Source: Ambulatory Visit | Attending: Thoracic Surgery (Cardiothoracic Vascular Surgery) | Admitting: Thoracic Surgery (Cardiothoracic Vascular Surgery)

## 2021-12-02 DIAGNOSIS — C3492 Malignant neoplasm of unspecified part of left bronchus or lung: Secondary | ICD-10-CM | POA: Diagnosis not present

## 2021-12-02 LAB — GLUCOSE, CAPILLARY: Glucose-Capillary: 80 mg/dL (ref 70–99)

## 2021-12-02 MED ORDER — FLUDEOXYGLUCOSE F - 18 (FDG) INJECTION
8.5000 | Freq: Once | INTRAVENOUS | Status: AC
  Administered 2021-12-02: 10:00:00 8.68 via INTRAVENOUS

## 2021-12-03 ENCOUNTER — Ambulatory Visit (INDEPENDENT_AMBULATORY_CARE_PROVIDER_SITE_OTHER): Payer: PPO | Admitting: Thoracic Surgery (Cardiothoracic Vascular Surgery)

## 2021-12-03 ENCOUNTER — Encounter: Payer: Self-pay | Admitting: *Deleted

## 2021-12-03 DIAGNOSIS — C3492 Malignant neoplasm of unspecified part of left bronchus or lung: Secondary | ICD-10-CM

## 2021-12-03 NOTE — Progress Notes (Signed)
Oncology Nurse Navigator Documentation  Oncology Nurse Navigator Flowsheets 12/03/2021 12/19/2018 12/19/2018  Confirmed Diagnosis Date - - 12/18/2018  Navigator Follow Up Date: 12/07/2021 - -  Navigator Follow Up Reason: Appointment Review - -  Navigator Location [facility]  Referral Date to RadOnc/MedOnc - - 12/18/2018  Navigator Encounter Type Other:/I received referral on Mr. Mader.  He was seen by Dr. Julien Nordmann in 2020.  I completed a scheduling message for patient to be called with an appt on 1/4 at 8:15.  Telephone Telephone  Telephone - Incoming Call;Outgoing Call Outgoing Call  Patient Visit Type Other - -  Treatment Phase Pre-Tx/Tx Discussion Pre-Tx/Tx Discussion Pre-Tx/Tx Discussion  Barriers/Navigation Needs Coordination of Care Education;Coordination of Care Coordination of Care  Education - Other -  Interventions Coordination of Care Coordination of Care;Education Coordination of Care  Acuity Level 2-Minimal Needs (1-2 Barriers Identified) Level 1 Level 1  Coordination of Care - Appts Other  Education Method - Verbal -  Time Spent with Patient 30 15 15

## 2021-12-03 NOTE — Progress Notes (Signed)
°   °  [address]       [place] [address]             [phone]       Patient: Home Provider: Office Consent for Telemedicine visit obtained.  Todays visit was completed via a real-time telehealth (see specific modality noted below). The patient/authorized person provided oral consent at the time of the visit to engage in a telemedicine encounter with the present provider at [facility]. The patient/authorized person was informed of the potential benefits, limitations, and risks of telemedicine. The patient/authorized person expressed understanding that the laws that protect confidentiality also apply to telemedicine. The patient/authorized person acknowledged understanding that telemedicine does not provide emergency services and that he or she would need to call 911 or proceed to the nearest hospital for help if such a need arose.   Total time spent in the clinical discussion 10 minutes.  Telehealth Modality: Phone visit (audio only)  I had a telephone visit with Jack Taylor.  He underwent a lingulectomy by Dr. Servando Snare in 2020 for a stage IIIa non-small cell lung cancer.  On most recent surveillance imaging he was noted to have an increased size in his AP window lymph node.  PET/CT was performed which showed avidity in the node.  This is concerning for recurrent disease.  I explained this to Jack Taylor and his wife, and made a referral for him to meet with our medical oncologists.  Avaiyah Strubel Bary Leriche

## 2021-12-08 ENCOUNTER — Encounter: Payer: Self-pay | Admitting: Internal Medicine

## 2021-12-08 ENCOUNTER — Other Ambulatory Visit: Payer: Self-pay

## 2021-12-08 ENCOUNTER — Encounter: Payer: Self-pay | Admitting: *Deleted

## 2021-12-08 ENCOUNTER — Telehealth: Payer: Self-pay

## 2021-12-08 ENCOUNTER — Inpatient Hospital Stay: Payer: PPO | Attending: Internal Medicine | Admitting: Internal Medicine

## 2021-12-08 VITALS — BP 131/66 | HR 51 | Temp 97.6°F | Resp 19 | Ht 68.0 in | Wt 174.9 lb

## 2021-12-08 DIAGNOSIS — I10 Essential (primary) hypertension: Secondary | ICD-10-CM | POA: Insufficient documentation

## 2021-12-08 DIAGNOSIS — I6523 Occlusion and stenosis of bilateral carotid arteries: Secondary | ICD-10-CM | POA: Insufficient documentation

## 2021-12-08 DIAGNOSIS — C3492 Malignant neoplasm of unspecified part of left bronchus or lung: Secondary | ICD-10-CM | POA: Diagnosis not present

## 2021-12-08 DIAGNOSIS — I700 Atherosclerosis of aorta: Secondary | ICD-10-CM | POA: Diagnosis not present

## 2021-12-08 DIAGNOSIS — R091 Pleurisy: Secondary | ICD-10-CM | POA: Insufficient documentation

## 2021-12-08 DIAGNOSIS — C3412 Malignant neoplasm of upper lobe, left bronchus or lung: Secondary | ICD-10-CM | POA: Diagnosis not present

## 2021-12-08 DIAGNOSIS — R079 Chest pain, unspecified: Secondary | ICD-10-CM | POA: Insufficient documentation

## 2021-12-08 DIAGNOSIS — Z79899 Other long term (current) drug therapy: Secondary | ICD-10-CM | POA: Insufficient documentation

## 2021-12-08 DIAGNOSIS — J449 Chronic obstructive pulmonary disease, unspecified: Secondary | ICD-10-CM | POA: Insufficient documentation

## 2021-12-08 DIAGNOSIS — R5383 Other fatigue: Secondary | ICD-10-CM | POA: Diagnosis not present

## 2021-12-08 NOTE — Telephone Encounter (Signed)
Staff message received on 12/08/21   Collene Gobble, MD  Dierdre Highman, RN Needs OV to discuss bronchoscopy  Thanks. RB        Previous Messages   ----- Message -----  From: Valrie Hart, RN  Sent: 12/08/2021   9:03 AM EST  To: Collene Gobble, MD  Subject: re-referral                                     Hey Dr. Lamonte Sakai-  Dr. Julien Nordmann saw Mr. Koelling today and we need tissue bx. He has history of stage III lung cancer s/p resection.  Recent PET showed hypermetabolic nodes.  You saw him last in 2017.  Let me know if you have more questions, thanks D    ATC LVMTCB. If pt calls back please schedule in 3pm nodule slot on 12/09/21

## 2021-12-08 NOTE — Progress Notes (Signed)
[place] [phone]   [phone]  OFFICE PROGRESS NOTE  Jack Major, MD [address] [place] [address]  DIAGNOSIS: stage IA (T1b, N0, M0) non-small cell lung cancer, adenocarcinoma presented with left upper lobe lung nodule diagnosed in January 2020.  The patient has evidence for disease recurrence in AP window lymphadenopathy in December 2022.  PRIOR THERAPY: Status post left lingulectomy with lymph node dissection under the care of Dr. Servando Snare on January 01, 2019  CURRENT THERAPY: Observation  INTERVAL HISTORY: Jack Taylor 76 y.o. male returns to the clinic today for follow-up visit accompanied by his daughter.  The patient is feeling fine today with no concerning complaints except for occasional pain and numbness on the left side of the chest from the surgical scar.  He was diagnosed with a stage Ia non-small cell lung cancer, adenocarcinoma with no evidence of metastatic disease to the dissected lymph nodes in January 2020 under the care of Dr. Servando Snare.  He was followed by Dr. Servando Snare by observation since that time.  The patient was seen recently by Dr. Kipp Brood and repeat CT scan of the chest on November 19, 2021 showed increase in size of AP window lymph node concerning for disease recurrence.  A PET scan was performed on December 02, 2021 and that showed the enlarging AP window lymph node was hypermetabolic consistent with metastatic disease.  There was no other findings of metastatic disease identified and no suspicious pulmonary nodularities or abnormal metabolic activities.  MEDICAL HISTORY: Past Medical History:  Diagnosis Date   Adenomatous colon polyp    Anxiety    Blood transfusion without reported diagnosis    Cancer ([place])    Lung; 11/2018   CKD (chronic kidney disease)    stage III 11/09/18   COPD (chronic obstructive pulmonary disease) (HCC)    Gastric ulcer    GERD (gastroesophageal reflux disease)     High cholesterol    Hypertension    Pre-diabetes    A1c 6.2% 11/09/18    ALLERGIES:  has No Known Allergies.  MEDICATIONS:  Current Outpatient Medications  Medication Sig Dispense Refill   albuterol (PROVENTIL HFA;VENTOLIN HFA) 108 (90 Base) MCG/ACT inhaler Inhale 2 puffs into the lungs every 6 (six) hours as needed for wheezing or shortness of breath. Reported on 06/02/2016     amLODipine (NORVASC) 10 MG tablet Take 10 mg by mouth daily.      esomeprazole (NEXIUM) 40 MG capsule Take 40 mg by mouth daily.      losartan (COZAAR) 50 MG tablet Take 50 mg by mouth daily.      rosuvastatin (CRESTOR) 10 MG tablet Take 10 mg by mouth daily.     Tiotropium Bromide-Olodaterol 2.5-2.5 MCG/ACT AERS Inhale 1 puff into the lungs daily.     No current facility-administered medications for this visit.    SURGICAL HISTORY:  Past Surgical History:  Procedure Laterality Date   COLONOSCOPY  2008   ESOPHAGOGASTRODUODENOSCOPY  11/23/2011   Procedure: ESOPHAGOGASTRODUODENOSCOPY (EGD);  Surgeon: Owens Loffler, MD;  Location: [facility];  Service: Endoscopy;  Laterality: N/A;   INTERCOSTAL NERVE BLOCK Left 01/01/2019   Procedure: INTERCOSTAL NERVE BLOCK;  Surgeon: Grace Isaac, MD;  Location: [place];  Service: Thoracic;  Laterality: Left;   LUNG BIOPSY  12/18/2018   Procedure: LUNG BIOPSY - of Left Upper Lobe Biopsy of #7 Node Biopsy of #10L Node;  Surgeon: Grace Isaac, MD;  Location: [facility]  OR;  Service: Thoracic;;   LYMPH NODE DISSECTION Left 01/01/2019   Procedure: LYMPH NODE DISSECTION;  Surgeon: Grace Isaac, MD;  Location: [place];  Service: Thoracic;  Laterality: Left;   UPPER GASTROINTESTINAL ENDOSCOPY  11/23/11   VIDEO ASSISTED THORACOSCOPY (VATS)/WEDGE RESECTION Left 01/01/2019   Procedure: VIDEO ASSISTED THORACOSCOPY (VATS)/ LEFT LINGULECTOMY;  Surgeon: Grace Isaac, MD;  Location: [place];  Service: Thoracic;  Laterality: Left;   VIDEO BRONCHOSCOPY WITH ENDOBRONCHIAL NAVIGATION  N/A 12/18/2018   Procedure: VIDEO BRONCHOSCOPY WITH ENDOBRONCHIAL NAVIGATION with Placement of Fiducial Markers x 3;  Surgeon: Grace Isaac, MD;  Location: [place];  Service: Thoracic;  Laterality: N/A;   VIDEO BRONCHOSCOPY WITH ENDOBRONCHIAL ULTRASOUND N/A 12/18/2018   Procedure: VIDEO BRONCHOSCOPY WITH ENDOBRONCHIAL ULTRASOUND;  Surgeon: Grace Isaac, MD;  Location: [place];  Service: Thoracic;  Laterality: N/A;    REVIEW OF SYSTEMS:  Constitutional: positive for fatigue Eyes: negative Ears, nose, mouth, throat, and face: negative Respiratory: positive for pleurisy/chest pain Cardiovascular: negative Gastrointestinal: negative Genitourinary:negative Integument/breast: negative Hematologic/lymphatic: negative Musculoskeletal:negative Neurological: negative Behavioral/Psych: negative Endocrine: negative Allergic/Immunologic: negative   PHYSICAL EXAMINATION: General appearance: alert, cooperative, fatigued, and no distress Head: Normocephalic, without obvious abnormality, atraumatic Neck: no adenopathy, no JVD, supple, symmetrical, trachea midline, and thyroid not enlarged, symmetric, no tenderness/mass/nodules Lymph nodes: Cervical, supraclavicular, and axillary nodes normal. Resp: clear to auscultation bilaterally Back: symmetric, no curvature. ROM normal. No CVA tenderness. Cardio: regular rate and rhythm, S1, S2 normal, no murmur, click, rub or gallop GI: soft, non-tender; bowel sounds normal; no masses,  no organomegaly Extremities: extremities normal, atraumatic, no cyanosis or edema Neurologic: Alert and oriented X 3, normal strength and tone. Normal symmetric reflexes. Normal coordination and gait  ECOG PERFORMANCE STATUS: 1 - Symptomatic but completely ambulatory  Blood pressure 131/66, pulse (!) 51, temperature 97.6 F (36.4 C), temperature source Tympanic, resp. rate 19, height 5\' 8"  (1.727 m), weight 174 lb 14.4 oz (79.3 kg), SpO2 96 %.  LABORATORY DATA: Lab  Results  Component Value Date   WBC 6.8 01/06/2019   HGB 11.0 (L) 01/06/2019   HCT 32.4 (L) 01/06/2019   MCV 91.3 01/06/2019   PLT 129 (L) 01/06/2019      Chemistry      Component Value Date/Time   NA 141 01/06/2019 0300   K 3.7 01/06/2019 0300   CL 103 01/06/2019 0300   CO2 28 01/06/2019 0300   BUN 17 01/06/2019 0300   CREATININE 1.15 01/06/2019 0300   CREATININE 1.49 (H) 12/27/2018 1351      Component Value Date/Time   CALCIUM 8.5 (L) 01/06/2019 0300   ALKPHOS 44 01/03/2019 0406   AST 33 01/03/2019 0406   AST 14 (L) 12/27/2018 1351   ALT 22 01/03/2019 0406   ALT 26 12/27/2018 1351   BILITOT 0.9 01/03/2019 0406   BILITOT 0.4 12/27/2018 1351       RADIOGRAPHIC STUDIES: CT Chest Wo Contrast  Result Date: 11/19/2021 CLINICAL DATA:  Adenocarcinoma LEFT lung. EXAM: CT CHEST WITHOUT CONTRAST TECHNIQUE: Multidetector CT imaging of the chest was performed following the standard protocol without IV contrast. COMPARISON:  11/26/2020 FINDINGS: Cardiovascular: Coronary artery calcification and aortic atherosclerotic calcification. Mediastinum/Nodes: AP window node measures 12 mm (image 55/2 which is increased from 9 mm on comparison exam. RIGHT lower paratracheal node measuring 6 mm unchanged. Lungs/Pleura: Pleuroparenchymal thickening along the LEFT oblique fissure is unchanged (image 65/8). Surgical margin in the lingular lobe is unchanged without evidence of nodularity (image 73/8)  Ground-glass nodule in the LEFT lower lobe measuring 5 mm (image 70 4/8) is unchanged. Upper Abdomen: Limited view of the liver, kidneys, pancreas are unremarkable. Normal adrenal glands. Musculoskeletal: No aggressive osseous lesion. IMPRESSION: 1. Increased in size of an  AP window lymph node. 2. Stable postsurgical change in the LEFT lung. Electronically Signed   By: Suzy Bouchard M.D.   On: 11/19/2021 11:44   NM PET Image Restag (PS) Skull Base To Thigh  Result Date: 12/03/2021 CLINICAL DATA:   Subsequent treatment strategy for metastatic left lung non-small cell cancer (adenocarcinoma) with enlarging AP window lymph node on CT. EXAM: NUCLEAR MEDICINE PET SKULL BASE TO THIGH TECHNIQUE: 8.68 mCi F-18 FDG was injected intravenously. Full-ring PET imaging was performed from the skull base to thigh after the radiotracer. CT data was obtained and used for attenuation correction and anatomic localization. Fasting blood glucose: 80 mg/dl COMPARISON:  Chest CT 11/19/2021 and 11/26/2020.  PET-CT 12/11/2018. FINDINGS: Mediastinal blood pool activity: SUV max 1.4 NECK: No hypermetabolic cervical lymph nodes are identified.There are no lesions of the pharyngeal mucosal space. Incidental CT findings: Prominent bilateral carotid atherosclerosis. CHEST: The enlarging AP window lymph node on CT is hypermetabolic. This lymph node measures 10 mm short axis on image 70/4 and has an SUV max of 5.8. Low level hypermetabolic activity is noted along the posterior aspect of the left hilum (SUV max 4.0). No discrete enlarged lymph node is seen in this area, and this metabolic activity is improved from the previous study at which time it SUV max was 8.1. There is a mildly prominent right axillary node measuring 7 mm on image 67/4 with mild hypermetabolic activity (SUV max 3.3). No recurrent hypermetabolic pulmonary activity or suspicious nodularity. Incidental CT findings: Diffuse atherosclerosis of the aorta, great vessels and coronary arteries. Stable postsurgical changes in the lingula with adjacent scarring. Underlying mild centrilobular and paraseptal emphysema. ABDOMEN/PELVIS: There is no hypermetabolic activity within the liver, adrenal glands, spleen or pancreas. An area of mildly increased activity in segment 5 of the liver adjacent the gallbladder is similar to the previous study and without clear corresponding abnormality on the CT images. This has an SUV max of 4.2. No hypermetabolic abdominal or pelvic lymph nodes.  Incidental CT findings: Aortic and branch vessel atherosclerosis. No acute abdominal findings. SKELETON: There is no hypermetabolic activity to suggest osseous metastatic disease. There is prominent muscular activity posterior to the right scapula within the infraspinatus and teres minor muscles. This appears nonfocal. Incidental CT findings: none IMPRESSION: 1. The enlarging AP window lymph node is hypermetabolic, consistent with metastatic disease. 2. No other definite signs of metastatic disease are identified. No suspicious pulmonary nodularity or abnormal metabolic activity. There is a small mildly hypermetabolic right axillary lymph node associated with mild hypermetabolic activity in the adjacent shoulder musculature, favored to be posttraumatic or secondary to recent immunization. Correlate clinically. 3.  Aortic Atherosclerosis (ICD10-I70.0). Electronically Signed   By: Richardean Sale M.D.   On: 12/03/2021 09:34    ASSESSMENT AND PLAN: This is a very pleasant 76 years old white male diagnosed with stage IA (T1b, N0, M0) non-small cell lung cancer, adenocarcinoma presented with left upper lobe lung nodule in January 2020.  The patient has evidence for disease recurrence in AP window lymphadenopathy in December 2022.  He is status post left lingulectomy with lymph node dissection under the care of Dr. Servando Snare on January 01, 2019 The patient has been in observation since that time but recent CT scan  of the chest followed by a PET scan in December 2022 showed concerning findings of disease recurrence with enlarging and hypermetabolic AP window lymph node. I personally and independently reviewed the PET scan and showed the images to the patient and his daughter. I recommended for the patient to see Dr. Lamonte Sakai for consideration of bronchoscopy with EBUS and biopsy of the AP window lymph node for confirmation of tissue diagnosis.  If the patient has a confirmed disease recurrence, we will order MRI of the  brain to complete the staging work-up and then consider him for a course of concurrent chemoradiation with weekly carboplatin and paclitaxel. I will see him back for follow-up visit in 3 weeks for evaluation and more detailed discussion of his treatment options based on the final biopsy. The patient was advised to call immediately if he has any concerning symptoms in the interval. The patient voices understanding of current disease status and treatment options and is in agreement with the current care plan.  All questions were answered. The patient knows to call the clinic with any problems, questions or concerns. We can certainly see the patient much sooner if necessary. The total time spent in the appointment was 35 minutes.  Disclaimer: This note was dictated with voice recognition software. Similar sounding words can inadvertently be transcribed and may not be corrected upon review.

## 2021-12-08 NOTE — Progress Notes (Signed)
Oncology Nurse Navigator Documentation  Oncology Nurse Navigator Flowsheets 12/08/2021 12/03/2021 12/19/2018 12/19/2018  Confirmed Diagnosis Date - - - 12/18/2018  Navigator Follow Up Date: 12/10/2021 12/07/2021 - -  Navigator Follow Up Reason: Appointment Review Appointment Review - -  Navigator Location [place]  Referral Date to RadOnc/MedOnc - - - 12/18/2018  Navigator Encounter Type Clinic/MDC/I spoke with patient and family member today at clinic. Treatment plan is tissue dx then follow up with Dr. Julien Nordmann. Per Dr. Julien Nordmann, referral to pulmonary completed. I will reach out to Dr. Lamonte Sakai with an update on referral.  Other: Telephone Telephone  Telephone - - Incoming Call;Outgoing Call Outgoing Call  Patient Visit Type MedOnc Other - -  Treatment Phase Abnormal Scans Pre-Tx/Tx Discussion Pre-Tx/Tx Discussion Pre-Tx/Tx Discussion  Barriers/Navigation Needs Education;Coordination of Care Coordination of Care Education;Coordination of Care Coordination of Care  Education Other - Other -  Interventions Coordination of Care;Education;Psycho-Social Support Coordination of Care Coordination of Care;Education Coordination of Care  Acuity Level 3-Moderate Needs (3-4 Barriers Identified) Level 2-Minimal Needs (1-2 Barriers Identified) Level 1 Level 1  Coordination of Care Other - Appts Other  Education Method Verbal - Verbal -  Time Spent with Patient 45 30 15 15

## 2021-12-09 NOTE — Telephone Encounter (Signed)
ATC to call pt to set up with Dr. Lamonte Sakai consult. LVMTCB. If pt does call back please schedule in first available nodule slot.

## 2021-12-13 ENCOUNTER — Telehealth: Payer: Self-pay | Admitting: Emergency Medicine

## 2021-12-13 NOTE — Telephone Encounter (Signed)
Thank you :)

## 2021-12-13 NOTE — Telephone Encounter (Signed)
Spoke with Shirlean Mylar (wife per [facility]) and scheduled pt in nodule slot on 12/28/21 at 10:30. Shirlean Mylar stated understanding. Nothing further needed at this time.   Routing to Dr. Lamonte Sakai as Juluis Rainier

## 2021-12-20 ENCOUNTER — Telehealth: Payer: Self-pay | Admitting: Internal Medicine

## 2021-12-20 NOTE — Telephone Encounter (Signed)
Sch per 1/4 los, left msg

## 2021-12-28 ENCOUNTER — Other Ambulatory Visit: Payer: Self-pay

## 2021-12-28 ENCOUNTER — Inpatient Hospital Stay: Payer: PPO

## 2021-12-28 ENCOUNTER — Ambulatory Visit: Payer: PPO | Admitting: Emergency Medicine

## 2021-12-28 ENCOUNTER — Encounter: Payer: Self-pay | Admitting: *Deleted

## 2021-12-28 ENCOUNTER — Inpatient Hospital Stay: Payer: PPO | Admitting: Internal Medicine

## 2021-12-28 ENCOUNTER — Encounter: Payer: Self-pay | Admitting: Emergency Medicine

## 2021-12-28 ENCOUNTER — Telehealth: Payer: Self-pay | Admitting: Emergency Medicine

## 2021-12-28 VITALS — BP 132/76 | HR 47 | Temp 98.0°F | Resp 17 | Wt 177.4 lb

## 2021-12-28 VITALS — BP 118/68 | HR 48 | Temp 97.8°F | Ht 68.0 in | Wt 180.0 lb

## 2021-12-28 DIAGNOSIS — C3492 Malignant neoplasm of unspecified part of left bronchus or lung: Secondary | ICD-10-CM

## 2021-12-28 DIAGNOSIS — J449 Chronic obstructive pulmonary disease, unspecified: Secondary | ICD-10-CM | POA: Diagnosis not present

## 2021-12-28 DIAGNOSIS — C3412 Malignant neoplasm of upper lobe, left bronchus or lung: Secondary | ICD-10-CM | POA: Diagnosis not present

## 2021-12-28 DIAGNOSIS — R590 Localized enlarged lymph nodes: Secondary | ICD-10-CM | POA: Diagnosis not present

## 2021-12-28 LAB — CMP (CANCER CENTER ONLY)
ALT: 16 U/L (ref 0–44)
AST: 13 U/L — ABNORMAL LOW (ref 15–41)
Albumin: 3.9 g/dL (ref 3.5–5.0)
Alkaline Phosphatase: 57 U/L (ref 38–126)
Anion gap: 5 (ref 5–15)
BUN: 13 mg/dL (ref 8–23)
CO2: 29 mmol/L (ref 22–32)
Calcium: 8.9 mg/dL (ref 8.9–10.3)
Chloride: 108 mmol/L (ref 98–111)
Creatinine: 1.72 mg/dL — ABNORMAL HIGH (ref 0.61–1.24)
GFR, Estimated: 41 mL/min — ABNORMAL LOW (ref >60)
Glucose, Bld: 79 mg/dL (ref 70–99)
Potassium: 4 mmol/L (ref 3.5–5.1)
Sodium: 142 mmol/L (ref 135–145)
Total Bilirubin: 0.5 mg/dL (ref 0.3–1.2)
Total Protein: 6.6 g/dL (ref 6.5–8.1)

## 2021-12-28 LAB — CBC WITH DIFFERENTIAL (CANCER CENTER ONLY)
Abs Immature Granulocytes: 0.03 10*3/uL (ref 0.00–0.07)
Basophils Absolute: 0 10*3/uL (ref 0.0–0.1)
Basophils Relative: 1 %
Eosinophils Absolute: 0.2 10*3/uL (ref 0.0–0.5)
Eosinophils Relative: 3 %
HCT: 38.4 % — ABNORMAL LOW (ref 39.0–52.0)
Hemoglobin: 13.2 g/dL (ref 13.0–17.0)
Immature Granulocytes: 1 %
Lymphocytes Relative: 32 %
Lymphs Abs: 1.8 10*3/uL (ref 0.7–4.0)
MCH: 32.1 pg (ref 26.0–34.0)
MCHC: 34.4 g/dL (ref 30.0–36.0)
MCV: 93.4 fL (ref 80.0–100.0)
Monocytes Absolute: 0.7 10*3/uL (ref 0.1–1.0)
Monocytes Relative: 12 %
Neutro Abs: 3 10*3/uL (ref 1.7–7.7)
Neutrophils Relative %: 51 %
Platelet Count: 138 10*3/uL — ABNORMAL LOW (ref 150–400)
RBC: 4.11 MIL/uL — ABNORMAL LOW (ref 4.22–5.81)
RDW: 12.6 % (ref 11.5–15.5)
WBC Count: 5.8 10*3/uL (ref 4.0–10.5)
nRBC: 0 % (ref 0.0–0.2)

## 2021-12-28 NOTE — Progress Notes (Signed)
Oncology Nurse Navigator Documentation  Oncology Nurse Navigator Flowsheets 12/28/2021 12/08/2021 12/03/2021 12/19/2018 12/19/2018  Confirmed Diagnosis Date - - - - 12/18/2018  Diagnosis Status Additional Work Up - - - -  Navigator Follow Up Date: 01/03/2022 12/10/2021 12/07/2021 - -  Navigator Follow Up Reason: Appointment Review Appointment Review Appointment Review - -  Navigator Location [place]  Referral Date to RadOnc/MedOnc - - - - 12/18/2018  Navigator Encounter Type Clinic/MDC;Follow-up Appt Clinic/MDC Other: Telephone Telephone  Telephone - - - Incoming Call;Outgoing Call Outgoing Call  Patient Visit Type MedOnc MedOnc Other - -  Treatment Phase Abnormal Scans Abnormal Scans Pre-Tx/Tx Discussion Pre-Tx/Tx Discussion Pre-Tx/Tx Discussion  Barriers/Navigation Needs Education;Coordination of Care Education;Coordination of Care Coordination of Care Education;Coordination of Care Coordination of Care  Education Other Other - Other -  Interventions Coordination of Care;Education;Psycho-Social Support Coordination of Care;Education;Psycho-Social Support Coordination of Care Coordination of Care;Education Coordination of Care  Acuity Level 3-Moderate Needs (3-4 Barriers Identified) Level 3-Moderate Needs (3-4 Barriers Identified) Level 2-Minimal Needs (1-2 Barriers Identified) Level 1 Level 1  Coordination of Care Other Other - Appts Other  Education Method Verbal Verbal - Verbal -  Time Spent with Patient 13 [phone]

## 2021-12-28 NOTE — Assessment & Plan Note (Signed)
Continue his current inhaled medication regimen

## 2021-12-28 NOTE — H&P (View-Only) (Signed)
Subjective:    Patient ID: Jack Taylor, male    DOB: [birth year], 76 y.o.   MRN: [id number]  HPI 76 year old gentleman was in severe COPD on pulmonary function testing. History stage III lung cancer status post lingular resection and recently followed by Dr. Julien Nordmann after abnormal PET scan.   Surveillance PET scan 12/02/2021 performed by Dr. Kipp Brood reviewed by me, shows an enlarging AP window lymph node 10 mm that is hypermetabolic, low-level hypermetabolic activity along the posterior aspect of the left hilum without any discrete lymphadenopathy in that area, mildly prominent right axillary node   Review of Systems As per HPI  Past Medical History:  Diagnosis Date   Adenomatous colon polyp    Anxiety    Blood transfusion without reported diagnosis    Cancer ([place])    Lung; 11/2018   CKD (chronic kidney disease)    stage III 11/09/18   COPD (chronic obstructive pulmonary disease) ([place])    Gastric ulcer    GERD (gastroesophageal reflux disease)    High cholesterol    Hypertension    Pre-diabetes    A1c 6.2% 11/09/18     Family History  Problem Relation Age of Onset   Stomach cancer Mother    Heart disease Father    Colon cancer Neg Hx      Social History   Socioeconomic History   Marital status: Married    Spouse name: Not on file   Number of children: Not on file   Years of education: Not on file   Highest education level: Not on file  Occupational History   Not on file  Tobacco Use   Smoking status: Former    Packs/day: 0.25    Years: 5.00    Pack years: 1.25    Types: Cigarettes    Quit date: 11/04/2018    Years since quitting: 3.1   Smokeless tobacco: Never   Tobacco comments:    recently quit when diagnosed with lung cancer.   Vaping Use   Vaping Use: Never used  Substance and Sexual Activity   Alcohol use: No   Drug use: No   Sexual activity: Not Currently  Other Topics Concern   Not on file  Social History Narrative   Not on file   Social  Determinants of Health   Financial Resource Strain: Not on file  Food Insecurity: Not on file  Transportation Needs: Not on file  Physical Activity: Not on file  Stress: Not on file  Social Connections: Not on file  Intimate Partner Violence: Not on file     No Known Allergies   Outpatient Medications Prior to Visit  Medication Sig Dispense Refill   albuterol (PROVENTIL HFA;VENTOLIN HFA) 108 (90 Base) MCG/ACT inhaler Inhale 2 puffs into the lungs every 6 (six) hours as needed for wheezing or shortness of breath. Reported on 06/02/2016     amLODipine (NORVASC) 10 MG tablet Take 10 mg by mouth daily.      esomeprazole (NEXIUM) 40 MG capsule Take 40 mg by mouth daily.      losartan (COZAAR) 50 MG tablet Take 50 mg by mouth daily.      rosuvastatin (CRESTOR) 10 MG tablet Take 10 mg by mouth daily.     Tiotropium Bromide-Olodaterol 2.5-2.5 MCG/ACT AERS Inhale 1 puff into the lungs daily.     No facility-administered medications prior to visit.         Objective:   Physical Exam Vitals:   12/28/21 1039  BP: 118/68  Pulse: (!) 48  Temp: 97.8 F (36.6 C)  TempSrc: Oral  SpO2: 96%  Weight: 180 lb (81.6 kg)  Height: 5\' 8"  (1.727 m)   Gen: Pleasant, well-nourished, in no distress,  normal affect  ENT: No lesions,  mouth clear,  oropharynx clear, no postnasal drip  Neck: No JVD, no stridor  Lungs: No use of accessory muscles, no crackles or wheezing on normal respiration, no wheeze on forced expiration  Cardiovascular: RRR, heart sounds normal, no murmur or gallops, no peripheral edema  Musculoskeletal: No deformities, no cyanosis or clubbing  Neuro: alert, awake, non focal  Skin: Warm, no lesions or rash      Assessment & Plan:   Adenocarcinoma of left lung, stage 3 (HCC) Enlarging left mediastinal adenopathy with hypermetabolism on PET scan.  Agree that he needs endobronchial ultrasound is possible.  I will arrange for this.  Will discuss results with Dr. Julien Nordmann  when available  COPD (chronic obstructive pulmonary disease) ([place]) Continue his current inhaled medication regimen   Baltazar Apo, MD, PhD 12/28/2021, 11:23 AM [facility] [phone] or if no answer before 7:00PM call [phone] For any issues after 7:00PM please call eLink [phone]

## 2021-12-28 NOTE — Telephone Encounter (Signed)
I scheduled pt for 2/6 at 9:30 at [facility].  He will go for covid test on 2/3.  Spoke to pt's wife per his request and gave her appt info.

## 2021-12-28 NOTE — Patient Instructions (Signed)
We will arrange for bronchoscopy with endobronchial ultrasound to biopsy and evaluate your enlarging chest lymph node.  We will try to get this done as soon as possible at [facility] endoscopy as an outpatient.  You will need a designated driver. Continue inhaled medication as you have been taking it. Follow with Dr Lamonte Sakai in 1 month or next available

## 2021-12-28 NOTE — Assessment & Plan Note (Signed)
Enlarging left mediastinal adenopathy with hypermetabolism on PET scan.  Agree that he needs endobronchial ultrasound is possible.  I will arrange for this.  Will discuss results with Dr. Julien Nordmann when available

## 2021-12-28 NOTE — Progress Notes (Signed)
Subjective:    Patient ID: Jack Taylor, male    DOB: Mar 12, 1946, 76 y.o.   MRN: [id number]  HPI 76 year old gentleman was in severe COPD on pulmonary function testing. History stage III lung cancer status post lingular resection and recently followed by Dr. Julien Nordmann after abnormal PET scan.   Surveillance PET scan 12/02/2021 performed by Dr. Kipp Brood reviewed by me, shows an enlarging AP window lymph node 10 mm that is hypermetabolic, low-level hypermetabolic activity along the posterior aspect of the left hilum without any discrete lymphadenopathy in that area, mildly prominent right axillary node   Review of Systems As per HPI  Past Medical History:  Diagnosis Date   Adenomatous colon polyp    Anxiety    Blood transfusion without reported diagnosis    Cancer ([place])    Lung; 11/2018   CKD (chronic kidney disease)    stage III 11/09/18   COPD (chronic obstructive pulmonary disease) ([place])    Gastric ulcer    GERD (gastroesophageal reflux disease)    High cholesterol    Hypertension    Pre-diabetes    A1c 6.2% 11/09/18     Family History  Problem Relation Age of Onset   Stomach cancer Mother    Heart disease Father    Colon cancer Neg Hx      Social History   Socioeconomic History   Marital status: Married    Spouse name: Not on file   Number of children: Not on file   Years of education: Not on file   Highest education level: Not on file  Occupational History   Not on file  Tobacco Use   Smoking status: Former    Packs/day: 0.25    Years: 5.00    Pack years: 1.25    Types: Cigarettes    Quit date: 11/04/2018    Years since quitting: 3.1   Smokeless tobacco: Never   Tobacco comments:    recently quit when diagnosed with lung cancer.   Vaping Use   Vaping Use: Never used  Substance and Sexual Activity   Alcohol use: No   Drug use: No   Sexual activity: Not Currently  Other Topics Concern   Not on file  Social History Narrative   Not on file   Social  Determinants of Health   Financial Resource Strain: Not on file  Food Insecurity: Not on file  Transportation Needs: Not on file  Physical Activity: Not on file  Stress: Not on file  Social Connections: Not on file  Intimate Partner Violence: Not on file     No Known Allergies   Outpatient Medications Prior to Visit  Medication Sig Dispense Refill   albuterol (PROVENTIL HFA;VENTOLIN HFA) 108 (90 Base) MCG/ACT inhaler Inhale 2 puffs into the lungs every 6 (six) hours as needed for wheezing or shortness of breath. Reported on 06/02/2016     amLODipine (NORVASC) 10 MG tablet Take 10 mg by mouth daily.      esomeprazole (NEXIUM) 40 MG capsule Take 40 mg by mouth daily.      losartan (COZAAR) 50 MG tablet Take 50 mg by mouth daily.      rosuvastatin (CRESTOR) 10 MG tablet Take 10 mg by mouth daily.     Tiotropium Bromide-Olodaterol 2.5-2.5 MCG/ACT AERS Inhale 1 puff into the lungs daily.     No facility-administered medications prior to visit.         Objective:   Physical Exam Vitals:   12/28/21 1039  BP: 118/68  Pulse: (!) 48  Temp: 97.8 F (36.6 C)  TempSrc: Oral  SpO2: 96%  Weight: 180 lb (81.6 kg)  Height: 5\' 8"  (1.727 m)   Gen: Pleasant, well-nourished, in no distress,  normal affect  ENT: No lesions,  mouth clear,  oropharynx clear, no postnasal drip  Neck: No JVD, no stridor  Lungs: No use of accessory muscles, no crackles or wheezing on normal respiration, no wheeze on forced expiration  Cardiovascular: RRR, heart sounds normal, no murmur or gallops, no peripheral edema  Musculoskeletal: No deformities, no cyanosis or clubbing  Neuro: alert, awake, non focal  Skin: Warm, no lesions or rash      Assessment & Plan:   Adenocarcinoma of left lung, stage 3 (HCC) Enlarging left mediastinal adenopathy with hypermetabolism on PET scan.  Agree that he needs endobronchial ultrasound is possible.  I will arrange for this.  Will discuss results with Dr. Julien Nordmann  when available  COPD (chronic obstructive pulmonary disease) ([place]) Continue his current inhaled medication regimen   Baltazar Apo, MD, PhD 12/28/2021, 11:23 AM [facility] [phone] or if no answer before 7:00PM call [phone] For any issues after 7:00PM please call eLink [phone]

## 2021-12-28 NOTE — Progress Notes (Signed)
[place] [place] Telephone:([phone]   Fax:([phone]) [phone]  OFFICE PROGRESS NOTE  [name], MD [address] [place] [address]  DIAGNOSIS: Recurrent non-small cell lung cancer initially diagnosed as stage IA (T1b, N0, M0) non-small cell lung cancer, adenocarcinoma presented with left upper lobe lung nodule diagnosed in January 2020.  The patient has evidence for disease recurrence in AP window lymphadenopathy in December 2022.  PRIOR THERAPY: Status post left lingulectomy with lymph node dissection under the care of Dr. Servando Snare on January 01, 2019  CURRENT THERAPY: Observation  INTERVAL HISTORY: Jack Taylor 76 y.o. male returns to the clinic today for follow-up visit accompanied by his wife.  The patient is feeling fine today with no concerning complaints except for intermittent pain on the left side of the chest.  He denied having any current shortness of breath, cough or hemoptysis.  He denied having any recent weight loss or night sweats.  He has no nausea, vomiting, diarrhea or constipation.  He has no headache or visual changes.  He was referred to Dr. Lamonte Sakai for consideration of bronchoscopy with EBUS but unfortunately his appointment was delayed for unclear reason and he is scheduled to see him today for discussion of this procedure.  MEDICAL HISTORY: Past Medical History:  Diagnosis Date   Adenomatous colon polyp    Anxiety    Blood transfusion without reported diagnosis    Cancer ([place])    Lung; 11/2018   CKD (chronic kidney disease)    stage III 11/09/18   COPD (chronic obstructive pulmonary disease) (HCC)    Gastric ulcer    GERD (gastroesophageal reflux disease)    High cholesterol    Hypertension    Pre-diabetes    A1c 6.2% 11/09/18    ALLERGIES:  has No Known Allergies.  MEDICATIONS:  Current Outpatient Medications  Medication Sig Dispense Refill   albuterol (PROVENTIL HFA;VENTOLIN HFA) 108 (90 Base) MCG/ACT inhaler Inhale  2 puffs into the lungs every 6 (six) hours as needed for wheezing or shortness of breath. Reported on 06/02/2016     amLODipine (NORVASC) 10 MG tablet Take 10 mg by mouth daily.      esomeprazole (NEXIUM) 40 MG capsule Take 40 mg by mouth daily.      losartan (COZAAR) 50 MG tablet Take 50 mg by mouth daily.      rosuvastatin (CRESTOR) 10 MG tablet Take 10 mg by mouth daily.     Tiotropium Bromide-Olodaterol 2.5-2.5 MCG/ACT AERS Inhale 1 puff into the lungs daily.     No current facility-administered medications for this visit.    SURGICAL HISTORY:  Past Surgical History:  Procedure Laterality Date   COLONOSCOPY  2008   ESOPHAGOGASTRODUODENOSCOPY  11/23/2011   Procedure: ESOPHAGOGASTRODUODENOSCOPY (EGD);  Surgeon: Owens Loffler, MD;  Location: [facility];  Service: Endoscopy;  Laterality: N/A;   INTERCOSTAL NERVE BLOCK Left 01/01/2019   Procedure: INTERCOSTAL NERVE BLOCK;  Surgeon: Grace Isaac, MD;  Location: [facility];  Service: Thoracic;  Laterality: Left;   LUNG BIOPSY  12/18/2018   Procedure: LUNG BIOPSY - of Left Upper Lobe Biopsy of #7 Node Biopsy of #10L Node;  Surgeon: Grace Isaac, MD;  Location: [facility];  Service: Thoracic;;   LYMPH NODE DISSECTION Left 01/01/2019   Procedure: LYMPH NODE DISSECTION;  Surgeon: Grace Isaac, MD;  Location: [facility];  Service: Thoracic;  Laterality: Left;   UPPER GASTROINTESTINAL ENDOSCOPY  11/23/11   VIDEO ASSISTED THORACOSCOPY (  VATS)/WEDGE RESECTION Left 01/01/2019   Procedure: VIDEO ASSISTED THORACOSCOPY (VATS)/ LEFT LINGULECTOMY;  Surgeon: Grace Isaac, MD;  Location: [facility];  Service: Thoracic;  Laterality: Left;   VIDEO BRONCHOSCOPY WITH ENDOBRONCHIAL NAVIGATION N/A 12/18/2018   Procedure: VIDEO BRONCHOSCOPY WITH ENDOBRONCHIAL NAVIGATION with Placement of Fiducial Markers x 3;  Surgeon: Grace Isaac, MD;  Location: [facility];  Service: Thoracic;  Laterality: N/A;   VIDEO BRONCHOSCOPY WITH ENDOBRONCHIAL ULTRASOUND N/A 12/18/2018    Procedure: VIDEO BRONCHOSCOPY WITH ENDOBRONCHIAL ULTRASOUND;  Surgeon: Grace Isaac, MD;  Location: [facility];  Service: Thoracic;  Laterality: N/A;    REVIEW OF SYSTEMS:  A comprehensive review of systems was negative except for: Respiratory: positive for pleurisy/chest pain   PHYSICAL EXAMINATION: General appearance: alert, cooperative, fatigued, and no distress Head: Normocephalic, without obvious abnormality, atraumatic Neck: no adenopathy, no JVD, supple, symmetrical, trachea midline, and thyroid not enlarged, symmetric, no tenderness/mass/nodules Lymph nodes: Cervical, supraclavicular, and axillary nodes normal. Resp: clear to auscultation bilaterally Back: symmetric, no curvature. ROM normal. No CVA tenderness. Cardio: regular rate and rhythm, S1, S2 normal, no murmur, click, rub or gallop GI: soft, non-tender; bowel sounds normal; no masses,  no organomegaly Extremities: extremities normal, atraumatic, no cyanosis or edema  ECOG PERFORMANCE STATUS: 1 - Symptomatic but completely ambulatory  Blood pressure 132/76, pulse (!) 47, temperature 98 F (36.7 C), temperature source Oral, resp. rate 17, weight 177 lb 6.4 oz (80.5 kg), SpO2 99 %.  LABORATORY DATA: Lab Results  Component Value Date   WBC 5.8 12/28/2021   HGB 13.2 12/28/2021   HCT 38.4 (L) 12/28/2021   MCV 93.4 12/28/2021   PLT 138 (L) 12/28/2021      Chemistry      Component Value Date/Time   NA 141 01/06/2019 0300   K 3.7 01/06/2019 0300   CL 103 01/06/2019 0300   CO2 28 01/06/2019 0300   BUN 17 01/06/2019 0300   CREATININE 1.15 01/06/2019 0300   CREATININE 1.49 (H) 12/27/2018 1351      Component Value Date/Time   CALCIUM 8.5 (L) 01/06/2019 0300   ALKPHOS 44 01/03/2019 0406   AST 33 01/03/2019 0406   AST 14 (L) 12/27/2018 1351   ALT 22 01/03/2019 0406   ALT 26 12/27/2018 1351   BILITOT 0.9 01/03/2019 0406   BILITOT 0.4 12/27/2018 1351       RADIOGRAPHIC STUDIES: NM PET Image Restag (PS) Skull  Base To Thigh  Result Date: 12/03/2021 CLINICAL DATA:  Subsequent treatment strategy for metastatic left lung non-small cell cancer (adenocarcinoma) with enlarging AP window lymph node on CT. EXAM: NUCLEAR MEDICINE PET SKULL BASE TO THIGH TECHNIQUE: 8.68 mCi F-18 FDG was injected intravenously. Full-ring PET imaging was performed from the skull base to thigh after the radiotracer. CT data was obtained and used for attenuation correction and anatomic localization. Fasting blood glucose: 80 mg/dl COMPARISON:  Chest CT 11/19/2021 and 11/26/2020.  PET-CT 12/11/2018. FINDINGS: Mediastinal blood pool activity: SUV max 1.4 NECK: No hypermetabolic cervical lymph nodes are identified.There are no lesions of the pharyngeal mucosal space. Incidental CT findings: Prominent bilateral carotid atherosclerosis. CHEST: The enlarging AP window lymph node on CT is hypermetabolic. This lymph node measures 10 mm short axis on image 70/4 and has an SUV max of 5.8. Low level hypermetabolic activity is noted along the posterior aspect of the left hilum (SUV max 4.0). No discrete enlarged lymph node is seen in this area, and this metabolic activity is improved from the previous study  at which time it SUV max was 8.1. There is a mildly prominent right axillary node measuring 7 mm on image 67/4 with mild hypermetabolic activity (SUV max 3.3). No recurrent hypermetabolic pulmonary activity or suspicious nodularity. Incidental CT findings: Diffuse atherosclerosis of the aorta, great vessels and coronary arteries. Stable postsurgical changes in the lingula with adjacent scarring. Underlying mild centrilobular and paraseptal emphysema. ABDOMEN/PELVIS: There is no hypermetabolic activity within the liver, adrenal glands, spleen or pancreas. An area of mildly increased activity in segment 5 of the liver adjacent the gallbladder is similar to the previous study and without clear corresponding abnormality on the CT images. This has an SUV max of  4.2. No hypermetabolic abdominal or pelvic lymph nodes. Incidental CT findings: Aortic and branch vessel atherosclerosis. No acute abdominal findings. SKELETON: There is no hypermetabolic activity to suggest osseous metastatic disease. There is prominent muscular activity posterior to the right scapula within the infraspinatus and teres minor muscles. This appears nonfocal. Incidental CT findings: none IMPRESSION: 1. The enlarging AP window lymph node is hypermetabolic, consistent with metastatic disease. 2. No other definite signs of metastatic disease are identified. No suspicious pulmonary nodularity or abnormal metabolic activity. There is a small mildly hypermetabolic right axillary lymph node associated with mild hypermetabolic activity in the adjacent shoulder musculature, favored to be posttraumatic or secondary to recent immunization. Correlate clinically. 3.  Aortic Atherosclerosis (ICD10-I70.0). Electronically Signed   By: Richardean Sale M.D.   On: 12/03/2021 09:34    ASSESSMENT AND PLAN: This is a very pleasant 76 years old white male diagnosed with stage IA (T1b, N0, M0) non-small cell lung cancer, adenocarcinoma presented with left upper lobe lung nodule in January 2020.  The patient has evidence for disease recurrence in AP window lymphadenopathy in December 2022.  He is status post left lingulectomy with lymph node dissection under the care of Dr. Servando Snare on January 01, 2019 The patient has been in observation since that time but recent CT scan of the chest followed by a PET scan in December 2022 showed concerning findings of disease recurrence with enlarging and hypermetabolic AP window lymph node. The patient was referred to see Dr. Lamonte Sakai for consideration of bronchoscopy with EBUS and he is scheduled to see him today for discussion of this procedure. I will arrange for the patient a follow-up visit with me few days after the biopsy is done for more detailed discussion of his treatment  options based on the final pathology. The patient and his wife are in agreement with the current plan. He was advised to call immediately if he has any other concerning symptoms in the interval. The patient voices understanding of current disease status and treatment options and is in agreement with the current care plan.  All questions were answered. The patient knows to call the clinic with any problems, questions or concerns. We can certainly see the patient much sooner if necessary.  Disclaimer: This note was dictated with voice recognition software. Similar sounding words can inadvertently be transcribed and may not be corrected upon review.

## 2022-01-03 ENCOUNTER — Encounter: Payer: Self-pay | Admitting: *Deleted

## 2022-01-03 NOTE — Progress Notes (Signed)
Oncology Nurse Navigator Documentation  Oncology Nurse Navigator Flowsheets 01/03/2022 12/28/2021 12/08/2021 12/03/2021 12/19/2018 12/19/2018  Confirmed Diagnosis Date - - - - - 12/18/2018  Diagnosis Status - Additional Work Up - - - -  Navigator Follow Up Date: 01/13/2022 01/03/2022 12/10/2021 12/07/2021 - -  Navigator Follow Up Reason: Follow-up After Biopsy Appointment Review Appointment Review Appointment Review - -  Navigator Location [place]  Referral Date to RadOnc/MedOnc - - - - - 12/18/2018  Navigator Encounter Type Telephone Clinic/MDC;Follow-up Appt Clinic/MDC Other: Telephone Telephone  Telephone Outgoing Call - - - Incoming Call;Outgoing Call Outgoing Call  Patient Visit Type Other MedOnc MedOnc Other - -  Treatment Phase Abnormal Scans Abnormal Scans Abnormal Scans Pre-Tx/Tx Discussion Pre-Tx/Tx Discussion Pre-Tx/Tx Discussion  Barriers/Navigation Needs Coordination of Care;Education/I followed up on Jack Taylor appt for bx. He is scheduled to be seen on 2/6.  I called him today to set him back up to see Dr. Julien Nordmann. I was unable to reach but did leave vm message with my name and phone number to call.  Education;Coordination of Care Education;Coordination of Care Coordination of Care Education;Coordination of Care Coordination of Care  Education Other Other Other - Other -  Interventions Coordination of Care;Education Coordination of Care;Education;Psycho-Social Support Coordination of Care;Education;Psycho-Social Support Coordination of Care Coordination of Care;Education Coordination of Care  Acuity Level 2-Minimal Needs (1-2 Barriers Identified) Level 3-Moderate Needs (3-4 Barriers Identified) Level 3-Moderate Needs (3-4 Barriers Identified) Level 2-Minimal Needs (1-2 Barriers Identified) Level 1 Level 1  Coordination of Care Other Other Other - Appts Other  Education Method Verbal Verbal Verbal - Verbal -   Time Spent with Patient [phone]

## 2022-01-05 ENCOUNTER — Encounter: Payer: Self-pay | Admitting: *Deleted

## 2022-01-05 NOTE — Progress Notes (Signed)
Oncology Nurse Navigator Documentation  Oncology Nurse Navigator Flowsheets 01/05/2022 01/03/2022 12/28/2021 12/08/2021 12/03/2021 12/19/2018 12/19/2018  Confirmed Diagnosis Date - - - - - - 12/18/2018  Diagnosis Status - - Additional Work Up - - - -  Navigator Follow Up Date: - 01/13/2022 01/03/2022 12/10/2021 12/07/2021 - -  Navigator Follow Up Reason: - Follow-up After Biopsy Appointment Review Appointment Review Appointment Review - -  Navigator Location [place]  Referral Date to RadOnc/MedOnc - - - - - - 12/18/2018  Navigator Encounter Type Telephone Telephone Clinic/MDC;Follow-up Appt Clinic/MDC Other: Telephone Telephone  Telephone Outgoing Call Outgoing Call - - - Incoming Call;Outgoing Call Outgoing Call  Patient Visit Type - Other MedOnc MedOnc Other - -  Treatment Phase - Abnormal Scans Abnormal Scans Abnormal Scans Pre-Tx/Tx Discussion Pre-Tx/Tx Discussion Pre-Tx/Tx Discussion  Barriers/Navigation Needs Coordination of Care;Education Coordination of Care;Education Education;Coordination of Care Education;Coordination of Care Coordination of Care Education;Coordination of Care Coordination of Care  Education Other Other Other Other - Other -  Interventions Coordination of Care;Education/I called Mr. Vipond to set him up with Dr. Julien Nordmann after his bx. I was unable to reach but did leave vm message with my name and phone number to call.  Coordination of Care;Education Coordination of Care;Education;Psycho-Social Support Coordination of Care;Education;Psycho-Social Support Coordination of Care Coordination of Care;Education Coordination of Care  Acuity Level 2-Minimal Needs (1-2 Barriers Identified) Level 2-Minimal Needs (1-2 Barriers Identified) Level 3-Moderate Needs (3-4 Barriers Identified) Level 3-Moderate Needs (3-4 Barriers Identified) Level 2-Minimal Needs (1-2 Barriers Identified) Level 1  Level 1  Coordination of Care Other Other Other Other - Appts Other  Education Method Verbal Verbal Verbal Verbal - Verbal -  Time Spent with Patient 30 30 30  45 30 15 15

## 2022-01-06 ENCOUNTER — Encounter (HOSPITAL_COMMUNITY): Payer: Self-pay | Admitting: Emergency Medicine

## 2022-01-06 ENCOUNTER — Other Ambulatory Visit: Payer: Self-pay

## 2022-01-06 ENCOUNTER — Encounter: Payer: Self-pay | Admitting: *Deleted

## 2022-01-06 NOTE — Progress Notes (Signed)
PCP - Dr. Daron Offer Cardiologist - denies EKG - [employer] TEST- 01/08/22   Anesthesia review: n/a  -------------  SDW INSTRUCTIONS:  Your procedure is scheduled on Monday 2/6. Please report to [facility] Entr[facility] "A" at 06:45 A.M., and check in at the Admitting office. Call this number if you have problems the morning of surgery: [phone]   Remember: Do not eat or drink after midnight the night before your surgery   Medications to take morning of surgery with a sip of water include: amLODipine (NORVASC)  esomeprazole (NEXIUM)  rosuvastatin (CRESTOR)  Inhaler --- Please bring all inhalers with you the day of surgery.   As of today, STOP taking any Aspirin (unless otherwise instructed by your surgeon), Aleve, Naproxen, Ibuprofen, Motrin, Advil, Goody's, BC's, all herbal medications, fish oil, and all vitamins.    The Morning of Surgery Do not wear jewelry Do not wear lotions, powders, colognes, or deodorant Do not bring valuables to the hospital. [facility] is not responsible for any belongings or valuables.  If you are a smoker, DO NOT Smoke 24 hours prior to surgery  If you wear a CPAP at night please bring your mask the morning of surgery   Remember that you must have someone to transport you home after your surgery, and remain with you for 24 hours if you are discharged the same day.  Please bring cases for contacts, glasses, hearing aids, dentures or bridgework because it cannot be worn into surgery.   Patients discharged the day of surgery will not be allowed to drive home.   Please shower the NIGHT BEFORE/MORNING OF SURGERY (use antibacterial soap like DIAL soap if possible). Wear comfortable clothes the morning of surgery. Oral Hygiene is also important to reduce your risk of infection.  Remember - BRUSH YOUR TEETH THE MORNING OF SURGERY WITH YOUR REGULAR TOOTHPASTE  Patient denies shortness of breath, fever, cough and chest pain.

## 2022-01-06 NOTE — Progress Notes (Signed)
Oncology Nurse Navigator Documentation  Oncology Nurse Navigator Flowsheets 01/06/2022 01/05/2022 01/03/2022 12/28/2021 12/08/2021 12/03/2021 12/19/2018  Confirmed Diagnosis Date - - - - - - -  Diagnosis Status - - - Additional Work Up - - -  Navigator Follow Up Date: - - 01/13/2022 01/03/2022 12/10/2021 12/07/2021 -  Navigator Follow Up Reason: - - Follow-up After Biopsy Appointment Review Appointment Review Appointment Review -  Navigator Location [place]  Referral Date to RadOnc/MedOnc - - - - - - -  Navigator Encounter Type Telephone Telephone Telephone Clinic/MDC;Follow-up Appt Clinic/MDC Other: Telephone  Telephone Incoming Call Outgoing Call Outgoing Call - - - Incoming Call;Outgoing Call  Patient Visit Type - - Other MedOnc MedOnc Other -  Treatment Phase - - Abnormal Scans Abnormal Scans Abnormal Scans Pre-Tx/Tx Discussion Pre-Tx/Tx Discussion  Barriers/Navigation Needs Coordination of Care;Education Coordination of Care;Education Coordination of Care;Education Education;Coordination of Care Education;Coordination of Care Coordination of Care Education;Coordination of Care  Education Other Other Other Other Other - Other  Interventions Coordination of Care;Psycho-Social Support;Education/patient's wife called.  I was able to schedule patient for a follow up post bx.  She verbalized understanding.  Coordination of Care;Education Coordination of Care;Education Coordination of Care;Education;Psycho-Social Support Coordination of Care;Education;Psycho-Social Support Coordination of Care Coordination of Care;Education  Acuity Level 2-Minimal Needs (1-2 Barriers Identified) Level 2-Minimal Needs (1-2 Barriers Identified) Level 2-Minimal Needs (1-2 Barriers Identified) Level 3-Moderate Needs (3-4 Barriers Identified) Level 3-Moderate Needs (3-4 Barriers Identified) Level 2-Minimal Needs (1-2 Barriers  Identified) Level 1  Coordination of Care Appts Other Other Other Other - Appts  Education Method Verbal Verbal Verbal Verbal Verbal - Verbal  Time Spent with Patient 30 30 30 30  45 30 15

## 2022-01-07 ENCOUNTER — Other Ambulatory Visit: Payer: Self-pay | Admitting: Emergency Medicine

## 2022-01-08 LAB — SARS CORONAVIRUS 2 (TAT 6-24 HRS): SARS Coronavirus 2: NEGATIVE

## 2022-01-10 ENCOUNTER — Ambulatory Visit (HOSPITAL_COMMUNITY)
Admission: RE | Admit: 2022-01-10 | Discharge: 2022-01-10 | Disposition: A | Discharge status: Home / Self Care | Payer: PPO | Attending: Emergency Medicine | Admitting: Emergency Medicine

## 2022-01-10 ENCOUNTER — Other Ambulatory Visit: Payer: Self-pay

## 2022-01-10 ENCOUNTER — Ambulatory Visit (HOSPITAL_COMMUNITY): Payer: PPO | Admitting: Anesthesiology

## 2022-01-10 ENCOUNTER — Encounter (HOSPITAL_COMMUNITY)
Admission: RE | Disposition: A | Discharge status: Home / Self Care | Payer: Self-pay | Source: Home / Self Care | Attending: Emergency Medicine

## 2022-01-10 ENCOUNTER — Encounter (HOSPITAL_COMMUNITY): Payer: Self-pay | Admitting: Emergency Medicine

## 2022-01-10 DIAGNOSIS — E785 Hyperlipidemia, unspecified: Secondary | ICD-10-CM | POA: Diagnosis not present

## 2022-01-10 DIAGNOSIS — F419 Anxiety disorder, unspecified: Secondary | ICD-10-CM | POA: Insufficient documentation

## 2022-01-10 DIAGNOSIS — I10 Essential (primary) hypertension: Secondary | ICD-10-CM | POA: Insufficient documentation

## 2022-01-10 DIAGNOSIS — Z8711 Personal history of peptic ulcer disease: Secondary | ICD-10-CM | POA: Diagnosis not present

## 2022-01-10 DIAGNOSIS — J449 Chronic obstructive pulmonary disease, unspecified: Secondary | ICD-10-CM | POA: Diagnosis not present

## 2022-01-10 DIAGNOSIS — C3492 Malignant neoplasm of unspecified part of left bronchus or lung: Secondary | ICD-10-CM | POA: Diagnosis present

## 2022-01-10 DIAGNOSIS — R590 Localized enlarged lymph nodes: Secondary | ICD-10-CM | POA: Diagnosis present

## 2022-01-10 DIAGNOSIS — R896 Abnormal cytological findings in specimens from other organs, systems and tissues: Secondary | ICD-10-CM | POA: Insufficient documentation

## 2022-01-10 DIAGNOSIS — Z87891 Personal history of nicotine dependence: Secondary | ICD-10-CM | POA: Diagnosis not present

## 2022-01-10 DIAGNOSIS — K219 Gastro-esophageal reflux disease without esophagitis: Secondary | ICD-10-CM | POA: Diagnosis not present

## 2022-01-10 HISTORY — PX: VIDEO BRONCHOSCOPY WITH ENDOBRONCHIAL ULTRASOUND: SHX6177

## 2022-01-10 HISTORY — PX: BRONCHIAL NEEDLE ASPIRATION BIOPSY: SHX5106

## 2022-01-10 LAB — GLUCOSE, CAPILLARY
Glucose-Capillary: 123 mg/dL — ABNORMAL HIGH (ref 70–99)
Glucose-Capillary: 117 mg/dL — ABNORMAL HIGH (ref 70–99)

## 2022-01-10 SURGERY — BRONCHOSCOPY, WITH EBUS
Anesthesia: General | Laterality: Left

## 2022-01-10 MED ORDER — CHLORHEXIDINE GLUCONATE 0.12 % MT SOLN
OROMUCOSAL | Status: AC
  Filled 2022-01-10: qty 15

## 2022-01-10 MED ORDER — ONDANSETRON HCL 4 MG/2ML IJ SOLN: 4.0000 mg | Freq: Once | INTRAMUSCULAR | Status: DC | PRN

## 2022-01-10 MED ORDER — ONDANSETRON HCL 4 MG/2ML IJ SOLN
INTRAMUSCULAR | Status: DC | PRN
  Administered 2022-01-10: 4 mg via INTRAVENOUS

## 2022-01-10 MED ORDER — PROPOFOL 10 MG/ML IV BOLUS
INTRAVENOUS | Status: DC | PRN
  Administered 2022-01-10: 120 mg via INTRAVENOUS
  Administered 2022-01-10 (×2): 30 mg via INTRAVENOUS

## 2022-01-10 MED ORDER — ROCURONIUM BROMIDE 100 MG/10ML IV SOLN
INTRAVENOUS | Status: DC | PRN
  Administered 2022-01-10: 60 mg via INTRAVENOUS

## 2022-01-10 MED ORDER — DEXAMETHASONE SODIUM PHOSPHATE 10 MG/ML IJ SOLN
INTRAMUSCULAR | Status: DC | PRN
  Administered 2022-01-10: 5 mg via INTRAVENOUS

## 2022-01-10 MED ORDER — LACTATED RINGERS IV SOLN: INTRAVENOUS | Status: DC

## 2022-01-10 MED ORDER — FENTANYL CITRATE (PF) 100 MCG/2ML IJ SOLN: 25.0000 ug | INTRAMUSCULAR | Status: DC | PRN

## 2022-01-10 MED ORDER — SUGAMMADEX SODIUM 200 MG/2ML IV SOLN
INTRAVENOUS | Status: DC | PRN
  Administered 2022-01-10: 400 mg via INTRAVENOUS

## 2022-01-10 MED ORDER — ESMOLOL HCL 100 MG/10ML IV SOLN
INTRAVENOUS | Status: DC | PRN
  Administered 2022-01-10: 20 mg via INTRAVENOUS

## 2022-01-10 MED ORDER — LIDOCAINE HCL (CARDIAC) PF 100 MG/5ML IV SOSY
PREFILLED_SYRINGE | INTRAVENOUS | Status: DC | PRN
  Administered 2022-01-10: 60 mg via INTRATRACHEAL

## 2022-01-10 NOTE — Op Note (Signed)
Video Bronchoscopy with Endobronchial Ultrasound Procedure Note  Date of Operation: 01/10/2022  Pre-op Diagnosis: Left mediastinal adenopathy  Post-op Diagnosis: Same  Surgeon: Baltazar Apo  Assistants: None  Anesthesia: General endotracheal anesthesia  Operation: Flexible video fiberoptic bronchoscopy with endobronchial ultrasound and biopsies.  Estimated Blood Loss: Minimal  Complications: None apparent  Indications and History: Jack Taylor is a 76 y.o. male with non-small cell lung cancer that was treated with lingulectomy.  He was found to have hypermetabolic 4L node on surveillance PET scan.  Recommendation made to achieve a tissue diagnosis via bronchoscopy with endobronchial ultrasound and biopsies.  The risks, benefits, complications, treatment options and expected outcomes were discussed with the patient.  The possibilities of pneumothorax, pneumonia, reaction to medication, pulmonary aspiration, perforation of a viscus, bleeding, failure to diagnose a condition and creating a complication requiring transfusion or operation were discussed with the patient who freely signed the consent.    Description of Procedure: The patient was examined in the preoperative area and history and data from the preprocedure consultation were reviewed. It was deemed appropriate to proceed.  The patient was taken to [facility] endoscopy room 3, identified as Jack Taylor and the procedure verified as Flexible Video Fiberoptic Bronchoscopy.  A Time Out was held and the above information confirmed. After being taken to the operating room general anesthesia was initiated and the patient  was orally intubated. The video fiberoptic bronchoscope was introduced via the endotracheal tube and a general inspection was performed which showed normal airways on the right.  The lingula was absent postresection, stump looked normal.  The remaining left-sided airways were normal.. The standard scope was then withdrawn  and the endobronchial ultrasound was used to identify and characterize the peritracheal, hilar and bronchial lymph nodes. Inspection showed a cluster of enlarged nodes at station 4L. Using real-time ultrasound guidance Wang needle biopsies were take from Station for L nodes and were sent for cytology. The patient tolerated the procedure well without apparent complications. There was no significant blood loss. The bronchoscope was withdrawn. Anesthesia was reversed and the patient was taken to the PACU for recovery.   Samples: 1. Wang needle biopsies from 4L nodes  Plans:  The patient will be discharged from the PACU to home when recovered from anesthesia. We will review the cytology, pathology and microbiology results with the patient when they become available. Outpatient followup will be with Dr Lamonte Sakai and Dr Julien Nordmann.    Baltazar Apo, MD, PhD 01/10/2022, 9:56 AM [facility] [phone] or if no answer before 7:00PM call [phone] For any issues after 7:00PM please call eLink [phone]

## 2022-01-10 NOTE — Discharge Instructions (Signed)
Flexible Bronchoscopy, Care After This sheet gives you information about how to care for yourself after your test. Your doctor may also give you more specific instructions. If you have problems or questions, contact your doctor. Follow these instructions at home: Eating and drinking Do not eat or drink anything (not even water) for 2 hours after your test, or until your numbing medicine (local anesthetic) wears off. When your numbness is gone and your cough and gag reflexes have come back, you may: Eat only soft foods. Slowly drink liquids. The day after the test, go back to your normal diet. Driving Do not drive for 24 hours if you were given a medicine to help you relax (sedative). Do not drive or use heavy machinery while taking prescription pain medicine. General instructions  Take over-the-counter and prescription medicines only as told by your doctor. Return to your normal activities as told. Ask what activities are safe for you. Do not use any products that have nicotine or tobacco in them. This includes cigarettes and e-cigarettes. If you need help quitting, ask your doctor. Keep all follow-up visits as told by your doctor. This is important. It is very important if you had a tissue sample (biopsy) taken. Get help right away if: You have shortness of breath that gets worse. You get light-headed. You feel like you are going to pass out (faint). You have chest pain. You cough up: More than a little blood. More blood than before. Summary Do not eat or drink anything (not even water) for 2 hours after your test, or until your numbing medicine wears off. Do not use cigarettes. Do not use e-cigarettes. Get help right away if you have chest pain.  Please call our office for any questions or concerns. [phone].   This information is not intended to replace advice given to you by your health care provider. Make sure you discuss any questions you have with your health care  provider. Document Released: 09/18/2009 Document Revised: 11/03/2017 Document Reviewed: 12/09/2016 Elsevier Patient Education  2020 [employer].

## 2022-01-10 NOTE — Anesthesia Postprocedure Evaluation (Signed)
Anesthesia Post Note  Patient: Jack Taylor  Procedure(s) Performed: VIDEO BRONCHOSCOPY WITH ENDOBRONCHIAL ULTRASOUND (Left) BRONCHIAL NEEDLE ASPIRATION BIOPSIES     Patient location during evaluation: PACU Anesthesia Type: General Level of consciousness: awake and alert and oriented Pain management: pain level controlled Vital Signs Assessment: post-procedure vital signs reviewed and stable Respiratory status: spontaneous breathing, nonlabored ventilation and respiratory function stable Cardiovascular status: blood pressure returned to baseline and stable Postop Assessment: no apparent nausea or vomiting Anesthetic complications: no   No notable events documented.  Last Vitals:  Vitals:   01/10/22 1020 01/10/22 1035  BP: (!) 141/76 123/75  Pulse: (!) 51 (!) 54  Resp: 16 13  Temp:  36.8 C  SpO2: 96% 95%    Last Pain:  Vitals:   01/10/22 1035  TempSrc:   PainSc: 0-No pain                 [name]

## 2022-01-10 NOTE — Anesthesia Preprocedure Evaluation (Addendum)
Anesthesia Evaluation  Patient identified by MRN, date of birth, ID band Patient awake    Reviewed: Allergy & Precautions, NPO status , Patient's Chart, lab work & pertinent test results, reviewed documented beta blocker date and time   Airway Mallampati: II  TM Distance: >3 FB Neck ROM: Full    Dental  (+) Edentulous Lower, Edentulous Upper   Pulmonary COPD,  COPD inhaler, former smoker,  Mediastinal adenopathy Hx/o Lung Ca S/P VATs   breath sounds clear to auscultation + decreased breath sounds      Cardiovascular hypertension, Pt. on medications  Rhythm:Regular Rate:Bradycardia  EKG 01/10/22 Sinus Bradycardia, otherwise normal   Neuro/Psych Anxiety negative neurological ROS     GI/Hepatic Neg liver ROS, PUD, GERD  Medicated and Controlled,  Endo/Other  Hyperlipidemia  Renal/GU Renal InsufficiencyRenal disease  negative genitourinary   Musculoskeletal negative musculoskeletal ROS (+)   Abdominal   Peds  Hematology  (+) Blood dyscrasia, anemia ,   Anesthesia Other Findings   Reproductive/Obstetrics                            Anesthesia Physical Anesthesia Plan  ASA: 3  Anesthesia Plan: General   Post-op Pain Management:    Induction: Intravenous  PONV Risk Score and Plan: 3 and Treatment may vary due to age or medical condition and Ondansetron  Airway Management Planned: Oral ETT  Additional Equipment:   Intra-op Plan:   Post-operative Plan: Extubation in OR  Informed Consent: I have reviewed the patients History and Physical, chart, labs and discussed the procedure including the risks, benefits and alternatives for the proposed anesthesia with the patient or authorized representative who has indicated his/her understanding and acceptance.     Dental advisory given  Plan Discussed with: CRNA and Anesthesiologist  Anesthesia Plan Comments:         Anesthesia Quick  Evaluation

## 2022-01-10 NOTE — Anesthesia Procedure Notes (Signed)
Procedure Name: Intubation Date/Time: 01/10/2022 9:16 AM Performed by: Reeves Dam, CRNA Pre-anesthesia Checklist: Patient identified, Patient being monitored, Timeout performed, Emergency Drugs available and Suction available Patient Re-evaluated:Patient Re-evaluated prior to induction Oxygen Delivery Method: Circle system utilized Preoxygenation: Pre-oxygenation with 100% oxygen Induction Type: IV induction Ventilation: Mask ventilation without difficulty Laryngoscope Size: 3 and Miller Grade View: Grade I Tube type: Oral Tube size: 8.5 mm Number of attempts: 1 Airway Equipment and Method: Stylet Placement Confirmation: ETT inserted through vocal cords under direct vision, positive ETCO2 and breath sounds checked- equal and bilateral Secured at: 22 cm Tube secured with: Tape Dental Injury: Teeth and Oropharynx as per pre-operative assessment

## 2022-01-10 NOTE — Transfer of Care (Signed)
Immediate Anesthesia Transfer of Care Note  Patient: Jack Taylor  Procedure(s) Performed: VIDEO BRONCHOSCOPY WITH ENDOBRONCHIAL ULTRASOUND (Left) BRONCHIAL NEEDLE ASPIRATION BIOPSIES  Patient Location: PACU  Anesthesia Type:General  Level of Consciousness: awake, alert , oriented and patient cooperative  Airway & Oxygen Therapy: Patient Spontanous Breathing and Patient connected to face mask oxygen  Post-op Assessment: Report given to RN, Post -op Vital signs reviewed and stable and Patient moving all extremities X 4  Post vital signs: Reviewed and stable  Last Vitals:  Vitals Value Taken Time  BP 132/68 01/10/22 1005  Temp 36.8 C 01/10/22 1005  Pulse 63 01/10/22 1013  Resp 12 01/10/22 1013  SpO2 97 % 01/10/22 1013  Vitals shown include unvalidated device data.  Last Pain:  Vitals:   01/10/22 1005  TempSrc:   PainSc: 0-No pain         Complications: No notable events documented.

## 2022-01-10 NOTE — Interval H&P Note (Signed)
History and Physical Interval Note:  01/10/2022 7:32 AM  Jack Taylor  has presented today for surgery, with the diagnosis of MEDIASTINAL ADENOPATHY.  The various methods of treatment have been discussed with the patient and family. After consideration of risks, benefits and other options for treatment, the patient has consented to  Procedure(s): [place] (Left) as a surgical intervention.  The patient's history has been reviewed, patient examined, no change in status, stable for surgery.  I have reviewed the patient's chart and labs.  Questions were answered to the patient's satisfaction.     Collene Gobble

## 2022-01-12 NOTE — Progress Notes (Signed)
[place] OFFICE PROGRESS NOTE  [name], MD [address] [place] [address]  DIAGNOSIS:  Recurrent non-small cell lung cancer initially diagnosed as stage IA (T1b, N0, M0) non-small cell lung cancer, adenocarcinoma presented with left upper lobe lung nodule diagnosed in January 2020.  The patient has evidence for disease recurrence in AP window lymphadenopathy in December 2022.  PRIOR THERAPY: Status post left lingulectomy with lymph node dissection under the care of Dr. Servando Snare on January 01, 2019  CURRENT THERAPY: Observation  INTERVAL HISTORY: Jack Taylor 77 y.o. male returns to the clinic today for a follow-up visit accompanied by his wife.  The patient is feeling fairly well today without any concerning complaints except he is nervous about his biopsy results. The patient was last seen in the clinic on 12/24/2021.  The patient still needed tissue biopsy in order to discuss recommended treatment for his condition, as him imaging findings were concerning for recurrence.  The patient underwent bronchoscopy with EBUS on 01/10/2022 under the care of Dr. Lamonte Sakai.  The final pathology is consistent with recurrence of his non-small cell lung cancer, adenocarcinoma.    Otherwise the patient denies any new concerning complaints today.  Denies any fever, chills, night sweats, or unexplained weight loss.  Denies any current cough or hemoptysis. He has baseline dyspnea on exertion.  Denies any nausea, vomiting, diarrhea, or constipation.  Denies any headache or visual changes.  The patient is here today for evaluation and a detailed discussion about his current condition and recommended treatment options based on the final pathology.    MEDICAL HISTORY: Past Medical History:  Diagnosis Date   Adenomatous colon polyp    Anxiety    Blood transfusion without reported diagnosis    Cancer ([facility])    Lung; 11/2018   CKD (chronic kidney disease)    stage III 11/09/18    COPD (chronic obstructive pulmonary disease) (HCC)    Gastric ulcer    GERD (gastroesophageal reflux disease)    High cholesterol    Hypertension    Pre-diabetes    A1c 6.2% 11/09/18    ALLERGIES:  has No Known Allergies.  MEDICATIONS:  Current Outpatient Medications  Medication Sig Dispense Refill   albuterol (PROVENTIL HFA;VENTOLIN HFA) 108 (90 Base) MCG/ACT inhaler Inhale 2 puffs into the lungs every 6 (six) hours as needed for wheezing or shortness of breath. Reported on 06/02/2016     amLODipine (NORVASC) 10 MG tablet Take 10 mg by mouth daily.      esomeprazole (NEXIUM) 40 MG capsule Take 40 mg by mouth daily.      losartan (COZAAR) 50 MG tablet Take 50 mg by mouth daily.      prochlorperazine (COMPAZINE) 10 MG tablet Take 1 tablet (10 mg total) by mouth every 6 (six) hours as needed. 30 tablet 2   rosuvastatin (CRESTOR) 10 MG tablet Take 10 mg by mouth daily.     Tiotropium Bromide-Olodaterol 2.5-2.5 MCG/ACT AERS Inhale 1 puff into the lungs at bedtime. Anoro     traZODone (DESYREL) 50 MG tablet Take 50 mg by mouth at bedtime.     No current facility-administered medications for this visit.    SURGICAL HISTORY:  Past Surgical History:  Procedure Laterality Date   COLONOSCOPY  2008   ESOPHAGOGASTRODUODENOSCOPY  11/23/2011   Procedure: ESOPHAGOGASTRODUODENOSCOPY (EGD);  Surgeon: Owens Loffler, MD;  Location: [facility];  Service: Endoscopy;  Laterality: N/A;   INTERCOSTAL NERVE BLOCK Left 01/01/2019  Procedure: INTERCOSTAL NERVE BLOCK;  Surgeon: Grace Isaac, MD;  Location: [facility];  Service: Thoracic;  Laterality: Left;   LUNG BIOPSY  12/18/2018   Procedure: LUNG BIOPSY - of Left Upper Lobe Biopsy of #7 Node Biopsy of #10L Node;  Surgeon: Grace Isaac, MD;  Location: [facility];  Service: Thoracic;;   LYMPH NODE DISSECTION Left 01/01/2019   Procedure: LYMPH NODE DISSECTION;  Surgeon: Grace Isaac, MD;  Location: [facility];  Service: Thoracic;  Laterality: Left;    UPPER GASTROINTESTINAL ENDOSCOPY  11/23/11   VIDEO ASSISTED THORACOSCOPY (VATS)/WEDGE RESECTION Left 01/01/2019   Procedure: VIDEO ASSISTED THORACOSCOPY (VATS)/ LEFT LINGULECTOMY;  Surgeon: Grace Isaac, MD;  Location: [facility];  Service: Thoracic;  Laterality: Left;   VIDEO BRONCHOSCOPY WITH ENDOBRONCHIAL NAVIGATION N/A 12/18/2018   Procedure: VIDEO BRONCHOSCOPY WITH ENDOBRONCHIAL NAVIGATION with Placement of Fiducial Markers x 3;  Surgeon: Grace Isaac, MD;  Location: [facility];  Service: Thoracic;  Laterality: N/A;   VIDEO BRONCHOSCOPY WITH ENDOBRONCHIAL ULTRASOUND N/A 12/18/2018   Procedure: VIDEO BRONCHOSCOPY WITH ENDOBRONCHIAL ULTRASOUND;  Surgeon: Grace Isaac, MD;  Location: [facility];  Service: Thoracic;  Laterality: N/A;    REVIEW OF SYSTEMS:   Review of Systems  Constitutional: Negative for appetite change, chills, fatigue, fever and unexpected weight change.  HENT: Negative for mouth sores, nosebleeds, sore throat and trouble swallowing.   Eyes: Negative for eye problems and icterus.  Respiratory: Positive for dyspnea on exertion. Negative for cough, hemoptysis, and wheezing.   Cardiovascular: Negative for chest pain and leg swelling.  Gastrointestinal: Negative for abdominal pain, constipation, diarrhea, nausea and vomiting.  Genitourinary: Negative for bladder incontinence, difficulty urinating, dysuria, frequency and hematuria.   Musculoskeletal: Negative for back pain, gait problem, neck pain and neck stiffness.  Skin: Negative for itching and rash.  Neurological: Negative for dizziness, extremity weakness, gait problem, headaches, light-headedness and seizures.  Hematological: Negative for adenopathy. Does not bruise/bleed easily.  Psychiatric/Behavioral: Negative for confusion, depression and sleep disturbance. The patient is not nervous/anxious.     PHYSICAL EXAMINATION:  Blood pressure 125/66, pulse (!) 48, temperature (!) 97 F (36.1 C), temperature source  Tympanic, resp. rate 19, height 5' 8.5" (1.74 m), weight 175 lb 1.6 oz (79.4 kg), SpO2 97 %.  ECOG PERFORMANCE STATUS: 1  Physical Exam  Constitutional: Oriented to person, place, and time and well-developed, well-nourished, and in no distress. HENT:  Head: Normocephalic and atraumatic. Hard of hearing.  Mouth/Throat: Oropharynx is clear and moist. No oropharyngeal exudate.  Eyes: Conjunctivae are normal. Right eye exhibits no discharge. Left eye exhibits no discharge. No scleral icterus.  Neck: Normal range of motion. Neck supple.  Cardiovascular: Normal rate, regular rhythm, normal heart sounds and intact distal pulses.   Pulmonary/Chest: Effort normal. Quiet breath sounds in all lung fields. No respiratory distress. No wheezes. No rales.  Abdominal: Soft. Bowel sounds are normal. Exhibits no distension and no mass. There is no tenderness.  Musculoskeletal: Normal range of motion. Exhibits no edema.  Lymphadenopathy:    No cervical adenopathy.  Neurological: Alert and oriented to person, place, and time. Exhibits normal muscle tone. Gait normal. Coordination normal.  Skin: Skin is warm and dry. No rash noted. Not diaphoretic. No erythema. No pallor.  Psychiatric: Mood, memory and judgment normal.  Vitals reviewed.  LABORATORY DATA: Lab Results  Component Value Date   WBC 5.8 12/28/2021   HGB 13.2 12/28/2021   HCT 38.4 (L) 12/28/2021   MCV 93.4 12/28/2021  PLT 138 (L) 12/28/2021      Chemistry      Component Value Date/Time   NA 142 12/28/2021 0740   K 4.0 12/28/2021 0740   CL 108 12/28/2021 0740   CO2 29 12/28/2021 0740   BUN 13 12/28/2021 0740   CREATININE 1.72 (H) 12/28/2021 0740      Component Value Date/Time   CALCIUM 8.9 12/28/2021 0740   ALKPHOS 57 12/28/2021 0740   AST 13 (L) 12/28/2021 0740   ALT 16 12/28/2021 0740   BILITOT 0.5 12/28/2021 0740       RADIOGRAPHIC STUDIES:  No results found.   ASSESSMENT/PLAN:  This is a very pleasant 76 year old  Caucasian male with a history of stage IA (T1b, N0, M0) non-small cell lung cancer, adenocarcinoma. He presented with  left upper lobe lung nodule in January 2020.  The patient has evidence for disease recurrence in AP window lymphadenopathy in December 2022.  He is status post left lingulectomy with lymph node dissection under the care of Dr. Servando Snare on January 01, 2019.   I will reach out to pathology today to request PDL1 and foundation one testing.   The patient had been in observation since that time but recent CT scan of the chest followed by a PET scan in December 2022 showed concerning findings of disease recurrence with enlarging and hypermetabolic AP window lymph node.  The patient underwent bronchoscopy and EBUS under the care of Dr. Lamonte Sakai on 01/10/22. The final pathology is still pending.   The patient was seen with Dr. Julien Nordmann today. Dr. Julien Nordmann discussed that if this is consistent with non-small cell lung cancer, adenocarcinoma he would recommend concurrent chemoradiation with carboplatin for an AUC of 2 and paclitaxel 45 mg/m2. The patient is interested in this option and is expected to start this 01/24/22.   I have placed a referral to radiation oncology.  I will arrange for chemo education class prior to starting his first cycle of treatment.  The adverse side effects of treatment were discussed including but not limited to alopecia, myelosuppression, nausea, vomiting, liver, and kidney dysfunction.  I have sent a prescription for Compazine 10 mg p.o. every 6 hours as needed to the patient's pharmacy.  We will see him back for follow-up visit in 3 weeks for evaluation of 1 week follow-up visit before starting cycle #2.  The patient's wife has dropped of FMLA paperwork today. We will ensure that this gets to the [facility] department to start processing this.   I have placed an order for an MRI of the brain just to ensure no metastatic disease involvement.   The patient was advised to  call immediately if she has any concerning symptoms in the interval. The patient voices understanding of current disease status and treatment options and is in agreement with the current care plan. All questions were answered. The patient knows to call the clinic with any problems, questions or concerns. We can certainly see the patient much sooner if necessary      Orders Placed This Encounter  Procedures   MR Brain W Wo Contrast    Standing Status:   Future    Standing Expiration Date:   01/13/2023    Order Specific Question:   If indicated for the ordered procedure, I authorize the administration of contrast media per Radiology protocol    Answer:   Yes    Order Specific Question:   What is the patient's sedation requirement?    Answer:  No Sedation    Order Specific Question:   Does the patient have a pacemaker or implanted devices?    Answer:   No    Order Specific Question:   Use SRS Protocol?    Answer:   No    Order Specific Question:   Preferred imaging location?    Answer:   [facility] (table limit - 550 lbs)   CBC with Differential ([facility] Only)    Standing Status:   Standing    Number of Occurrences:   6    Standing Expiration Date:   01/13/2023   CMP (Cancer Center only)    Standing Status:   Standing    Number of Occurrences:   6    Standing Expiration Date:   01/13/2023   Ambulatory referral to Radiation Oncology    Referral Priority:   Routine    Referral Type:   Consultation    Referral Reason:   Specialty Services Required    Requested Specialty:   Radiation Oncology    Number of Visits Requested:   [place], PA-C 01/13/22  ADDENDUM: Hematology/Oncology Attending: I had a face-to-face encounter with the patient today.  I reviewed his record, lab, scan, pathology report and recommended his care plan.  This is a very pleasant 76 years old white male with recurrent non-small cell lung cancer that was initially diagnosed  as a stage Ia (T1b, N0, M0) non-small cell lung cancer, adenocarcinoma status post left lingulectomy with lymph node dissection under the care of Dr. Servando Snare in January 2020 and the patient was found to have evidence for disease recurrence in December 2022 with enlarging AP window lymphadenopathy that was recently confirmed to be metastatic adenocarcinoma. I had a lengthy discussion with the patient and his wife today about his current disease stage, prognosis and treatment options. I recommended for the patient to complete the staging work-up by ordering MRI of the brain to rule out brain metastasis. I will also send his tissue for molecular studies and PD-L1 expression. I discussed with the patient his treatment option I recommended for him a course of concurrent chemoradiation with weekly carboplatin for AUC of 2 and paclitaxel 45 Mg/M2 for 6-7 weeks followed by consolidation treatment with immunotherapy if the patient has no evidence for disease progression after the induction phase. I discussed with the patient the adverse effect of this treatment including but not limited to alopecia, myelosuppression, nausea and vomiting, peripheral neuropathy, liver or renal dysfunction. We will refer him to radiation oncology for evaluation and discussion of the radiotherapy option. He is expected to start the first dose of his chemotherapy on January 24, 2021. He will have a chemotherapy education class before the first dose of his treatment. We will call his pharmacy with prescription for Compazine 10 mg p.o. every 6 hours as needed for nausea. The patient will come back for follow-up visit on January 31, 2021 and then every 2 weeks during the course of his treatment. He was advised to call immediately if he has any other concerning symptoms in the interval. The total time spent in the appointment was 40 minutes.  Disclaimer: This note was dictated with voice recognition software. Similar sounding words  can inadvertently be transcribed and may be missed upon review. Eilleen Kempf, MD

## 2022-01-13 ENCOUNTER — Other Ambulatory Visit: Payer: Self-pay | Admitting: Internal Medicine

## 2022-01-13 ENCOUNTER — Encounter (HOSPITAL_COMMUNITY): Payer: Self-pay | Admitting: Emergency Medicine

## 2022-01-13 ENCOUNTER — Other Ambulatory Visit: Payer: Self-pay

## 2022-01-13 ENCOUNTER — Inpatient Hospital Stay: Payer: PPO | Attending: Internal Medicine | Admitting: Physician Assistant

## 2022-01-13 ENCOUNTER — Telehealth: Payer: Self-pay | Admitting: Radiation Oncology

## 2022-01-13 VITALS — BP 125/66 | HR 48 | Temp 97.0°F | Resp 19 | Ht 68.5 in | Wt 175.1 lb

## 2022-01-13 DIAGNOSIS — C3412 Malignant neoplasm of upper lobe, left bronchus or lung: Secondary | ICD-10-CM | POA: Diagnosis present

## 2022-01-13 DIAGNOSIS — J449 Chronic obstructive pulmonary disease, unspecified: Secondary | ICD-10-CM | POA: Diagnosis not present

## 2022-01-13 DIAGNOSIS — Z7189 Other specified counseling: Secondary | ICD-10-CM

## 2022-01-13 DIAGNOSIS — I6782 Cerebral ischemia: Secondary | ICD-10-CM | POA: Insufficient documentation

## 2022-01-13 DIAGNOSIS — K5900 Constipation, unspecified: Secondary | ICD-10-CM | POA: Insufficient documentation

## 2022-01-13 DIAGNOSIS — Z5111 Encounter for antineoplastic chemotherapy: Secondary | ICD-10-CM | POA: Insufficient documentation

## 2022-01-13 DIAGNOSIS — R0609 Other forms of dyspnea: Secondary | ICD-10-CM | POA: Insufficient documentation

## 2022-01-13 DIAGNOSIS — C3492 Malignant neoplasm of unspecified part of left bronchus or lung: Secondary | ICD-10-CM | POA: Diagnosis not present

## 2022-01-13 DIAGNOSIS — Z79899 Other long term (current) drug therapy: Secondary | ICD-10-CM | POA: Insufficient documentation

## 2022-01-13 LAB — CYTOLOGY - NON PAP

## 2022-01-13 MED ORDER — PROCHLORPERAZINE MALEATE 10 MG PO TABS: 10.0000 mg | ORAL_TABLET | Freq: Four times a day (QID) | ORAL | 2 refills | Status: DC | PRN

## 2022-01-13 NOTE — Telephone Encounter (Signed)
Called patient to schedule consultation with Dr. Sondra Come. No answer, LVM for return call.

## 2022-01-13 NOTE — Progress Notes (Signed)
ON PATHWAY REGIMEN - Non-Small Cell Lung  No Change  Continue With Treatment as Ordered.  Original Decision Date/Time: 12/27/2018 15:45     Administer weekly:     Paclitaxel      Carboplatin   **Always confirm dose/schedule in your pharmacy ordering system**  Patient Characteristics: Stage III - Unresectable, PS = 0, 1 AJCC T Category: T1b Current Disease Status: No Distant Mets or Local Recurrence AJCC N Category: N2 AJCC M Category: M0 AJCC 8 Stage Grouping: IIIA Performance Status: PS = 0, 1 Intent of Therapy: Curative Intent, Discussed with Patient

## 2022-01-13 NOTE — Patient Instructions (Addendum)
-  There are two main categories of lung cancer, they are named based on the size of the cancer cell. One is called Non-Small cell lung cancer. 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 Here are the the main points that were discussed at your office visit with [place] today:  -The treatment that you will receive consists of two chemotherapy drugs called Carboplatin and Paclitaxel (also called Taxol).  -We are planning on starting your treatment next week on 01/24/22 but before your start your treatment, I would like you to attend a Chemotherapy Education Class. This involves having you sit down with one of our nurse educators. She will discuss with you one-on-one more details about your treatment as well as general information about resources here at the [facility] treatment will be given every week for about 6 weeks or so (as long as you are also receiving radiation). We will check your labs (blood work) once a week . Dr. Julien Nordmann or I will see you every other treatment just to make sure that you are doing well and that everything is on track. -We will get a CT scan about 3 weeks after you complete your treatment  Medications:  -Compazine was sent to your pharmacy. This medication is for nausea. You may take this every 6 hours as needed if you feel nausous.   Referrals -I will place the referral to radiation oncology to meet with you to discuss starting radiation treatment. Please be on the lookout for a phone call from them to schedule a consultation.  -We also need a brain MRI just to make sure that no disease has spread to the brain. They will call you from the imaging department to schedule this. If you need to talk to them, their number is  [phone]  Follow Up:  -We will see you back for a follow up visit in 3 weeks before starting week 2 of treatment

## 2022-01-17 ENCOUNTER — Telehealth: Payer: Self-pay | Admitting: Physician Assistant

## 2022-01-17 NOTE — Telephone Encounter (Signed)
Scheduled per 02/09 los, patient has been called and notified of upcoming appointments. Patient's spouse will notify him.

## 2022-01-17 NOTE — Progress Notes (Signed)
Pharmacist Chemotherapy Monitoring - Initial Assessment    Anticipated start date: 01/24/22   The following has been reviewed per standard work regarding the patient's treatment regimen: The patient's diagnosis, treatment plan and drug doses, and organ/hematologic function Lab orders and baseline tests specific to treatment regimen  The treatment plan start date, drug sequencing, and pre-medications Prior authorization status  Patient's documented medication list, including drug-drug interaction screen and prescriptions for anti-emetics and supportive care specific to the treatment regimen The drug concentrations, fluid compatibility, administration routes, and timing of the medications to be used The patient's access for treatment and lifetime cumulative dose history, if applicable  The patient's medication allergies and previous infusion related reactions, if applicable   Changes made to treatment plan:  N/A  Follow up needed:  Pending authorization for treatment  Pt has elevated Scr 1.72  Benn Moulder, PharmD Pharmacy Resident  01/17/2022 9:15 AM

## 2022-01-18 ENCOUNTER — Encounter (HOSPITAL_COMMUNITY): Payer: Self-pay

## 2022-01-19 ENCOUNTER — Encounter: Payer: Self-pay | Admitting: *Deleted

## 2022-01-19 NOTE — Progress Notes (Signed)
Oncology Nurse Navigator Documentation  Oncology Nurse Navigator Flowsheets 01/19/2022 01/06/2022 01/05/2022 01/03/2022 12/28/2021 12/08/2021 12/03/2021  Confirmed Diagnosis Date - - - - - - -  Diagnosis Status - - - - Additional Work Up - -  Navigator Follow Up Date: 01/24/2022 - - 01/13/2022 01/03/2022 12/10/2021 12/07/2021  Navigator Follow Up Reason: Pathology - - Follow-up After Biopsy Appointment Review Appointment Review Appointment Review  Navigator Location [facility]  Referral Date to RadOnc/MedOnc - - - - - - -  Navigator Encounter Type Pathology Review Telephone Telephone Telephone Clinic/MDC;Follow-up Appt Clinic/[facility]C Other:  Telephone - Incoming Call [facility] Call Outgoing Call - - -  Patient Visit Type Other - - Other MedOnc MedOnc Other  Treatment Phase Other - - Abnormal Scans Abnormal Scans Abnormal Scans Pre-Tx/Tx Discussion  Barriers/Navigation Needs Coordination of Care/per Dr. Julien Nordmann, I contacted pathology team to send molecular and PDL 1 testing on Mr. Astrid Drafts path from 2020. Will follow up on results.  Coordination of Care;Education Coordination of Engineer, civil (consulting);Coordination of Care Education;Coordination of Care Coordination of Care  Education - Other Other Other Other Other -  Interventions Coordination of Care Coordination of Care;Psycho-Social Support;Education Coordination of Care;Education Coordination of Care;Education Coordination of Care;Education;Psycho-Social Support Coordination of Care;Education;Psycho-Social Support Coordination of Care  Acuity Level 2-Minimal Needs (1-2 Barriers Identified) Level 2-Minimal Needs (1-2 Barriers Identified) Level 2-Minimal Needs (1-2 Barriers Identified) Level 2-Minimal Needs (1-2 Barriers Identified) Level 3-Moderate Needs (3-4 Barriers Identified) Level 3-Moderate Needs (3-4 Barriers Identified)  Level 2-Minimal Needs (1-2 Barriers Identified)  Coordination of Care Pathology Appts Other Other Other Other -  Education Method - Verbal Verbal Verbal Verbal Verbal -  Time Spent with Patient 30 30 30 30 30  45 30

## 2022-01-20 ENCOUNTER — Ambulatory Visit (HOSPITAL_COMMUNITY)
Admission: RE | Admit: 2022-01-20 | Discharge: 2022-01-20 | Disposition: A | Discharge status: Home / Self Care | Payer: PPO | Source: Ambulatory Visit | Attending: Physician Assistant | Admitting: Physician Assistant

## 2022-01-20 DIAGNOSIS — C3492 Malignant neoplasm of unspecified part of left bronchus or lung: Secondary | ICD-10-CM

## 2022-01-20 MED ORDER — GADOBUTROL 1 MMOL/ML IV SOLN
7.5000 mL | Freq: Once | INTRAVENOUS | Status: AC | PRN
  Administered 2022-01-20: 7.5 mL via INTRAVENOUS

## 2022-01-21 ENCOUNTER — Telehealth: Payer: Self-pay | Admitting: Physician Assistant

## 2022-01-21 ENCOUNTER — Other Ambulatory Visit: Payer: PPO

## 2022-01-21 MED FILL — Dexamethasone Sodium Phosphate Inj 100 MG/10ML: INTRAMUSCULAR | Qty: 1 | Status: AC

## 2022-01-21 NOTE — Telephone Encounter (Signed)
I called the patient to let him know his brain MRI was normal. Unable to reach him. I left a voicemail that his scan was normal but if he wanted to discuss further or had further questions, to feel free to call us back.

## 2022-01-24 ENCOUNTER — Inpatient Hospital Stay: Payer: PPO

## 2022-01-24 ENCOUNTER — Other Ambulatory Visit: Payer: Self-pay

## 2022-01-24 ENCOUNTER — Other Ambulatory Visit: Payer: PPO

## 2022-01-24 VITALS — BP 135/85 | HR 50 | Temp 97.6°F | Resp 18 | Wt 179.1 lb

## 2022-01-24 DIAGNOSIS — Z5111 Encounter for antineoplastic chemotherapy: Secondary | ICD-10-CM | POA: Diagnosis not present

## 2022-01-24 DIAGNOSIS — C3492 Malignant neoplasm of unspecified part of left bronchus or lung: Secondary | ICD-10-CM

## 2022-01-24 LAB — CBC WITH DIFFERENTIAL (CANCER CENTER ONLY)
Abs Immature Granulocytes: 0.02 10*3/uL (ref 0.00–0.07)
Basophils Absolute: 0.1 10*3/uL (ref 0.0–0.1)
Basophils Relative: 1 %
Eosinophils Absolute: 0.1 10*3/uL (ref 0.0–0.5)
Eosinophils Relative: 2 %
HCT: 41.4 % (ref 39.0–52.0)
Hemoglobin: 14.4 g/dL (ref 13.0–17.0)
Immature Granulocytes: 0 %
Lymphocytes Relative: 30 %
Lymphs Abs: 2 10*3/uL (ref 0.7–4.0)
MCH: 32.2 pg (ref 26.0–34.0)
MCHC: 34.8 g/dL (ref 30.0–36.0)
MCV: 92.6 fL (ref 80.0–100.0)
Monocytes Absolute: 0.6 10*3/uL (ref 0.1–1.0)
Monocytes Relative: 9 %
Neutro Abs: 4 10*3/uL (ref 1.7–7.7)
Neutrophils Relative %: 58 %
Platelet Count: 143 10*3/uL — ABNORMAL LOW (ref 150–400)
RBC: 4.47 MIL/uL (ref 4.22–5.81)
RDW: 13.1 % (ref 11.5–15.5)
WBC Count: 6.7 10*3/uL (ref 4.0–10.5)
nRBC: 0 % (ref 0.0–0.2)

## 2022-01-24 LAB — CMP (CANCER CENTER ONLY)
ALT: 18 U/L (ref 0–44)
AST: 15 U/L (ref 15–41)
Albumin: 4.2 g/dL (ref 3.5–5.0)
Alkaline Phosphatase: 62 U/L (ref 38–126)
Anion gap: 5 (ref 5–15)
BUN: 13 mg/dL (ref 8–23)
CO2: 27 mmol/L (ref 22–32)
Calcium: 9.3 mg/dL (ref 8.9–10.3)
Chloride: 107 mmol/L (ref 98–111)
Creatinine: 1.77 mg/dL — ABNORMAL HIGH (ref 0.61–1.24)
GFR, Estimated: 39 mL/min — ABNORMAL LOW (ref >60)
Glucose, Bld: 102 mg/dL — ABNORMAL HIGH (ref 70–99)
Potassium: 4.5 mmol/L (ref 3.5–5.1)
Sodium: 139 mmol/L (ref 135–145)
Total Bilirubin: 0.7 mg/dL (ref 0.3–1.2)
Total Protein: 7 g/dL (ref 6.5–8.1)

## 2022-01-24 MED ORDER — DIPHENHYDRAMINE HCL 50 MG/ML IJ SOLN
50.0000 mg | Freq: Once | INTRAMUSCULAR | Status: AC
  Administered 2022-01-24: 50 mg via INTRAVENOUS
  Filled 2022-01-24: qty 1

## 2022-01-24 MED ORDER — SODIUM CHLORIDE 0.9 % IV SOLN
45.0000 mg/m2 | Freq: Once | INTRAVENOUS | Status: AC
  Administered 2022-01-24: 90 mg via INTRAVENOUS
  Filled 2022-01-24: qty 15

## 2022-01-24 MED ORDER — SODIUM CHLORIDE 0.9 % IV SOLN: Freq: Once | INTRAVENOUS | Status: AC

## 2022-01-24 MED ORDER — PALONOSETRON HCL INJECTION 0.25 MG/5ML
0.2500 mg | Freq: Once | INTRAVENOUS | Status: AC
  Administered 2022-01-24: 0.25 mg via INTRAVENOUS
  Filled 2022-01-24: qty 5

## 2022-01-24 MED ORDER — FAMOTIDINE IN NACL 20-0.9 MG/50ML-% IV SOLN
20.0000 mg | Freq: Once | INTRAVENOUS | Status: AC
  Administered 2022-01-24: 20 mg via INTRAVENOUS
  Filled 2022-01-24: qty 50

## 2022-01-24 MED ORDER — SODIUM CHLORIDE 0.9 % IV SOLN
10.0000 mg | Freq: Once | INTRAVENOUS | Status: AC
  Administered 2022-01-24: 10 mg via INTRAVENOUS
  Filled 2022-01-24: qty 10

## 2022-01-24 MED ORDER — SODIUM CHLORIDE 0.9 % IV SOLN
132.0000 mg | Freq: Once | INTRAVENOUS | Status: AC
  Administered 2022-01-24: 130 mg via INTRAVENOUS
  Filled 2022-01-24: qty 13

## 2022-01-24 NOTE — Patient Instructions (Signed)
[facility]COL[facility]  Discharge Instructions: Thank you for choosing [facility] to provide your oncology and hematology care.   If you have a lab appointment with the [facility], please go directly to the [facility] and check in at the registration area.   Wear comfortable clothing and clothing appropriate for easy access to any Portacath or PICC line.   We strive to give you quality time with your provider. You may need to reschedule your appointment if you arrive late (15 or more minutes).  Arriving late affects you and other patients whose appointments are after yours.  Also, if you miss three or more appointments without notifying the office, you may be dismissed from the clinic at the providers discretion.      For prescription refill requests, have your pharmacy contact our office and allow 72 hours for refills to be completed.    Today you received the following chemotherapy and/or immunotherapy agents: Carboplatin, Paclitaxel.      To help prevent nausea and vomiting after your treatment, we encourage you to take your nausea medication as directed.  BELOW ARE SYMPTOMS THAT SHOULD BE REPORTED IMMEDIATELY: *FEVER GREATER THAN 100.4 F (38 C) OR HIGHER *CHILLS OR SWEATING *NAUSEA AND VOMITING THAT IS NOT CONTROLLED WITH YOUR NAUSEA MEDICATION *UNUSUAL SHORTNESS OF BREATH *UNUSUAL BRUISING OR BLEEDING *URINARY PROBLEMS (pain or burning when urinating, or frequent urination) *BOWEL PROBLEMS (unusual diarrhea, constipation, pain near the anus) TENDERNESS IN MOUTH AND THROAT WITH OR WITHOUT PRESENCE OF ULCERS (sore throat, sores in mouth, or a toothache) UNUSUAL RASH, SWELLING OR PAIN  UNUSUAL VAGINAL DISCHARGE OR ITCHING   Items with * indicate a potential emergency and should be followed up as soon as possible or go to the Emergency Department if any problems should occur.  Please show the CHEMOTHERAPY ALERT CARD or IMMUNOTHERAPY ALERT CARD  at check-in to the Emergency Department and triage nurse.  Should you have questions after your visit or need to cancel or reschedule your appointment, please contact [facility]  Dept: [phone]  and follow the prompts.  Office hours are 8:00 a.m. to 4:30 p.m. Monday - Friday. Please note that voicemails left after 4:00 p.m. may not be returned until the following business day.  We are closed weekends and major holidays. You have access to a nurse at all times for urgent questions. Please call the main number to the clinic Dept: [phone] and follow the prompts.   For any non-urgent questions, you may also contact your provider using MyChart. We now offer e-Visits for anyone 48 and older to request care online for non-urgent symptoms. For details visit [email or web address].   Also download the MyChart app! Go to the app store, search "MyChart", open the app, select [facility], and log in with your MyChart username and password.  Due to Covid, a mask is required upon entering the hospital/clinic. If you do not have a mask, one will be given to you upon arrival. For doctor visits, patients may have 1 support person aged 47 or older with them. For treatment visits, patients cannot have anyone with them due to current Covid guidelines and our immunocompromised population.   Carboplatin injection What is this medication? CARBOPLATIN (KAR boe pla tin) is a chemotherapy drug. It targets fast dividing cells, like cancer cells, and causes these cells to die. This medicine is used to treat ovarian cancer and many other cancers. This medicine may be used for other  purposes; ask your health care provider or pharmacist if you have questions. COMMON BRAND NAME(S): Paraplatin What should I tell my care team before I take this medication? They need to know if you have any of these conditions: blood disorders hearing problems kidney disease recent or ongoing radiation  therapy an unusual or allergic reaction to carboplatin, cisplatin, other chemotherapy, other medicines, foods, dyes, or preservatives pregnant or trying to get pregnant breast-feeding How should I use this medication? This drug is usually given as an infusion into a vein. It is administered in a hospital or clinic by a specially trained health care professional. Talk to your pediatrician regarding the use of this medicine in children. Special care may be needed. Overdosage: If you think you have taken too much of this medicine contact a poison control center or emergency room at once. NOTE: This medicine is only for you. Do not share this medicine with others. What if I miss a dose? It is important not to miss a dose. Call your doctor or health care professional if you are unable to keep an appointment. What may interact with this medication? medicines for seizures medicines to increase blood counts like filgrastim, pegfilgrastim, sargramostim some antibiotics like amikacin, gentamicin, neomycin, streptomycin, tobramycin vaccines Talk to your doctor or health care professional before taking any of these medicines: acetaminophen aspirin ibuprofen ketoprofen naproxen This list may not describe all possible interactions. Give your health care provider a list of all the medicines, herbs, non-prescription drugs, or dietary supplements you use. Also tell them if you smoke, drink alcohol, or use illegal drugs. Some items may interact with your medicine. What should I watch for while using this medication? Your condition will be monitored carefully while you are receiving this medicine. You will need important blood work done while you are taking this medicine. This drug may make you feel generally unwell. This is not uncommon, as chemotherapy can affect healthy cells as well as cancer cells. Report any side effects. Continue your course of treatment even though you feel ill unless your doctor tells  you to stop. In some cases, you may be given additional medicines to help with side effects. Follow all directions for their use. Call your doctor or health care professional for advice if you get a fever, chills or sore throat, or other symptoms of a cold or flu. Do not treat yourself. This drug decreases your body's ability to fight infections. Try to avoid being around people who are sick. This medicine may increase your risk to bruise or bleed. Call your doctor or health care professional if you notice any unusual bleeding. Be careful brushing and flossing your teeth or using a toothpick because you may get an infection or bleed more easily. If you have any dental work done, tell your dentist you are receiving this medicine. Avoid taking products that contain aspirin, acetaminophen, ibuprofen, naproxen, or ketoprofen unless instructed by your doctor. These medicines may hide a fever. Do not become pregnant while taking this medicine. Women should inform their doctor if they wish to become pregnant or think they might be pregnant. There is a potential for serious side effects to an unborn child. Talk to your health care professional or pharmacist for more information. Do not breast-feed an infant while taking this medicine. What side effects may I notice from receiving this medication? Side effects that you should report to your doctor or health care professional as soon as possible: allergic reactions like skin rash, itching or hives,  swelling of the face, lips, or tongue signs of infection - fever or chills, cough, sore throat, pain or difficulty passing urine signs of decreased platelets or bleeding - bruising, pinpoint red spots on the skin, black, tarry stools, nosebleeds signs of decreased red blood cells - unusually weak or tired, fainting spells, lightheadedness breathing problems changes in hearing changes in vision chest pain high blood pressure low blood counts - This drug may  decrease the number of white blood cells, red blood cells and platelets. You may be at increased risk for infections and bleeding. nausea and vomiting pain, swelling, redness or irritation at the injection site pain, tingling, numbness in the hands or feet problems with balance, talking, walking trouble passing urine or change in the amount of urine Side effects that usually do not require medical attention (report to your doctor or health care professional if they continue or are bothersome): hair loss loss of appetite metallic taste in the mouth or changes in taste This list may not describe all possible side effects. Call your doctor for medical advice about side effects. You may report side effects to FDA at 1-800-FDA-1088. Where should I keep my medication? This drug is given in a hospital or clinic and will not be stored at home. NOTE: This sheet is a summary. It may not cover all possible information. If you have questions about this medicine, talk to your doctor, pharmacist, or health care provider.  2022 Elsevier/Gold Standard (2008-04-30 00:00:00)  Paclitaxel injection What is this medication? PACLITAXEL (PAK li TAX el) is a chemotherapy drug. It targets fast dividing cells, like cancer cells, and causes these cells to die. This medicine is used to treat ovarian cancer, breast cancer, lung cancer, Kaposi's sarcoma, and other cancers. This medicine may be used for other purposes; ask your health care provider or pharmacist if you have questions. COMMON BRAND NAME(S): Onxol, Taxol What should I tell my care team before I take this medication? They need to know if you have any of these conditions: history of irregular heartbeat liver disease low blood counts, like low white cell, platelet, or red cell counts lung or breathing disease, like asthma tingling of the fingers or toes, or other nerve disorder an unusual or allergic reaction to paclitaxel, alcohol, polyoxyethylated castor  oil, other chemotherapy, other medicines, foods, dyes, or preservatives pregnant or trying to get pregnant breast-feeding How should I use this medication? This drug is given as an infusion into a vein. It is administered in a hospital or clinic by a specially trained health care professional. Talk to your pediatrician regarding the use of this medicine in children. Special care may be needed. Overdosage: If you think you have taken too much of this medicine contact a poison control center or emergency room at once. NOTE: This medicine is only for you. Do not share this medicine with others. What if I miss a dose? It is important not to miss your dose. Call your doctor or health care professional if you are unable to keep an appointment. What may interact with this medication? Do not take this medicine with any of the following medications: live virus vaccines This medicine may also interact with the following medications: antiviral medicines for hepatitis, HIV or AIDS certain antibiotics like erythromycin and clarithromycin certain medicines for fungal infections like ketoconazole and itraconazole certain medicines for seizures like carbamazepine, phenobarbital, phenytoin gemfibrozil nefazodone rifampin St. John's wort This list may not describe all possible interactions. Give your health care provider a  list of all the medicines, herbs, non-prescription drugs, or dietary supplements you use. Also tell them if you smoke, drink alcohol, or use illegal drugs. Some items may interact with your medicine. What should I watch for while using this medication? Your condition will be monitored carefully while you are receiving this medicine. You will need important blood work done while you are taking this medicine. This medicine can cause serious allergic reactions. To reduce your risk you will need to take other medicine(s) before treatment with this medicine. If you experience allergic reactions  like skin rash, itching or hives, swelling of the face, lips, or tongue, tell your doctor or health care professional right away. In some cases, you may be given additional medicines to help with side effects. Follow all directions for their use. This drug may make you feel generally unwell. This is not uncommon, as chemotherapy can affect healthy cells as well as cancer cells. Report any side effects. Continue your course of treatment even though you feel ill unless your doctor tells you to stop. Call your doctor or health care professional for advice if you get a fever, chills or sore throat, or other symptoms of a cold or flu. Do not treat yourself. This drug decreases your body's ability to fight infections. Try to avoid being around people who are sick. This medicine may increase your risk to bruise or bleed. Call your doctor or health care professional if you notice any unusual bleeding. Be careful brushing and flossing your teeth or using a toothpick because you may get an infection or bleed more easily. If you have any dental work done, tell your dentist you are receiving this medicine. Avoid taking products that contain aspirin, acetaminophen, ibuprofen, naproxen, or ketoprofen unless instructed by your doctor. These medicines may hide a fever. Do not become pregnant while taking this medicine. Women should inform their doctor if they wish to become pregnant or think they might be pregnant. There is a potential for serious side effects to an unborn child. Talk to your health care professional or pharmacist for more information. Do not breast-feed an infant while taking this medicine. Men are advised not to father a child while receiving this medicine. This product may contain alcohol. Ask your pharmacist or healthcare provider if this medicine contains alcohol. Be sure to tell all healthcare providers you are taking this medicine. Certain medicines, like metronidazole and disulfiram, can cause an  unpleasant reaction when taken with alcohol. The reaction includes flushing, headache, nausea, vomiting, sweating, and increased thirst. The reaction can last from 30 minutes to several hours. What side effects may I notice from receiving this medication? Side effects that you should report to your doctor or health care professional as soon as possible: allergic reactions like skin rash, itching or hives, swelling of the face, lips, or tongue breathing problems changes in vision fast, irregular heartbeat high or low blood pressure mouth sores pain, tingling, numbness in the hands or feet signs of decreased platelets or bleeding - bruising, pinpoint red spots on the skin, black, tarry stools, blood in the urine signs of decreased red blood cells - unusually weak or tired, feeling faint or lightheaded, falls signs of infection - fever or chills, cough, sore throat, pain or difficulty passing urine signs and symptoms of liver injury like dark yellow or brown urine; general ill feeling or flu-like symptoms; light-colored stools; loss of appetite; nausea; right upper belly pain; unusually weak or tired; yellowing of the eyes or skin swelling  of the ankles, feet, hands unusually slow heartbeat Side effects that usually do not require medical attention (report to your doctor or health care professional if they continue or are bothersome): diarrhea hair loss loss of appetite muscle or joint pain nausea, vomiting pain, redness, or irritation at site where injected tiredness This list may not describe all possible side effects. Call your doctor for medical advice about side effects. You may report side effects to FDA at 1-800-FDA-1088. Where should I keep my medication? This drug is given in a hospital or clinic and will not be stored at home. NOTE: This sheet is a summary. It may not cover all possible information. If you have questions about this medicine, talk to your doctor, pharmacist, or  health care provider.  2022 Elsevier/Gold Standard (2021-08-10 00:00:00)

## 2022-01-24 NOTE — Progress Notes (Signed)
Pt declined ice with first dose of paclitaxel

## 2022-01-24 NOTE — Progress Notes (Signed)
Per Julien Nordmann MD, ok to proceed with treatment today with SCR 1.77.

## 2022-01-25 ENCOUNTER — Telehealth: Payer: Self-pay

## 2022-01-25 ENCOUNTER — Encounter: Payer: Self-pay | Admitting: Emergency Medicine

## 2022-01-25 ENCOUNTER — Ambulatory Visit: Payer: PPO | Admitting: Emergency Medicine

## 2022-01-25 DIAGNOSIS — J449 Chronic obstructive pulmonary disease, unspecified: Secondary | ICD-10-CM

## 2022-01-25 DIAGNOSIS — C3492 Malignant neoplasm of unspecified part of left bronchus or lung: Secondary | ICD-10-CM | POA: Diagnosis not present

## 2022-01-25 NOTE — Progress Notes (Signed)
Subjective:    Patient ID: Jack Taylor, male    DOB: 01-Oct-1946, 76 y.o.   MRN: [id number]  HPI 76 year old gentleman was in severe COPD on pulmonary function testing. History stage III lung cancer status post lingular resection and recently followed by Dr. Julien Nordmann after abnormal PET scan.   Surveillance PET scan 12/02/2021 performed by Dr. Kipp Brood reviewed by me, shows an enlarging AP window lymph node 10 mm that is hypermetabolic, low-level hypermetabolic activity along the posterior aspect of the left hilum without any discrete lymphadenopathy in that area, mildly prominent right axillary node  ROV 01/25/22 --Jack Taylor with a history of severe COPD.  He also has adenocarcinoma post left lingulectomy, initially stage Ia but upstaged based on newly identified lymphadenopathy in the AP window.  I performed EBUS on 01/10/2022 and the 4L node was positive for adenocarcinoma.  He just had his first chemotherapy with Dr. Julien Nordmann. Paclitaxel, carboplatin.  He is on Anoro, uses albuterol approximately 1-2x a week He is nervous, otherwise feels ok. No cough or SOB. He acknowledges that he is not very active.    Review of Systems As per HPI  Past Medical History:  Diagnosis Date   Adenomatous colon polyp    Anxiety    Blood transfusion without reported diagnosis    Cancer ([facility])    Lung; 11/2018   CKD (chronic kidney disease)    stage III 11/09/18   COPD (chronic obstructive pulmonary disease) (HCC)    Gastric ulcer    GERD (gastroesophageal reflux disease)    High cholesterol    Hypertension    Pre-diabetes    A1c 6.2% 11/09/18     Family History  Problem Relation Age of Onset   Stomach cancer Mother    Heart disease Father    Colon cancer Neg Hx      Social History   Socioeconomic History   Marital status: Married    Spouse name: Not on file   Number of children: Not on file   Years of education: Not on file   Highest education level: Not on file  Occupational  History   Not on file  Tobacco Use   Smoking status: Former    Packs/day: 0.25    Years: 5.00    Pack years: 1.25    Types: Cigarettes    Quit date: 11/04/2018    Years since quitting: 3.2   Smokeless tobacco: Never   Tobacco comments:    recently quit when diagnosed with lung cancer.   Vaping Use   Vaping Use: Never used  Substance and Sexual Activity   Alcohol use: No   Drug use: No   Sexual activity: Not Currently  Other Topics Concern   Not on file  Social History Narrative   Not on file   Social Determinants of Health   Financial Resource Strain: Not on file  Food Insecurity: Not on file  Transportation Needs: Not on file  Physical Activity: Not on file  Stress: Not on file  Social Connections: Not on file  Intimate Partner Violence: Not on file     No Known Allergies   Outpatient Medications Prior to Visit  Medication Sig Dispense Refill   albuterol (PROVENTIL HFA;VENTOLIN HFA) 108 (90 Base) MCG/ACT inhaler Inhale 2 puffs into the lungs every 6 (six) hours as needed for wheezing or shortness of breath. Reported on 06/02/2016     amLODipine (NORVASC) 10 MG tablet Take 10 mg by mouth daily.  esomeprazole (NEXIUM) 40 MG capsule Take 40 mg by mouth daily.      losartan (COZAAR) 50 MG tablet Take 50 mg by mouth daily.      prochlorperazine (COMPAZINE) 10 MG tablet Take 1 tablet (10 mg total) by mouth every 6 (six) hours as needed. 30 tablet 2   rosuvastatin (CRESTOR) 10 MG tablet Take 10 mg by mouth daily.     Tiotropium Bromide-Olodaterol 2.5-2.5 MCG/ACT AERS Inhale 1 puff into the lungs at bedtime. Anoro     traZODone (DESYREL) 50 MG tablet Take 50 mg by mouth at bedtime.     No facility-administered medications prior to visit.         Objective:   Physical Exam Vitals:   01/25/22 1125  BP: 118/68  Pulse: 69  Temp: 97.8 F (36.6 C)  TempSrc: Oral  SpO2: 94%  Weight: 183 lb (83 kg)  Height: 5\' 8"  (1.727 m)   Gen: [facility], well-nourished, in no  distress,  normal affect  ENT: No lesions,  mouth clear,  oropharynx clear, no postnasal drip  Neck: No JVD, no stridor  Lungs: No use of accessory muscles, no crackles or wheezing on normal respiration, no wheeze on forced expiration  Cardiovascular: RRR, heart sounds normal, no murmur or gallops, no peripheral edema  Musculoskeletal: No deformities, no cyanosis or clubbing  Neuro: alert, awake, non focal  Skin: Warm, no lesions or rash      Assessment & Plan:   Adenocarcinoma of left lung, stage 3 (HCC) Chemoradiation planned.  He just started chemo and is set to meet radiation oncology.  COPD (chronic obstructive pulmonary disease) (HCC) Stable respiratory status.  Plan to continue Anoro, albuterol as needed   Baltazar Apo, MD, PhD 01/25/2022, 11:53 AM [facility] [phone] or if no answer before 7:00PM call [phone] For any issues after 7:00PM please call eLink [phone]

## 2022-01-25 NOTE — Telephone Encounter (Signed)
-----   Message from Rafael Bihari, RN sent at 01/24/2022  4:54 PM EST ----- Regarding: Julien Nordmann First time Carboplatin, Paclitaxel Pt had first time Paclitaxel/Carboplatin on 2/20. Tolerated well. Needs call back

## 2022-01-25 NOTE — Assessment & Plan Note (Signed)
Chemoradiation planned.  He just started chemo and is set to meet radiation oncology.

## 2022-01-25 NOTE — Patient Instructions (Signed)
Follow with oncology and radiation oncology as planned Continue Anoro 1 inhalation once daily. Keep your albuterol available to use 2 puffs when you needed for shortness of breath, chest tightness, wheezing. Follow with Dr. Lamonte Sakai in 12 months or sooner if you have any problems.

## 2022-01-25 NOTE — Assessment & Plan Note (Signed)
Stable respiratory status.  Plan to continue Anoro, albuterol as needed

## 2022-01-25 NOTE — Telephone Encounter (Signed)
Called Jack Taylor to see how he was doing since his treatment 01-24-22. LM  for Jack Taylor to call Dr. Worthy Flank office at [phone] if he is having concerns or issues since his first treatment yesterday.

## 2022-01-26 NOTE — Progress Notes (Signed)
Location of tumor and Histology per Pathology Report: Recurrent non-small cell lung cancer initially diagnosed as stage IA (T1b, N0, M0) non-small cell lung cancer, adenocarcinoma presented with left upper lobe lung nodule diagnosed in January 2020.  The patient has evidence for disease recurrence in AP window lymphadenopathy in December 2022.  Biopsy:  A. LYMPH NODE, 4L, FINE NEEDLE ASPIRATION:  - Malignant cells consistent with non-small cell carcinoma   Past/Anticipated interventions by surgeon, if any:   Date of Operation: 01/10/2022 Surgeon: Baltazar Apo Operation: Flexible video fiberoptic bronchoscopy with endobronchial ultrasound and biopsies.  Past/Anticipated interventions by medical oncology, if any: Dr. Julien Nordmann discussed that if this is consistent with non-small cell lung cancer, adenocarcinoma he would recommend concurrent chemoradiation with carboplatin for an AUC of 2 and paclitaxel 45 mg/m2. The patient is interested in this option and is expected to start this 01/24/22.     Pain issues, if any:  no   SAFETY ISSUES: Prior radiation? no Pacemaker/ICD? no Possible current pregnancy? no Is the patient on methotrexate? no  Current Complaints / other details:  shortness of breath with exertion     Vitals:   01/31/22 1308  BP: (!) 98/43  Pulse: (!) 52  Resp: 20  Temp: 97.7 F (36.5 C)  SpO2: 96%  Weight: 180 lb 3.2 oz (81.7 kg)  Height: 5\' 8"  (1.727 m)

## 2022-01-28 NOTE — Progress Notes (Signed)
[name] OFFICE PROGRESS NOTE  Jack Major, MD [address] [place] [address]  DIAGNOSIS: Recurrent non-small cell lung cancer initially diagnosed as stage IA (T1b, N0, M0) non-small cell lung cancer, adenocarcinoma presented with left upper lobe lung nodule diagnosed in January 2020.  The patient has evidence for disease recurrence in AP window lymphadenopathy in December 2022.  PDL1: pending  PRIOR THERAPY: Status post left lingulectomy with lymph node dissection under the care of Dr. Servando Snare on January 01, 2019   CURRENT THERAPY: Concurrent chemoradiation with carboplatin for an AUC of 2 and paclitaxel 45 mg per metered squared weekly.  First dose on 01/24/2022.  Status post 1 cycle.   INTERVAL HISTORY: Jack Taylor 76 y.o. male returns to the clinic today for a follow-up visit accompanied by his wife.  The patient is feeling fairly well today without any concerning complaints.  The patient recently was found to have recurrent non-small cell lung cancer, adenocarcinoma.  Therefore, the patient is currently undergoing treatment with weekly concurrent chemoradiation.  He status post 1 cycle of chemotherapy and tolerated well without any concerning adverse side effects.  He met with Dr. Sondra Come to discuss concurrent radiation yesterday. He has his simulation scheduled for tomorrow.   The patient tolerated his first cycle of treatment well.  Denies any fever, chills, night sweats, or unexplained weight loss.  Denies any cough, chest pain, or hemoptysis.  He denies significant dyspnea on exertion. Denies any nausea, vomiting, or diarrhea. He noticed he has been having bowel movements every other day as opposed to daily. He has not tried taking stool softeners. Denies any headache or visual changes.  The patient is here today for evaluation and repeat blood work before starting cycle #2.  MEDICAL HISTORY: Past Medical History:  Diagnosis Date   Adenomatous colon  polyp    Anxiety    Blood transfusion without reported diagnosis    Cancer ([place])    Lung; 11/2018   CKD (chronic kidney disease)    stage III 11/09/18   COPD (chronic obstructive pulmonary disease) (HCC)    Gastric ulcer    GERD (gastroesophageal reflux disease)    High cholesterol    Hypertension    Pre-diabetes    A1c 6.2% 11/09/18    ALLERGIES:  has No Known Allergies.  MEDICATIONS:  Current Outpatient Medications  Medication Sig Dispense Refill   albuterol (PROVENTIL HFA;VENTOLIN HFA) 108 (90 Base) MCG/ACT inhaler Inhale 2 puffs into the lungs every 6 (six) hours as needed for wheezing or shortness of breath. Reported on 06/02/2016     amLODipine (NORVASC) 10 MG tablet Take 10 mg by mouth daily.      esomeprazole (NEXIUM) 40 MG capsule Take 40 mg by mouth daily.      losartan (COZAAR) 50 MG tablet Take 50 mg by mouth daily.      prochlorperazine (COMPAZINE) 10 MG tablet Take 1 tablet (10 mg total) by mouth every 6 (six) hours as needed. 30 tablet 2   rosuvastatin (CRESTOR) 10 MG tablet Take 10 mg by mouth daily.     Tiotropium Bromide-Olodaterol 2.5-2.5 MCG/ACT AERS Inhale 1 puff into the lungs at bedtime. Anoro     traZODone (DESYREL) 50 MG tablet Take 50 mg by mouth at bedtime.     No current facility-administered medications for this visit.    SURGICAL HISTORY:  Past Surgical History:  Procedure Laterality Date   BRONCHIAL NEEDLE ASPIRATION BIOPSY  01/10/2022   Procedure:  BRONCHIAL NEEDLE ASPIRATION BIOPSIES;  Surgeon: Collene Gobble, MD;  Location: [facility] ENDOSCOPY;  Service: Cardiopulmonary;;   COLONOSCOPY  2008   ESOPHAGOGASTRODUODENOSCOPY  11/23/2011   Procedure: ESOPHAGOGASTRODUODENOSCOPY (EGD);  Surgeon: Owens Loffler, MD;  Location: [facility] ENDOSCOPY;  Service: Endoscopy;  Laterality: N/A;   INTERCOSTAL NERVE BLOCK Left 01/01/2019   Procedure: INTERCOSTAL NERVE BLOCK;  Surgeon: Grace Isaac, MD;  Location: [place];  Service: Thoracic;  Laterality: Left;   LUNG BIOPSY   12/18/2018   Procedure: LUNG BIOPSY - of Left Upper Lobe Biopsy of #7 Node Biopsy of #10L Node;  Surgeon: Grace Isaac, MD;  Location: [place];  Service: Thoracic;;   LYMPH NODE DISSECTION Left 01/01/2019   Procedure: LYMPH NODE DISSECTION;  Surgeon: Grace Isaac, MD;  Location: [place];  Service: Thoracic;  Laterality: Left;   UPPER GASTROINTESTINAL ENDOSCOPY  11/23/11   VIDEO ASSISTED THORACOSCOPY (VATS)/WEDGE RESECTION Left 01/01/2019   Procedure: VIDEO ASSISTED THORACOSCOPY (VATS)/ LEFT LINGULECTOMY;  Surgeon: Grace Isaac, MD;  Location: [place];  Service: Thoracic;  Laterality: Left;   VIDEO BRONCHOSCOPY WITH ENDOBRONCHIAL NAVIGATION N/A 12/18/2018   Procedure: VIDEO BRONCHOSCOPY WITH ENDOBRONCHIAL NAVIGATION with Placement of Fiducial Markers x 3;  Surgeon: Grace Isaac, MD;  Location: [place];  Service: Thoracic;  Laterality: N/A;   VIDEO BRONCHOSCOPY WITH ENDOBRONCHIAL ULTRASOUND N/A 12/18/2018   Procedure: VIDEO BRONCHOSCOPY WITH ENDOBRONCHIAL ULTRASOUND;  Surgeon: Grace Isaac, MD;  Location: [place];  Service: Thoracic;  Laterality: N/A;   VIDEO BRONCHOSCOPY WITH ENDOBRONCHIAL ULTRASOUND Left 01/10/2022   Procedure: VIDEO BRONCHOSCOPY WITH ENDOBRONCHIAL ULTRASOUND;  Surgeon: Collene Gobble, MD;  Location: [place];  Service: Cardiopulmonary;  Laterality: Left;    REVIEW OF SYSTEMS:   Review of Systems  Constitutional: Negative for appetite change, chills, fatigue, fever and unexpected weight change.  HENT:   Negative for mouth sores, nosebleeds, sore throat and trouble swallowing.   Eyes: Negative for eye problems and icterus.  Respiratory: Negative for cough, hemoptysis, shortness of breath and wheezing.   Cardiovascular: Negative for chest pain and leg swelling.  Gastrointestinal: Positive for mild constipation. Negative for abdominal pain, diarrhea, nausea and vomiting.  Genitourinary: Negative for bladder incontinence, difficulty urinating, dysuria, frequency  and hematuria.   Musculoskeletal: Negative for back pain, gait problem, neck pain and neck stiffness.  Skin: Negative for itching and rash.  Neurological: Negative for dizziness, extremity weakness, gait problem, headaches, light-headedness and seizures.  Hematological: Negative for adenopathy. Does not bruise/bleed easily.  Psychiatric/Behavioral: Negative for confusion, depression and sleep disturbance. The patient is not nervous/anxious.     PHYSICAL EXAMINATION:  Blood pressure 127/67, pulse (!) 45, temperature 97.6 F (36.4 C), temperature source Oral, resp. rate 18, height '5\' 8"'  (1.727 m), weight 179 lb 1.6 oz (81.2 kg), SpO2 97 %.  ECOG PERFORMANCE STATUS: 1  Physical Exam  Constitutional: Oriented to person, place, and time and well-developed, well-nourished, and in no distress. HENT:  Head: Normocephalic and atraumatic. Hard of hearing.  Mouth/Throat: Oropharynx is clear and moist. No oropharyngeal exudate.  Eyes: Conjunctivae are normal. Right eye exhibits no discharge. Left eye exhibits no discharge. No scleral icterus.  Neck: Normal range of motion. Neck supple.  Cardiovascular: Bradycardic, regular rhythm, normal heart sounds and intact distal pulses.   Pulmonary/Chest: Effort normal. Quiet breath sounds in all lung fields. No respiratory distress. No wheezes. No rales.  Abdominal: Soft. Bowel sounds are normal. Exhibits no distension and no mass. There is no tenderness.  Musculoskeletal: Normal  range of motion. Exhibits no edema.  Lymphadenopathy:    No cervical adenopathy.  Neurological: Alert and oriented to person, place, and time. Exhibits normal muscle tone. Gait normal. Coordination normal.  Skin: Skin is warm and dry. No rash noted. Not diaphoretic. No erythema. No pallor.  Psychiatric: Mood, memory and judgment normal.  Vitals reviewed.  LABORATORY DATA: Lab Results  Component Value Date   WBC 5.7 02/01/2022   HGB 12.9 (L) 02/01/2022   HCT 37.0 (L)  02/01/2022   MCV 92.7 02/01/2022   PLT 149 (L) 02/01/2022      Chemistry      Component Value Date/Time   NA 139 02/01/2022 0856   K 4.4 02/01/2022 0856   CL 107 02/01/2022 0856   CO2 27 02/01/2022 0856   BUN 18 02/01/2022 0856   CREATININE 1.74 (H) 02/01/2022 0856      Component Value Date/Time   CALCIUM 8.9 02/01/2022 0856   ALKPHOS 57 02/01/2022 0856   AST 15 02/01/2022 0856   ALT 21 02/01/2022 0856   BILITOT 0.6 02/01/2022 0856       RADIOGRAPHIC STUDIES:  MR Brain W Wo Contrast  Result Date: 01/21/2022 CLINICAL DATA:  Adenocarcinoma of the lung.  Intracranial staging. EXAM: MRI HEAD WITHOUT AND WITH CONTRAST TECHNIQUE: Multiplanar, multiecho pulse sequences of the brain and surrounding structures were obtained without and with intravenous contrast. CONTRAST:  7.35m GADAVIST GADOBUTROL 1 MMOL/ML IV SOLN COMPARISON:  None. FINDINGS: Brain: No abnormal swelling or enhancement. Cystic space in the deep left cerebellum with rim of gliosis and central CSF signal. No superimposed enhancement, appearance consistent with chronic lacune. Mild chronic small vessel ischemia in the supratentorial white matter. No acute or subacute infarct, hemorrhage, hydrocephalus, or collection. Age normal brain volume Vascular: Taylor flow voids and vascular enhancements are preserved. Skull and upper cervical spine: Negative for lesion. Sinuses/Orbits: Negative IMPRESSION: Negative for metastatic disease to the brain. Electronically Signed   By: JJorje GuildM.D.   On: 01/21/2022 07:49     ASSESSMENT/PLAN:  This is a very pleasant 76year old Caucasian male with a history of stage IA (T1b, N0, M0) non-small cell lung cancer, adenocarcinoma. He presented with  left upper lobe lung nodule in January 2020.  The patient has evidence for disease recurrence in AP window lymphadenopathy in December 2022.  He is status post left lingulectomy with lymph node dissection under the care of Dr. GServando Snareon January 01, 2019.   His pdl1 expression is pending.   The patient had been in observation since that time but recent CT scan of the chest followed by a PET scan in December 2022 showed concerning findings of disease recurrence with enlarging and hypermetabolic AP window lymph node.   The patient underwent bronchoscopy and EBUS under the care of Dr. BLamonte Sakaion 01/10/22.  The final pathology was consistent with recurrent non-small cell lung cancer, adenocarcinoma.  Therefore, the patient is currently undergoing treatment with concurrent chemoradiation.  The patient is undergoing carboplatin for an AUC of 2 and paclitaxel 45 mg per metered squared weekly.  He is status post 1 cycle.  He tolerated treatment well without any concerning adverse side effects.  Radiation is under the care of Dr. KSondra Come He is having his CT simulation tomorrow.    Labs were reviewed.  Recommend that he arrange for cycle #2 today as scheduled.  We will see him back for follow-up visit in 2 weeks for evaluation before starting cycle #4.  Advised  him to start using a stool softener to keep his bowel habits regular.   The patient was advised to call immediately if he has any concerning symptoms in the interval. The patient voices understanding of current disease status and treatment options and is in agreement with the current care plan. All questions were answered. The patient knows to call the clinic with any problems, questions or concerns. We can certainly see the patient much sooner if necessary    No orders of the defined types were placed in this encounter.    The total time spent in the appointment was 20-29 minutes.   Devan Danzer L Ginni Eichler, PA-C 02/01/22

## 2022-01-30 NOTE — Progress Notes (Signed)
Radiation Oncology         ([phone]) [phone] ________________________________  Initial Outpatient Consultation  Name: Jack Taylor MRN: [id number]  Date: 01/31/2022  DOB: 10-Jul-1946  XT:KWIOX, Jack Conroy, MD  Curt Bears, MD   REFERRING PHYSICIAN: Curt Bears, MD  DIAGNOSIS: Recurrent adenocarcinoma of the lung  Recurrent non-small cell lung cancer initially diagnosed as stage IA (T1b, N0, M0) non-small cell lung cancer, adenocarcinoma presented with left upper lobe lung nodule diagnosed in January 2020.  The patient has evidence for disease recurrence in AP window lymphadenopathy in December 2022.   HISTORY OF PRESENT ILLNESS::Jack Taylor is a 76 y.o. male who is accompanied by his wife. he is seen as a courtesy of Dr. Julien Nordmann for an opinion concerning radiation therapy as part of management for his recently diagnosed recurrent left lung cancer.  The patient was initially diagnosed with a stage IA non-small cell lung cancer, adenocarcinoma, in January 2020 under the care of Dr. Servando Snare.  He has been followed under observation Dr. Servando Snare since that time.    The patient was recently seen by Dr. Kipp Brood and had a repeat chest CT on 11/19/2021 which showed an increase in size of an AP window lymph node concerning for disease recurrence. PET scan performed on 12/02/21 further demonstrated the enlarging AP window lymph node as hypermetabolic and consistent with metastatic disease.  No other findings of metastatic disease, suspicious pulmonary nodularities, or abnormal metabolic activities were otherwise appreciated.  Subsequently, the patient was referred to Dr. Julien Nordmann for further evaluation. Following review of recent imaging, Dr. Julien Nordmann recommended a referral to Dr. Lamonte Sakai for consideration of bronchoscopy with EBUS and biopsy of the AP window lymph node for confirmation of tissue diagnosis. Dr. Julien Nordmann also ordered an MRI of the brain to complete the staging work-up.  Following  discussion with Dr. Lamonte Sakai, the patient opted to proceed with bronchoscopy and biopsies on 01/10/22. FNA of lymph node 4L revealed malignant cells consistent with non-small cell carcinoma. (Diagnostic comment further noted that the malignant cells are positive with TTF-1 and cytokeratin 7 and negative with cytokeratin 5/6 and p63, consistent with adenocarcinoma).  Per his most recent follow- up with Dr. Julien Nordmann on 01/13/22, treatment options were discussed with the patient and opted to proceed with concurrent chemoradiation consisting of carboplatin and paclitaxel. First dose on 01/24/22.   Pertinent imaging thus far includes an MRI of the brain on 01/20/22 which demonstrated no evidence of intracranial metastatic disease.   PREVIOUS RADIATION THERAPY: No  PAST MEDICAL HISTORY:  Past Medical History:  Diagnosis Date   Adenomatous colon polyp    Anxiety    Blood transfusion without reported diagnosis    Cancer ([place])    Lung; 11/2018   CKD (chronic kidney disease)    stage III 11/09/18   COPD (chronic obstructive pulmonary disease) (HCC)    Gastric ulcer    GERD (gastroesophageal reflux disease)    High cholesterol    Hypertension    Pre-diabetes    A1c 6.2% 11/09/18    PAST SURGICAL HISTORY: Past Surgical History:  Procedure Laterality Date   BRONCHIAL NEEDLE ASPIRATION BIOPSY  01/10/2022   Procedure: BRONCHIAL NEEDLE ASPIRATION BIOPSIES;  Surgeon: Collene Gobble, MD;  Location: [facility] ENDOSCOPY;  Service: Cardiopulmonary;;   COLONOSCOPY  2008   ESOPHAGOGASTRODUODENOSCOPY  11/23/2011   Procedure: ESOPHAGOGASTRODUODENOSCOPY (EGD);  Surgeon: Owens Loffler, MD;  Location: [facility] ENDOSCOPY;  Service: Endoscopy;  Laterality: N/A;   INTERCOSTAL NERVE BLOCK Left 01/01/2019   Procedure: INTERCOSTAL  NERVE BLOCK;  Surgeon: Grace Isaac, MD;  Location: [place];  Service: Thoracic;  Laterality: Left;   LUNG BIOPSY  12/18/2018   Procedure: LUNG BIOPSY - of Left Upper Lobe Biopsy of #7 Node Biopsy of  #10L Node;  Surgeon: Grace Isaac, MD;  Location: [place];  Service: Thoracic;;   LYMPH NODE DISSECTION Left 01/01/2019   Procedure: LYMPH NODE DISSECTION;  Surgeon: Grace Isaac, MD;  Location: [place];  Service: Thoracic;  Laterality: Left;   UPPER GASTROINTESTINAL ENDOSCOPY  11/23/11   VIDEO ASSISTED THORACOSCOPY (VATS)/WEDGE RESECTION Left 01/01/2019   Procedure: VIDEO ASSISTED THORACOSCOPY (VATS)/ LEFT LINGULECTOMY;  Surgeon: Grace Isaac, MD;  Location: [place];  Service: Thoracic;  Laterality: Left;   VIDEO BRONCHOSCOPY WITH ENDOBRONCHIAL NAVIGATION N/A 12/18/2018   Procedure: VIDEO BRONCHOSCOPY WITH ENDOBRONCHIAL NAVIGATION with Placement of Fiducial Markers x 3;  Surgeon: Grace Isaac, MD;  Location: [place];  Service: Thoracic;  Laterality: N/A;   VIDEO BRONCHOSCOPY WITH ENDOBRONCHIAL ULTRASOUND N/A 12/18/2018   Procedure: VIDEO BRONCHOSCOPY WITH ENDOBRONCHIAL ULTRASOUND;  Surgeon: Grace Isaac, MD;  Location: [place];  Service: Thoracic;  Laterality: N/A;   VIDEO BRONCHOSCOPY WITH ENDOBRONCHIAL ULTRASOUND Left 01/10/2022   Procedure: VIDEO BRONCHOSCOPY WITH ENDOBRONCHIAL ULTRASOUND;  Surgeon: Collene Gobble, MD;  Location: [place];  Service: Cardiopulmonary;  Laterality: Left;    FAMILY HISTORY:  Family History  Problem Relation Age of Onset   Stomach cancer Mother    Heart disease Father    Colon cancer Neg Hx     SOCIAL HISTORY:  Social History   Tobacco Use   Smoking status: Former    Packs/day: 0.25    Years: 5.00    Pack years: 1.25    Types: Cigarettes    Quit date: 11/04/2018    Years since quitting: 3.2   Smokeless tobacco: Never   Tobacco comments:    recently quit when diagnosed with lung cancer.   Vaping Use   Vaping Use: Never used  Substance Use Topics   Alcohol use: No   Drug use: No    ALLERGIES: No Known Allergies  MEDICATIONS:  Current Outpatient Medications  Medication Sig Dispense Refill   albuterol (PROVENTIL  HFA;VENTOLIN HFA) 108 (90 Base) MCG/ACT inhaler Inhale 2 puffs into the lungs every 6 (six) hours as needed for wheezing or shortness of breath. Reported on 06/02/2016     amLODipine (NORVASC) 10 MG tablet Take 10 mg by mouth daily.      esomeprazole (NEXIUM) 40 MG capsule Take 40 mg by mouth daily.      losartan (COZAAR) 50 MG tablet Take 50 mg by mouth daily.      prochlorperazine (COMPAZINE) 10 MG tablet Take 1 tablet (10 mg total) by mouth every 6 (six) hours as needed. 30 tablet 2   rosuvastatin (CRESTOR) 10 MG tablet Take 10 mg by mouth daily.     Tiotropium Bromide-Olodaterol 2.5-2.5 MCG/ACT AERS Inhale 1 puff into the lungs at bedtime. Anoro     traZODone (DESYREL) 50 MG tablet Take 50 mg by mouth at bedtime.     No current facility-administered medications for this encounter.    REVIEW OF SYSTEMS:  A 10+ POINT REVIEW OF SYSTEMS WAS OBTAINED including neurology, dermatology, psychiatry, cardiac, respiratory, lymph, extremities, GI, GU, musculoskeletal, constitutional, reproductive, HEENT.  Denies any pain within the chest area significant cough or hemoptysis.  He continues to be quite active with gardening and other issues.  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 His temperature is 97.7 F (36.5 C). His blood pressure is 98/43 (abnormal) and his pulse is 52 (abnormal). His respiration is 20 and oxygen saturation is 96%.   General: Alert and oriented, in no acute distress HEENT: Head is normocephalic. Extraocular movements are intact. Neck: Neck is supple, no palpable cervical or supraclavicular lymphadenopathy. Heart: Regular in rate and rhythm with no murmurs, rubs, or gallops. Chest: Clear to auscultation bilaterally, with no rhonchi, wheezes, or rales. Abdomen: Soft, nontender, nondistended, with no rigidity or guarding. Extremities: No cyanosis or edema. Lymphatics: see Neck Exam Skin: No concerning  lesions. Musculoskeletal: symmetric strength and muscle tone throughout. Neurologic: Cranial nerves II through XII are grossly intact. No obvious focalities. Speech is fluent. Coordination is intact. Psychiatric: Judgment and insight are intact. Affect is appropriate.   ECOG = 1  0 - Asymptomatic (Fully active, able to carry on all predisease activities without restriction)  1 - Symptomatic but completely ambulatory (Restricted in physically strenuous activity but ambulatory and able to carry out work of a light or sedentary nature. For example, light housework, office work)  2 - Symptomatic, <50% in bed during the day (Ambulatory and capable of all self care but unable to carry out any work activities. Up and about more than 50% of waking hours)  3 - Symptomatic, >50% in bed, but not bedbound (Capable of only limited self-care, confined to bed or chair 50% or more of waking hours)  4 - Bedbound (Completely disabled. Cannot carry on any self-care. Totally confined to bed or chair)  5 - Death   Eustace Pen MM, Creech RH, Tormey DC, et al. [phone]). "Toxicity and response criteria of the [facility]". [place] Oncol. 5 (6): 649-55  LABORATORY DATA:  Lab Results  Component Value Date   WBC 6.7 01/24/2022   HGB 14.4 01/24/2022   HCT 41.4 01/24/2022   MCV 92.6 01/24/2022   PLT 143 (L) 01/24/2022   NEUTROABS 4.0 01/24/2022   Lab Results  Component Value Date   NA 139 01/24/2022   K 4.5 01/24/2022   CL 107 01/24/2022   CO2 27 01/24/2022   GLUCOSE 102 (H) 01/24/2022   CREATININE 1.77 (H) 01/24/2022   CALCIUM 9.3 01/24/2022      RADIOGRAPHY: MR Brain W Wo Contrast  Result Date: 01/21/2022 CLINICAL DATA:  Adenocarcinoma of the lung.  Intracranial staging. EXAM: MRI HEAD WITHOUT AND WITH CONTRAST TECHNIQUE: Multiplanar, multiecho pulse sequences of the brain and surrounding structures were obtained without and with intravenous contrast. CONTRAST:  7.41mL GADAVIST  GADOBUTROL 1 MMOL/ML IV SOLN COMPARISON:  None. FINDINGS: Brain: No abnormal swelling or enhancement. Cystic space in the deep left cerebellum with rim of gliosis and central CSF signal. No superimposed enhancement, appearance consistent with chronic lacune. Mild chronic small vessel ischemia in the supratentorial white matter. No acute or subacute infarct, hemorrhage, hydrocephalus, or collection. Age normal brain volume Vascular: Major flow voids and vascular enhancements are preserved. Skull and upper cervical spine: Negative for lesion. Sinuses/Orbits: Negative IMPRESSION: Negative for metastatic disease to the brain. Electronically Signed   By: Jorje Guild M.D.   On: 01/21/2022 07:49      IMPRESSION: Recurrent non-small cell lung cancer initially diagnosed as stage IA (T1b, N0, M0) non-small cell lung cancer, adenocarcinoma presented with left upper lobe lung nodule diagnosed in January 2020.  The patient has  evidence for disease recurrence in AP window lymphadenopathy in December 2022.   The patient would be a good candidate for salvage radiation therapy directed at the mediastinal region along with radiosensitizing chemotherapy.  Today, I talked to the patient and his wife about the findings and work-up thus far.  We discussed the natural history of recurrent adenocarcinoma of the lung and general treatment, highlighting the role of radiotherapy in the management.  We discussed the available radiation techniques, and focused on the details of logistics and delivery.  We reviewed the anticipated acute and late sequelae associated with radiation in this setting.  The patient was encouraged to ask questions that I answered to the best of my ability.  A patient consent form was discussed and signed.  We retained a copy for our records.  The patient would like to proceed with radiation and will be scheduled for CT simulation.  PLAN: He will return for CT simulation on March 1 with treatments to begin  early next week.  He has already begun radiosensitizing chemotherapy.   60 minutes of total time was spent for this patient encounter, including preparation, face-to-face counseling with the patient and coordination of care, physical exam, and documentation of the encounter.   ------------------------------------------------  Blair Promise, PhD, MD  This document serves as a record of services personally performed by Gery Pray, MD. It was created on his behalf by Roney Mans, a trained medical scribe. The creation of this record is based on the scribe's personal observations and the provider's statements to them. This document has been checked and approved by the attending provider.

## 2022-01-31 ENCOUNTER — Other Ambulatory Visit: Payer: Self-pay

## 2022-01-31 ENCOUNTER — Ambulatory Visit
Admission: RE | Admit: 2022-01-31 | Discharge: 2022-01-31 | Disposition: A | Discharge status: Home / Self Care | Payer: PPO | Source: Ambulatory Visit | Attending: Radiation Oncology | Admitting: Radiation Oncology

## 2022-01-31 ENCOUNTER — Encounter: Payer: Self-pay | Admitting: Radiation Oncology

## 2022-01-31 ENCOUNTER — Encounter: Payer: Self-pay | Admitting: Internal Medicine

## 2022-01-31 VITALS — BP 98/43 | HR 52 | Temp 97.7°F | Resp 20 | Ht 68.0 in | Wt 180.2 lb

## 2022-01-31 DIAGNOSIS — K219 Gastro-esophageal reflux disease without esophagitis: Secondary | ICD-10-CM | POA: Insufficient documentation

## 2022-01-31 DIAGNOSIS — Z86010 Personal history of colonic polyps: Secondary | ICD-10-CM | POA: Insufficient documentation

## 2022-01-31 DIAGNOSIS — N183 Chronic kidney disease, stage 3 unspecified: Secondary | ICD-10-CM | POA: Insufficient documentation

## 2022-01-31 DIAGNOSIS — J449 Chronic obstructive pulmonary disease, unspecified: Secondary | ICD-10-CM | POA: Diagnosis not present

## 2022-01-31 DIAGNOSIS — I6782 Cerebral ischemia: Secondary | ICD-10-CM | POA: Insufficient documentation

## 2022-01-31 DIAGNOSIS — E7800 Pure hypercholesterolemia, unspecified: Secondary | ICD-10-CM | POA: Insufficient documentation

## 2022-01-31 DIAGNOSIS — Z87891 Personal history of nicotine dependence: Secondary | ICD-10-CM | POA: Insufficient documentation

## 2022-01-31 DIAGNOSIS — I10 Essential (primary) hypertension: Secondary | ICD-10-CM | POA: Diagnosis not present

## 2022-01-31 DIAGNOSIS — C3412 Malignant neoplasm of upper lobe, left bronchus or lung: Secondary | ICD-10-CM | POA: Diagnosis not present

## 2022-01-31 DIAGNOSIS — Z923 Personal history of irradiation: Secondary | ICD-10-CM | POA: Insufficient documentation

## 2022-01-31 DIAGNOSIS — Z79899 Other long term (current) drug therapy: Secondary | ICD-10-CM | POA: Diagnosis not present

## 2022-01-31 DIAGNOSIS — C3492 Malignant neoplasm of unspecified part of left bronchus or lung: Secondary | ICD-10-CM

## 2022-01-31 MED FILL — Dexamethasone Sodium Phosphate Inj 100 MG/10ML: INTRAMUSCULAR | Qty: 1 | Status: AC

## 2022-01-31 NOTE — Progress Notes (Signed)
See MD note for nursing evaluation. °

## 2022-01-31 NOTE — Progress Notes (Signed)
Called pt to introduce myself as his Arboriculturist and to discuss the [employer].  Unfortunately there aren't any foundations offering copay assistance for his Dx and the type of ins he has.  I left a msg requesting he return my call if he's interested in applying for the grant.

## 2022-02-01 ENCOUNTER — Inpatient Hospital Stay: Payer: PPO

## 2022-02-01 ENCOUNTER — Inpatient Hospital Stay (HOSPITAL_BASED_OUTPATIENT_CLINIC_OR_DEPARTMENT_OTHER): Payer: PPO | Admitting: Physician Assistant

## 2022-02-01 VITALS — BP 127/67 | HR 45 | Temp 97.6°F | Resp 18 | Ht 68.0 in | Wt 179.1 lb

## 2022-02-01 DIAGNOSIS — C3492 Malignant neoplasm of unspecified part of left bronchus or lung: Secondary | ICD-10-CM

## 2022-02-01 DIAGNOSIS — Z5111 Encounter for antineoplastic chemotherapy: Secondary | ICD-10-CM | POA: Diagnosis not present

## 2022-02-01 LAB — CBC WITH DIFFERENTIAL (CANCER CENTER ONLY)
Abs Immature Granulocytes: 0.02 10*3/uL (ref 0.00–0.07)
Basophils Absolute: 0 10*3/uL (ref 0.0–0.1)
Basophils Relative: 1 %
Eosinophils Absolute: 0.1 10*3/uL (ref 0.0–0.5)
Eosinophils Relative: 3 %
HCT: 37 % — ABNORMAL LOW (ref 39.0–52.0)
Hemoglobin: 12.9 g/dL — ABNORMAL LOW (ref 13.0–17.0)
Immature Granulocytes: 0 %
Lymphocytes Relative: 31 %
Lymphs Abs: 1.8 10*3/uL (ref 0.7–4.0)
MCH: 32.3 pg (ref 26.0–34.0)
MCHC: 34.9 g/dL (ref 30.0–36.0)
MCV: 92.7 fL (ref 80.0–100.0)
Monocytes Absolute: 0.4 10*3/uL (ref 0.1–1.0)
Monocytes Relative: 8 %
Neutro Abs: 3.3 10*3/uL (ref 1.7–7.7)
Neutrophils Relative %: 57 %
Platelet Count: 149 10*3/uL — ABNORMAL LOW (ref 150–400)
RBC: 3.99 MIL/uL — ABNORMAL LOW (ref 4.22–5.81)
RDW: 12.6 % (ref 11.5–15.5)
WBC Count: 5.7 10*3/uL (ref 4.0–10.5)
nRBC: 0 % (ref 0.0–0.2)

## 2022-02-01 LAB — CMP (CANCER CENTER ONLY)
ALT: 21 U/L (ref 0–44)
AST: 15 U/L (ref 15–41)
Albumin: 3.9 g/dL (ref 3.5–5.0)
Alkaline Phosphatase: 57 U/L (ref 38–126)
Anion gap: 5 (ref 5–15)
BUN: 18 mg/dL (ref 8–23)
CO2: 27 mmol/L (ref 22–32)
Calcium: 8.9 mg/dL (ref 8.9–10.3)
Chloride: 107 mmol/L (ref 98–111)
Creatinine: 1.74 mg/dL — ABNORMAL HIGH (ref 0.61–1.24)
GFR, Estimated: 40 mL/min — ABNORMAL LOW (ref >60)
Glucose, Bld: 74 mg/dL (ref 70–99)
Potassium: 4.4 mmol/L (ref 3.5–5.1)
Sodium: 139 mmol/L (ref 135–145)
Total Bilirubin: 0.6 mg/dL (ref 0.3–1.2)
Total Protein: 6.5 g/dL (ref 6.5–8.1)

## 2022-02-01 MED ORDER — SODIUM CHLORIDE 0.9 % IV SOLN
10.0000 mg | Freq: Once | INTRAVENOUS | Status: AC
  Administered 2022-02-01: 10 mg via INTRAVENOUS
  Filled 2022-02-01: qty 10

## 2022-02-01 MED ORDER — SODIUM CHLORIDE 0.9 % IV SOLN
45.0000 mg/m2 | Freq: Once | INTRAVENOUS | Status: AC
  Administered 2022-02-01: 90 mg via INTRAVENOUS
  Filled 2022-02-01: qty 15

## 2022-02-01 MED ORDER — SODIUM CHLORIDE 0.9 % IV SOLN: Freq: Once | INTRAVENOUS | Status: AC

## 2022-02-01 MED ORDER — SODIUM CHLORIDE 0.9 % IV SOLN
132.0000 mg | Freq: Once | INTRAVENOUS | Status: AC
  Administered 2022-02-01: 130 mg via INTRAVENOUS
  Filled 2022-02-01: qty 13

## 2022-02-01 MED ORDER — DIPHENHYDRAMINE HCL 50 MG/ML IJ SOLN
50.0000 mg | Freq: Once | INTRAMUSCULAR | Status: AC
  Administered 2022-02-01: 50 mg via INTRAVENOUS
  Filled 2022-02-01: qty 1

## 2022-02-01 MED ORDER — PALONOSETRON HCL INJECTION 0.25 MG/5ML
0.2500 mg | Freq: Once | INTRAVENOUS | Status: AC
  Administered 2022-02-01: 0.25 mg via INTRAVENOUS
  Filled 2022-02-01: qty 5

## 2022-02-01 MED ORDER — FAMOTIDINE IN NACL 20-0.9 MG/50ML-% IV SOLN
20.0000 mg | Freq: Once | INTRAVENOUS | Status: AC
  Administered 2022-02-01: 20 mg via INTRAVENOUS
  Filled 2022-02-01: qty 50

## 2022-02-01 NOTE — Progress Notes (Signed)
Patient creatinine is 1.74. Per Cassie Heilingoetter PA, ok to treat.

## 2022-02-01 NOTE — Patient Instructions (Signed)
[facility]  Discharge Instructions: Thank you for choosing [facility] to provide your oncology and hematology care.   If you have a lab appointment with the [facility], please go directly to the [facility] and check in at the registration area.   Wear comfortable clothing and clothing appropriate for easy access to any Portacath or PICC line.   We strive to give you quality time with your provider. You may need to reschedule your appointment if you arrive late (15 or more minutes).  Arriving late affects you and other patients whose appointments are after yours.  Also, if you miss three or more appointments without notifying the office, you may be dismissed from the clinic at the providers discretion.      For prescription refill requests, have your pharmacy contact our office and allow 72 hours for refills to be completed.    Today you received the following chemotherapy and/or immunotherapy agents: Carboplatin, Paclitaxel.      To help prevent nausea and vomiting after your treatment, we encourage you to take your nausea medication as directed.  BELOW ARE SYMPTOMS THAT SHOULD BE REPORTED IMMEDIATELY: *FEVER GREATER THAN 100.4 F (38 C) OR HIGHER *CHILLS OR SWEATING *NAUSEA AND VOMITING THAT IS NOT CONTROLLED WITH YOUR NAUSEA MEDICATION *UNUSUAL SHORTNESS OF BREATH *UNUSUAL BRUISING OR BLEEDING *URINARY PROBLEMS (pain or burning when urinating, or frequent urination) *BOWEL PROBLEMS (unusual diarrhea, constipation, pain near the anus) TENDERNESS IN MOUTH AND THROAT WITH OR WITHOUT PRESENCE OF ULCERS (sore throat, sores in mouth, or a toothache) UNUSUAL RASH, SWELLING OR PAIN  UNUSUAL VAGINAL DISCHARGE OR ITCHING   Items with * indicate a potential emergency and should be followed up as soon as possible or go to the Emergency Department if any problems should occur.  Please show the CHEMOTHERAPY ALERT CARD or IMMUNOTHERAPY ALERT CARD  at check-in to the Emergency Department and triage nurse.  Should you have questions after your visit or need to cancel or reschedule your appointment, please contact [facility]  Dept: [phone]  and follow the prompts.  Office hours are 8:00 a.m. to 4:30 p.m. Monday - Friday. Please note that voicemails left after 4:00 p.m. may not be returned until the following business day.  We are closed weekends and major holidays. You have access to a nurse at all times for urgent questions. Please call the main number to the clinic Dept: [phone] and follow the prompts.   For any non-urgent questions, you may also contact your provider using MyChart. We now offer e-Visits for anyone 62 and older to request care online for non-urgent symptoms. For details visit [email or web address].   Also download the MyChart app! Go to the app store, search "MyChart", open the app, select [facility], and log in with your MyChart username and password.  Due to Covid, a mask is required upon entering the hospital/clinic. If you do not have a mask, one will be given to you upon arrival. For doctor visits, patients may have 1 support person aged 62 or older with them. For treatment visits, patients cannot have anyone with them due to current Covid guidelines and our immunocompromised population.   Carboplatin injection What is this medication? CARBOPLATIN (KAR boe pla tin) is a chemotherapy drug. It targets fast dividing cells, like cancer cells, and causes these cells to die. This medicine is used to treat ovarian cancer and many other cancers. This medicine may be used for other  purposes; ask your health care provider or pharmacist if you have questions. COMMON BRAND NAME(S): Paraplatin What should I tell my care team before I take this medication? They need to know if you have any of these conditions: blood disorders hearing problems kidney disease recent or ongoing radiation  therapy an unusual or allergic reaction to carboplatin, cisplatin, other chemotherapy, other medicines, foods, dyes, or preservatives pregnant or trying to get pregnant breast-feeding How should I use this medication? This drug is usually given as an infusion into a vein. It is administered in a hospital or clinic by a specially trained health care professional. Talk to your pediatrician regarding the use of this medicine in children. Special care may be needed. Overdosage: If you think you have taken too much of this medicine contact a poison control center or emergency room at once. NOTE: This medicine is only for you. Do not share this medicine with others. What if I miss a dose? It is important not to miss a dose. Call your doctor or health care professional if you are unable to keep an appointment. What may interact with this medication? medicines for seizures medicines to increase blood counts like filgrastim, pegfilgrastim, sargramostim some antibiotics like amikacin, gentamicin, neomycin, streptomycin, tobramycin vaccines Talk to your doctor or health care professional before taking any of these medicines: acetaminophen aspirin ibuprofen ketoprofen naproxen This list may not describe all possible interactions. Give your health care provider a list of all the medicines, herbs, non-prescription drugs, or dietary supplements you use. Also tell them if you smoke, drink alcohol, or use illegal drugs. Some items may interact with your medicine. What should I watch for while using this medication? Your condition will be monitored carefully while you are receiving this medicine. You will need important blood work done while you are taking this medicine. This drug may make you feel generally unwell. This is not uncommon, as chemotherapy can affect healthy cells as well as cancer cells. Report any side effects. Continue your course of treatment even though you feel ill unless your doctor tells  you to stop. In some cases, you may be given additional medicines to help with side effects. Follow all directions for their use. Call your doctor or health care professional for advice if you get a fever, chills or sore throat, or other symptoms of a cold or flu. Do not treat yourself. This drug decreases your body's ability to fight infections. Try to avoid being around people who are sick. This medicine may increase your risk to bruise or bleed. Call your doctor or health care professional if you notice any unusual bleeding. Be careful brushing and flossing your teeth or using a toothpick because you may get an infection or bleed more easily. If you have any dental work done, tell your dentist you are receiving this medicine. Avoid taking products that contain aspirin, acetaminophen, ibuprofen, naproxen, or ketoprofen unless instructed by your doctor. These medicines may hide a fever. Do not become pregnant while taking this medicine. Women should inform their doctor if they wish to become pregnant or think they might be pregnant. There is a potential for serious side effects to an unborn child. Talk to your health care professional or pharmacist for more information. Do not breast-feed an infant while taking this medicine. What side effects may I notice from receiving this medication? Side effects that you should report to your doctor or health care professional as soon as possible: allergic reactions like skin rash, itching or hives,  swelling of the face, lips, or tongue signs of infection - fever or chills, cough, sore throat, pain or difficulty passing urine signs of decreased platelets or bleeding - bruising, pinpoint red spots on the skin, black, tarry stools, nosebleeds signs of decreased red blood cells - unusually weak or tired, fainting spells, lightheadedness breathing problems changes in hearing changes in vision chest pain high blood pressure low blood counts - This drug may  decrease the number of white blood cells, red blood cells and platelets. You may be at increased risk for infections and bleeding. nausea and vomiting pain, swelling, redness or irritation at the injection site pain, tingling, numbness in the hands or feet problems with balance, talking, walking trouble passing urine or change in the amount of urine Side effects that usually do not require medical attention (report to your doctor or health care professional if they continue or are bothersome): hair loss loss of appetite metallic taste in the mouth or changes in taste This list may not describe all possible side effects. Call your doctor for medical advice about side effects. You may report side effects to FDA at 1-800-FDA-1088. Where should I keep my medication? This drug is given in a hospital or clinic and will not be stored at home. NOTE: This sheet is a summary. It may not cover all possible information. If you have questions about this medicine, talk to your doctor, pharmacist, or health care provider.  2022 Elsevier/Gold Standard (2008-04-30 00:00:00)  Paclitaxel injection What is this medication? PACLITAXEL (PAK li TAX el) is a chemotherapy drug. It targets fast dividing cells, like cancer cells, and causes these cells to die. This medicine is used to treat ovarian cancer, breast cancer, lung cancer, Kaposi's sarcoma, and other cancers. This medicine may be used for other purposes; ask your health care provider or pharmacist if you have questions. COMMON BRAND NAME(S): Onxol, Taxol What should I tell my care team before I take this medication? They need to know if you have any of these conditions: history of irregular heartbeat liver disease low blood counts, like low white cell, platelet, or red cell counts lung or breathing disease, like asthma tingling of the fingers or toes, or other nerve disorder an unusual or allergic reaction to paclitaxel, alcohol, polyoxyethylated castor  oil, other chemotherapy, other medicines, foods, dyes, or preservatives pregnant or trying to get pregnant breast-feeding How should I use this medication? This drug is given as an infusion into a vein. It is administered in a hospital or clinic by a specially trained health care professional. Talk to your pediatrician regarding the use of this medicine in children. Special care may be needed. Overdosage: If you think you have taken too much of this medicine contact a poison control center or emergency room at once. NOTE: This medicine is only for you. Do not share this medicine with others. What if I miss a dose? It is important not to miss your dose. Call your doctor or health care professional if you are unable to keep an appointment. What may interact with this medication? Do not take this medicine with any of the following medications: live virus vaccines This medicine may also interact with the following medications: antiviral medicines for hepatitis, HIV or AIDS certain antibiotics like erythromycin and clarithromycin certain medicines for fungal infections like ketoconazole and itraconazole certain medicines for seizures like carbamazepine, phenobarbital, phenytoin gemfibrozil nefazodone rifampin St. John's wort This list may not describe all possible interactions. Give your health care provider a  list of all the medicines, herbs, non-prescription drugs, or dietary supplements you use. Also tell them if you smoke, drink alcohol, or use illegal drugs. Some items may interact with your medicine. What should I watch for while using this medication? Your condition will be monitored carefully while you are receiving this medicine. You will need important blood work done while you are taking this medicine. This medicine can cause serious allergic reactions. To reduce your risk you will need to take other medicine(s) before treatment with this medicine. If you experience allergic reactions  like skin rash, itching or hives, swelling of the face, lips, or tongue, tell your doctor or health care professional right away. In some cases, you may be given additional medicines to help with side effects. Follow all directions for their use. This drug may make you feel generally unwell. This is not uncommon, as chemotherapy can affect healthy cells as well as cancer cells. Report any side effects. Continue your course of treatment even though you feel ill unless your doctor tells you to stop. Call your doctor or health care professional for advice if you get a fever, chills or sore throat, or other symptoms of a cold or flu. Do not treat yourself. This drug decreases your body's ability to fight infections. Try to avoid being around people who are sick. This medicine may increase your risk to bruise or bleed. Call your doctor or health care professional if you notice any unusual bleeding. Be careful brushing and flossing your teeth or using a toothpick because you may get an infection or bleed more easily. If you have any dental work done, tell your dentist you are receiving this medicine. Avoid taking products that contain aspirin, acetaminophen, ibuprofen, naproxen, or ketoprofen unless instructed by your doctor. These medicines may hide a fever. Do not become pregnant while taking this medicine. Women should inform their doctor if they wish to become pregnant or think they might be pregnant. There is a potential for serious side effects to an unborn child. Talk to your health care professional or pharmacist for more information. Do not breast-feed an infant while taking this medicine. Men are advised not to father a child while receiving this medicine. This product may contain alcohol. Ask your pharmacist or healthcare provider if this medicine contains alcohol. Be sure to tell all healthcare providers you are taking this medicine. Certain medicines, like metronidazole and disulfiram, can cause an  unpleasant reaction when taken with alcohol. The reaction includes flushing, headache, nausea, vomiting, sweating, and increased thirst. The reaction can last from 30 minutes to several hours. What side effects may I notice from receiving this medication? Side effects that you should report to your doctor or health care professional as soon as possible: allergic reactions like skin rash, itching or hives, swelling of the face, lips, or tongue breathing problems changes in vision fast, irregular heartbeat high or low blood pressure mouth sores pain, tingling, numbness in the hands or feet signs of decreased platelets or bleeding - bruising, pinpoint red spots on the skin, black, tarry stools, blood in the urine signs of decreased red blood cells - unusually weak or tired, feeling faint or lightheaded, falls signs of infection - fever or chills, cough, sore throat, pain or difficulty passing urine signs and symptoms of liver injury like dark yellow or brown urine; general ill feeling or flu-like symptoms; light-colored stools; loss of appetite; nausea; right upper belly pain; unusually weak or tired; yellowing of the eyes or skin swelling  of the ankles, feet, hands unusually slow heartbeat Side effects that usually do not require medical attention (report to your doctor or health care professional if they continue or are bothersome): diarrhea hair loss loss of appetite muscle or joint pain nausea, vomiting pain, redness, or irritation at site where injected tiredness This list may not describe all possible side effects. Call your doctor for medical advice about side effects. You may report side effects to FDA at 1-800-FDA-1088. Where should I keep my medication? This drug is given in a hospital or clinic and will not be stored at home. NOTE: This sheet is a summary. It may not cover all possible information. If you have questions about this medicine, talk to your doctor, pharmacist, or  health care provider.  2022 Elsevier/Gold Standard (2021-08-10 00:00:00)

## 2022-02-02 ENCOUNTER — Ambulatory Visit
Admission: RE | Admit: 2022-02-02 | Discharge: 2022-02-02 | Disposition: A | Discharge status: Home / Self Care | Payer: PPO | Source: Ambulatory Visit | Attending: Radiation Oncology | Admitting: Radiation Oncology

## 2022-02-02 ENCOUNTER — Other Ambulatory Visit: Payer: Self-pay

## 2022-02-02 DIAGNOSIS — Z79899 Other long term (current) drug therapy: Secondary | ICD-10-CM | POA: Diagnosis not present

## 2022-02-02 DIAGNOSIS — Z5111 Encounter for antineoplastic chemotherapy: Secondary | ICD-10-CM | POA: Insufficient documentation

## 2022-02-02 DIAGNOSIS — C3492 Malignant neoplasm of unspecified part of left bronchus or lung: Secondary | ICD-10-CM

## 2022-02-02 DIAGNOSIS — D6959 Other secondary thrombocytopenia: Secondary | ICD-10-CM | POA: Insufficient documentation

## 2022-02-02 DIAGNOSIS — Z510 Encounter for antineoplastic radiation therapy: Secondary | ICD-10-CM | POA: Diagnosis present

## 2022-02-02 DIAGNOSIS — C3412 Malignant neoplasm of upper lobe, left bronchus or lung: Secondary | ICD-10-CM | POA: Diagnosis present

## 2022-02-04 ENCOUNTER — Encounter (HOSPITAL_COMMUNITY): Payer: Self-pay

## 2022-02-04 DIAGNOSIS — Z510 Encounter for antineoplastic radiation therapy: Secondary | ICD-10-CM | POA: Diagnosis not present

## 2022-02-07 ENCOUNTER — Inpatient Hospital Stay: Payer: PPO

## 2022-02-07 ENCOUNTER — Other Ambulatory Visit: Payer: PPO

## 2022-02-07 ENCOUNTER — Ambulatory Visit
Admission: RE | Admit: 2022-02-07 | Discharge: 2022-02-07 | Disposition: A | Discharge status: Home / Self Care | Payer: PPO | Source: Ambulatory Visit | Attending: Radiation Oncology | Admitting: Radiation Oncology

## 2022-02-07 ENCOUNTER — Other Ambulatory Visit: Payer: Self-pay

## 2022-02-07 VITALS — BP 127/57 | HR 43 | Temp 97.7°F | Resp 16 | Wt 176.5 lb

## 2022-02-07 DIAGNOSIS — Z5111 Encounter for antineoplastic chemotherapy: Secondary | ICD-10-CM | POA: Insufficient documentation

## 2022-02-07 DIAGNOSIS — R5383 Other fatigue: Secondary | ICD-10-CM | POA: Insufficient documentation

## 2022-02-07 DIAGNOSIS — C3492 Malignant neoplasm of unspecified part of left bronchus or lung: Secondary | ICD-10-CM

## 2022-02-07 DIAGNOSIS — J449 Chronic obstructive pulmonary disease, unspecified: Secondary | ICD-10-CM | POA: Insufficient documentation

## 2022-02-07 DIAGNOSIS — Z79899 Other long term (current) drug therapy: Secondary | ICD-10-CM | POA: Insufficient documentation

## 2022-02-07 DIAGNOSIS — J029 Acute pharyngitis, unspecified: Secondary | ICD-10-CM | POA: Insufficient documentation

## 2022-02-07 DIAGNOSIS — C3412 Malignant neoplasm of upper lobe, left bronchus or lung: Secondary | ICD-10-CM | POA: Insufficient documentation

## 2022-02-07 DIAGNOSIS — I6782 Cerebral ischemia: Secondary | ICD-10-CM | POA: Insufficient documentation

## 2022-02-07 DIAGNOSIS — Z923 Personal history of irradiation: Secondary | ICD-10-CM | POA: Insufficient documentation

## 2022-02-07 DIAGNOSIS — Z510 Encounter for antineoplastic radiation therapy: Secondary | ICD-10-CM | POA: Diagnosis not present

## 2022-02-07 LAB — CMP (CANCER CENTER ONLY)
ALT: 18 U/L (ref 0–44)
AST: 12 U/L — ABNORMAL LOW (ref 15–41)
Albumin: 4 g/dL (ref 3.5–5.0)
Alkaline Phosphatase: 54 U/L (ref 38–126)
Anion gap: 7 (ref 5–15)
BUN: 16 mg/dL (ref 8–23)
CO2: 24 mmol/L (ref 22–32)
Calcium: 8.9 mg/dL (ref 8.9–10.3)
Chloride: 107 mmol/L (ref 98–111)
Creatinine: 1.63 mg/dL — ABNORMAL HIGH (ref 0.61–1.24)
GFR, Estimated: 43 mL/min — ABNORMAL LOW (ref >60)
Glucose, Bld: 148 mg/dL — ABNORMAL HIGH (ref 70–99)
Potassium: 4.2 mmol/L (ref 3.5–5.1)
Sodium: 138 mmol/L (ref 135–145)
Total Bilirubin: 1 mg/dL (ref 0.3–1.2)
Total Protein: 6.9 g/dL (ref 6.5–8.1)

## 2022-02-07 LAB — CBC WITH DIFFERENTIAL (CANCER CENTER ONLY)
Abs Immature Granulocytes: 0.01 10*3/uL (ref 0.00–0.07)
Basophils Absolute: 0 10*3/uL (ref 0.0–0.1)
Basophils Relative: 0 %
Eosinophils Absolute: 0.1 10*3/uL (ref 0.0–0.5)
Eosinophils Relative: 2 %
HCT: 37.6 % — ABNORMAL LOW (ref 39.0–52.0)
Hemoglobin: 13.3 g/dL (ref 13.0–17.0)
Immature Granulocytes: 0 %
Lymphocytes Relative: 33 %
Lymphs Abs: 1.5 10*3/uL (ref 0.7–4.0)
MCH: 32.8 pg (ref 26.0–34.0)
MCHC: 35.4 g/dL (ref 30.0–36.0)
MCV: 92.6 fL (ref 80.0–100.0)
Monocytes Absolute: 0.2 10*3/uL (ref 0.1–1.0)
Monocytes Relative: 5 %
Neutro Abs: 2.8 10*3/uL (ref 1.7–7.7)
Neutrophils Relative %: 60 %
Platelet Count: 142 10*3/uL — ABNORMAL LOW (ref 150–400)
RBC: 4.06 MIL/uL — ABNORMAL LOW (ref 4.22–5.81)
RDW: 13.2 % (ref 11.5–15.5)
WBC Count: 4.7 10*3/uL (ref 4.0–10.5)
nRBC: 0 % (ref 0.0–0.2)

## 2022-02-07 MED ORDER — PALONOSETRON HCL INJECTION 0.25 MG/5ML
0.2500 mg | Freq: Once | INTRAVENOUS | Status: AC
  Administered 2022-02-07: 0.25 mg via INTRAVENOUS
  Filled 2022-02-07: qty 5

## 2022-02-07 MED ORDER — FAMOTIDINE IN NACL 20-0.9 MG/50ML-% IV SOLN
20.0000 mg | Freq: Once | INTRAVENOUS | Status: AC
  Administered 2022-02-07: 20 mg via INTRAVENOUS
  Filled 2022-02-07: qty 50

## 2022-02-07 MED ORDER — SODIUM CHLORIDE 0.9 % IV SOLN
10.0000 mg | Freq: Once | INTRAVENOUS | Status: AC
  Administered 2022-02-07: 10 mg via INTRAVENOUS
  Filled 2022-02-07: qty 10

## 2022-02-07 MED ORDER — SODIUM CHLORIDE 0.9 % IV SOLN
45.0000 mg/m2 | Freq: Once | INTRAVENOUS | Status: AC
  Administered 2022-02-07: 90 mg via INTRAVENOUS
  Filled 2022-02-07: qty 15

## 2022-02-07 MED ORDER — DIPHENHYDRAMINE HCL 50 MG/ML IJ SOLN
50.0000 mg | Freq: Once | INTRAMUSCULAR | Status: AC
  Administered 2022-02-07: 50 mg via INTRAVENOUS
  Filled 2022-02-07: qty 1

## 2022-02-07 MED ORDER — SODIUM CHLORIDE 0.9 % IV SOLN: Freq: Once | INTRAVENOUS | Status: AC

## 2022-02-07 MED ORDER — SODIUM CHLORIDE 0.9 % IV SOLN
132.0000 mg | Freq: Once | INTRAVENOUS | Status: AC
  Administered 2022-02-07: 130 mg via INTRAVENOUS
  Filled 2022-02-07: qty 13

## 2022-02-07 NOTE — Patient Instructions (Signed)
[facility]   Discharge Instructions: Thank you for choosing [facility] to provide your oncology and hematology care.   If you have a lab appointment with the Cancer Center, please go directly to the Cancer Center and check in at the registration area.   Wear comfortable clothing and clothing appropriate for easy access to any Portacath or PICC line.   We strive to give you quality time with your provider. You may need to reschedule your appointment if you arrive late (15 or more minutes).  Arriving late affects you and other patients whose appointments are after yours.  Also, if you miss three or more appointments without notifying the office, you may be dismissed from the clinic at the provider's discretion.      For prescription refill requests, have your pharmacy contact our office and allow 72 hours for refills to be completed.    Today you received the following chemotherapy and/or immunotherapy agents: paclitaxel and carboplatin.      To help prevent nausea and vomiting after your treatment, we encourage you to take your nausea medication as directed.  BELOW ARE SYMPTOMS THAT SHOULD BE REPORTED IMMEDIATELY: *FEVER GREATER THAN 100.4 F (38 C) OR HIGHER *CHILLS OR SWEATING *NAUSEA AND VOMITING THAT IS NOT CONTROLLED WITH YOUR NAUSEA MEDICATION *UNUSUAL SHORTNESS OF BREATH *UNUSUAL BRUISING OR BLEEDING *URINARY PROBLEMS (pain or burning when urinating, or frequent urination) *BOWEL PROBLEMS (unusual diarrhea, constipation, pain near the anus) TENDERNESS IN MOUTH AND THROAT WITH OR WITHOUT PRESENCE OF ULCERS (sore throat, sores in mouth, or a toothache) UNUSUAL RASH, SWELLING OR PAIN  UNUSUAL VAGINAL DISCHARGE OR ITCHING   Items with * indicate a potential emergency and should be followed up as soon as possible or go to the Emergency Department if any problems should occur.  Please show the CHEMOTHERAPY ALERT CARD or IMMUNOTHERAPY ALERT  CARD at check-in to the Emergency Department and triage nurse.  Should you have questions after your visit or need to cancel or reschedule your appointment, please contact [facility]  Dept: [phone]  and follow the prompts.  Office hours are 8:00 a.m. to 4:30 p.m. Monday - Friday. Please note that voicemails left after 4:00 p.m. may not be returned until the following business day.  We are closed weekends and major holidays. You have access to a nurse at all times for urgent questions. Please call the main number to the clinic Dept: [phone] and follow the prompts.   For any non-urgent questions, you may also contact your provider using MyChart. We now offer e-Visits for anyone 18 and older to request care online for non-urgent symptoms. For details visit [email or web address].   Also download the MyChart app! Go to the app store, search "MyChart", open the app, select , and log in with your MyChart username and password.  Due to Covid, a mask is required upon entering the hospital/clinic. If you do not have a mask, one will be given to you upon arrival. For doctor visits, patients may have 1 support person aged 18 or older with them. For treatment visits, patients cannot have anyone with them due to current Covid guidelines and our immunocompromised population.   

## 2022-02-07 NOTE — Progress Notes (Signed)
Ok to treat with Scr 1.63mg /dL per Cassie Heilingoetter, PA-C ?

## 2022-02-08 ENCOUNTER — Ambulatory Visit
Admission: RE | Admit: 2022-02-08 | Discharge: 2022-02-08 | Disposition: A | Discharge status: Home / Self Care | Payer: PPO | Source: Ambulatory Visit | Attending: Radiation Oncology | Admitting: Radiation Oncology

## 2022-02-08 DIAGNOSIS — Z510 Encounter for antineoplastic radiation therapy: Secondary | ICD-10-CM | POA: Diagnosis not present

## 2022-02-09 ENCOUNTER — Other Ambulatory Visit: Payer: Self-pay

## 2022-02-09 ENCOUNTER — Ambulatory Visit
Admission: RE | Admit: 2022-02-09 | Discharge: 2022-02-09 | Disposition: A | Discharge status: Home / Self Care | Payer: PPO | Source: Ambulatory Visit | Attending: Radiation Oncology | Admitting: Radiation Oncology

## 2022-02-09 DIAGNOSIS — Z510 Encounter for antineoplastic radiation therapy: Secondary | ICD-10-CM | POA: Diagnosis not present

## 2022-02-09 DIAGNOSIS — C3492 Malignant neoplasm of unspecified part of left bronchus or lung: Secondary | ICD-10-CM

## 2022-02-09 MED ORDER — SONAFINE EX EMUL
1.0000 "application " | Freq: Once | CUTANEOUS | Status: AC
  Administered 2022-02-09: 1 via TOPICAL

## 2022-02-10 ENCOUNTER — Ambulatory Visit
Admission: RE | Admit: 2022-02-10 | Discharge: 2022-02-10 | Disposition: A | Discharge status: Home / Self Care | Payer: PPO | Source: Ambulatory Visit | Attending: Radiation Oncology | Admitting: Radiation Oncology

## 2022-02-10 DIAGNOSIS — Z510 Encounter for antineoplastic radiation therapy: Secondary | ICD-10-CM | POA: Diagnosis not present

## 2022-02-11 ENCOUNTER — Ambulatory Visit
Admission: RE | Admit: 2022-02-11 | Discharge: 2022-02-11 | Disposition: A | Discharge status: Home / Self Care | Payer: PPO | Source: Ambulatory Visit | Attending: Radiation Oncology | Admitting: Radiation Oncology

## 2022-02-11 ENCOUNTER — Other Ambulatory Visit: Payer: Self-pay

## 2022-02-11 DIAGNOSIS — Z510 Encounter for antineoplastic radiation therapy: Secondary | ICD-10-CM | POA: Diagnosis not present

## 2022-02-11 MED FILL — Dexamethasone Sodium Phosphate Inj 100 MG/10ML: INTRAMUSCULAR | Qty: 1 | Status: AC

## 2022-02-14 ENCOUNTER — Inpatient Hospital Stay: Payer: PPO

## 2022-02-14 ENCOUNTER — Inpatient Hospital Stay (HOSPITAL_BASED_OUTPATIENT_CLINIC_OR_DEPARTMENT_OTHER): Payer: PPO | Admitting: Internal Medicine

## 2022-02-14 ENCOUNTER — Other Ambulatory Visit: Payer: Self-pay

## 2022-02-14 ENCOUNTER — Ambulatory Visit
Admission: RE | Admit: 2022-02-14 | Discharge: 2022-02-14 | Disposition: A | Discharge status: Home / Self Care | Payer: PPO | Source: Ambulatory Visit | Attending: Radiation Oncology | Admitting: Radiation Oncology

## 2022-02-14 VITALS — BP 121/62 | HR 52 | Temp 97.5°F | Resp 16 | Wt 181.8 lb

## 2022-02-14 DIAGNOSIS — C3492 Malignant neoplasm of unspecified part of left bronchus or lung: Secondary | ICD-10-CM

## 2022-02-14 DIAGNOSIS — Z5111 Encounter for antineoplastic chemotherapy: Secondary | ICD-10-CM | POA: Diagnosis not present

## 2022-02-14 DIAGNOSIS — Z510 Encounter for antineoplastic radiation therapy: Secondary | ICD-10-CM | POA: Diagnosis not present

## 2022-02-14 LAB — CBC WITH DIFFERENTIAL (CANCER CENTER ONLY)
Abs Immature Granulocytes: 0.01 10*3/uL (ref 0.00–0.07)
Basophils Absolute: 0 10*3/uL (ref 0.0–0.1)
Basophils Relative: 1 %
Eosinophils Absolute: 0 10*3/uL (ref 0.0–0.5)
Eosinophils Relative: 1 %
HCT: 33.8 % — ABNORMAL LOW (ref 39.0–52.0)
Hemoglobin: 11.6 g/dL — ABNORMAL LOW (ref 13.0–17.0)
Immature Granulocytes: 0 %
Lymphocytes Relative: 25 %
Lymphs Abs: 0.9 10*3/uL (ref 0.7–4.0)
MCH: 32.2 pg (ref 26.0–34.0)
MCHC: 34.3 g/dL (ref 30.0–36.0)
MCV: 93.9 fL (ref 80.0–100.0)
Monocytes Absolute: 0.4 10*3/uL (ref 0.1–1.0)
Monocytes Relative: 10 %
Neutro Abs: 2.3 10*3/uL (ref 1.7–7.7)
Neutrophils Relative %: 63 %
Platelet Count: 125 10*3/uL — ABNORMAL LOW (ref 150–400)
RBC: 3.6 MIL/uL — ABNORMAL LOW (ref 4.22–5.81)
RDW: 13.3 % (ref 11.5–15.5)
WBC Count: 3.6 10*3/uL — ABNORMAL LOW (ref 4.0–10.5)
nRBC: 0 % (ref 0.0–0.2)

## 2022-02-14 LAB — CMP (CANCER CENTER ONLY)
ALT: 15 U/L (ref 0–44)
AST: 10 U/L — ABNORMAL LOW (ref 15–41)
Albumin: 3.7 g/dL (ref 3.5–5.0)
Alkaline Phosphatase: 55 U/L (ref 38–126)
Anion gap: 5 (ref 5–15)
BUN: 14 mg/dL (ref 8–23)
CO2: 26 mmol/L (ref 22–32)
Calcium: 8.7 mg/dL — ABNORMAL LOW (ref 8.9–10.3)
Chloride: 109 mmol/L (ref 98–111)
Creatinine: 1.66 mg/dL — ABNORMAL HIGH (ref 0.61–1.24)
GFR, Estimated: 42 mL/min — ABNORMAL LOW (ref >60)
Glucose, Bld: 112 mg/dL — ABNORMAL HIGH (ref 70–99)
Potassium: 4.1 mmol/L (ref 3.5–5.1)
Sodium: 140 mmol/L (ref 135–145)
Total Bilirubin: 0.5 mg/dL (ref 0.3–1.2)
Total Protein: 6 g/dL — ABNORMAL LOW (ref 6.5–8.1)

## 2022-02-14 MED ORDER — PALONOSETRON HCL INJECTION 0.25 MG/5ML
0.2500 mg | Freq: Once | INTRAVENOUS | Status: AC
  Administered 2022-02-14: 0.25 mg via INTRAVENOUS
  Filled 2022-02-14: qty 5

## 2022-02-14 MED ORDER — SODIUM CHLORIDE 0.9 % IV SOLN: Freq: Once | INTRAVENOUS | Status: AC

## 2022-02-14 MED ORDER — SODIUM CHLORIDE 0.9 % IV SOLN
45.0000 mg/m2 | Freq: Once | INTRAVENOUS | Status: AC
  Administered 2022-02-14: 90 mg via INTRAVENOUS
  Filled 2022-02-14: qty 15

## 2022-02-14 MED ORDER — SODIUM CHLORIDE 0.9 % IV SOLN
10.0000 mg | Freq: Once | INTRAVENOUS | Status: AC
  Administered 2022-02-14: 10 mg via INTRAVENOUS
  Filled 2022-02-14: qty 10

## 2022-02-14 MED ORDER — FAMOTIDINE IN NACL 20-0.9 MG/50ML-% IV SOLN
20.0000 mg | Freq: Once | INTRAVENOUS | Status: AC
  Administered 2022-02-14: 20 mg via INTRAVENOUS
  Filled 2022-02-14: qty 50

## 2022-02-14 MED ORDER — SODIUM CHLORIDE 0.9 % IV SOLN
132.0000 mg | Freq: Once | INTRAVENOUS | Status: AC
  Administered 2022-02-14: 130 mg via INTRAVENOUS
  Filled 2022-02-14: qty 13

## 2022-02-14 MED ORDER — DIPHENHYDRAMINE HCL 50 MG/ML IJ SOLN
50.0000 mg | Freq: Once | INTRAMUSCULAR | Status: AC
  Administered 2022-02-14: 50 mg via INTRAVENOUS
  Filled 2022-02-14: qty 1

## 2022-02-14 NOTE — Progress Notes (Signed)
Per Cassie H, PA okay for patient to proceed with treatment today with creatine 1.66. ?

## 2022-02-14 NOTE — Patient Instructions (Signed)
[facility]   Discharge Instructions: Thank you for choosing [facility] to provide your oncology and hematology care.   If you have a lab appointment with the Cancer Center, please go directly to the Cancer Center and check in at the registration area.   Wear comfortable clothing and clothing appropriate for easy access to any Portacath or PICC line.   We strive to give you quality time with your provider. You may need to reschedule your appointment if you arrive late (15 or more minutes).  Arriving late affects you and other patients whose appointments are after yours.  Also, if you miss three or more appointments without notifying the office, you may be dismissed from the clinic at the provider's discretion.      For prescription refill requests, have your pharmacy contact our office and allow 72 hours for refills to be completed.    Today you received the following chemotherapy and/or immunotherapy agents: paclitaxel and carboplatin.      To help prevent nausea and vomiting after your treatment, we encourage you to take your nausea medication as directed.  BELOW ARE SYMPTOMS THAT SHOULD BE REPORTED IMMEDIATELY: *FEVER GREATER THAN 100.4 F (38 C) OR HIGHER *CHILLS OR SWEATING *NAUSEA AND VOMITING THAT IS NOT CONTROLLED WITH YOUR NAUSEA MEDICATION *UNUSUAL SHORTNESS OF BREATH *UNUSUAL BRUISING OR BLEEDING *URINARY PROBLEMS (pain or burning when urinating, or frequent urination) *BOWEL PROBLEMS (unusual diarrhea, constipation, pain near the anus) TENDERNESS IN MOUTH AND THROAT WITH OR WITHOUT PRESENCE OF ULCERS (sore throat, sores in mouth, or a toothache) UNUSUAL RASH, SWELLING OR PAIN  UNUSUAL VAGINAL DISCHARGE OR ITCHING   Items with * indicate a potential emergency and should be followed up as soon as possible or go to the Emergency Department if any problems should occur.  Please show the CHEMOTHERAPY ALERT CARD or IMMUNOTHERAPY ALERT  CARD at check-in to the Emergency Department and triage nurse.  Should you have questions after your visit or need to cancel or reschedule your appointment, please contact [facility]  Dept: [phone]  and follow the prompts.  Office hours are 8:00 a.m. to 4:30 p.m. Monday - Friday. Please note that voicemails left after 4:00 p.m. may not be returned until the following business day.  We are closed weekends and major holidays. You have access to a nurse at all times for urgent questions. Please call the main number to the clinic Dept: [phone] and follow the prompts.   For any non-urgent questions, you may also contact your provider using MyChart. We now offer e-Visits for anyone 18 and older to request care online for non-urgent symptoms. For details visit [email or web address].   Also download the MyChart app! Go to the app store, search "MyChart", open the app, select , and log in with your MyChart username and password.  Due to Covid, a mask is required upon entering the hospital/clinic. If you do not have a mask, one will be given to you upon arrival. For doctor visits, patients may have 1 support person aged 18 or older with them. For treatment visits, patients cannot have anyone with them due to current Covid guidelines and our immunocompromised population.   

## 2022-02-14 NOTE — Progress Notes (Signed)
[place] [phone]   Fax:([phone]  OFFICE PROGRESS NOTE  [name], MD [address] [place] [address]  DIAGNOSIS: Recurrent non-small cell lung cancer initially diagnosed as stage IA (T1b, N0, M0) non-small cell lung cancer, adenocarcinoma presented with left upper lobe lung nodule diagnosed in January 2020.  The patient has evidence for disease recurrence in AP window lymphadenopathy in December 2022.   There was insufficient material for the molecular studies by [facility].   PRIOR THERAPY: Status post left lingulectomy with lymph node dissection under the care of Dr. Servando Snare on January 01, 2019   CURRENT THERAPY: Concurrent chemoradiation with carboplatin for an AUC of 2 and paclitaxel 45 mg per metered squared weekly.  First dose on 01/24/2022.  Status post 3 cycles.   INTERVAL HISTORY: Jack Taylor 76 y.o. male returns to the clinic today for follow-up visit accompanied by his wife.  The patient is feeling fine today with no concerning complaints except for mild sore throat from the radiotherapy.  He denied having any current chest pain, shortness of breath, cough or hemoptysis.  He denied having any nausea, vomiting, diarrhea or constipation.  He has no headache or visual changes.  He is here today for evaluation before starting cycle #4 of his treatment.  MEDICAL HISTORY: Past Medical History:  Diagnosis Date   Adenomatous colon polyp    Anxiety    Blood transfusion without reported diagnosis    Cancer ([place])    Lung; 11/2018   CKD (chronic kidney disease)    stage III 11/09/18   COPD (chronic obstructive pulmonary disease) (HCC)    Gastric ulcer    GERD (gastroesophageal reflux disease)    High cholesterol    Hypertension    Pre-diabetes    A1c 6.2% 11/09/18    ALLERGIES:  has No Known Allergies.  MEDICATIONS:  Current Outpatient Medications  Medication Sig Dispense Refill   amLODipine (NORVASC)  10 MG tablet Take 10 mg by mouth daily.      ANORO ELLIPTA 62.5-25 MCG/ACT AEPB Inhale 1 puff into the lungs daily.     esomeprazole (NEXIUM) 40 MG capsule Take 40 mg by mouth daily.      losartan (COZAAR) 50 MG tablet Take 50 mg by mouth daily.      prochlorperazine (COMPAZINE) 10 MG tablet Take 1 tablet (10 mg total) by mouth every 6 (six) hours as needed. 30 tablet 2   rosuvastatin (CRESTOR) 10 MG tablet Take 10 mg by mouth daily.     traZODone (DESYREL) 50 MG tablet Take 50 mg by mouth at bedtime.     albuterol (PROVENTIL HFA;VENTOLIN HFA) 108 (90 Base) MCG/ACT inhaler Inhale 2 puffs into the lungs every 6 (six) hours as needed for wheezing or shortness of breath. Reported on 06/02/2016 (Patient not taking: Reported on 02/14/2022)     No current facility-administered medications for this visit.    SURGICAL HISTORY:  Past Surgical History:  Procedure Laterality Date   BRONCHIAL NEEDLE ASPIRATION BIOPSY  01/10/2022   Procedure: BRONCHIAL NEEDLE ASPIRATION BIOPSIES;  Surgeon: Collene Gobble, MD;  Location: [facility] ENDOSCOPY;  Service: Cardiopulmonary;;   COLONOSCOPY  2008   ESOPHAGOGASTRODUODENOSCOPY  11/23/2011   Procedure: ESOPHAGOGASTRODUODENOSCOPY (EGD);  Surgeon: Owens Loffler, MD;  Location: [facility] ENDOSCOPY;  Service: Endoscopy;  Laterality: N/A;   INTERCOSTAL NERVE BLOCK Left 01/01/2019   Procedure: INTERCOSTAL NERVE BLOCK;  Surgeon: Grace Isaac, MD;  Location: [place];  Service: Thoracic;  Laterality: Left;   LUNG BIOPSY  12/18/2018   Procedure: LUNG BIOPSY - of Left Upper Lobe Biopsy of #7 Node Biopsy of #10L Node;  Surgeon: Grace Isaac, MD;  Location: [place];  Service: Thoracic;;   LYMPH NODE DISSECTION Left 01/01/2019   Procedure: LYMPH NODE DISSECTION;  Surgeon: Grace Isaac, MD;  Location: [place];  Service: Thoracic;  Laterality: Left;   UPPER GASTROINTESTINAL ENDOSCOPY  11/23/11   VIDEO ASSISTED THORACOSCOPY (VATS)/WEDGE RESECTION Left 01/01/2019   Procedure: VIDEO  ASSISTED THORACOSCOPY (VATS)/ LEFT LINGULECTOMY;  Surgeon: Grace Isaac, MD;  Location: [place];  Service: Thoracic;  Laterality: Left;   VIDEO BRONCHOSCOPY WITH ENDOBRONCHIAL NAVIGATION N/A 12/18/2018   Procedure: VIDEO BRONCHOSCOPY WITH ENDOBRONCHIAL NAVIGATION with Placement of Fiducial Markers x 3;  Surgeon: Grace Isaac, MD;  Location: [place];  Service: Thoracic;  Laterality: N/A;   VIDEO BRONCHOSCOPY WITH ENDOBRONCHIAL ULTRASOUND N/A 12/18/2018   Procedure: VIDEO BRONCHOSCOPY WITH ENDOBRONCHIAL ULTRASOUND;  Surgeon: Grace Isaac, MD;  Location: [place];  Service: Thoracic;  Laterality: N/A;   VIDEO BRONCHOSCOPY WITH ENDOBRONCHIAL ULTRASOUND Left 01/10/2022   Procedure: VIDEO BRONCHOSCOPY WITH ENDOBRONCHIAL ULTRASOUND;  Surgeon: Collene Gobble, MD;  Location: [place];  Service: Cardiopulmonary;  Laterality: Left;    REVIEW OF SYSTEMS:  A comprehensive review of systems was negative except for: Ears, nose, mouth, throat, and face: positive for sore throat   PHYSICAL EXAMINATION: General appearance: alert, cooperative, fatigued, and no distress Head: Normocephalic, without obvious abnormality, atraumatic Neck: no adenopathy, no JVD, supple, symmetrical, trachea midline, and thyroid not enlarged, symmetric, no tenderness/mass/nodules Lymph nodes: Cervical, supraclavicular, and axillary nodes normal. Resp: clear to auscultation bilaterally Back: symmetric, no curvature. ROM normal. No CVA tenderness. Cardio: regular rate and rhythm, S1, S2 normal, no murmur, click, rub or gallop GI: soft, non-tender; bowel sounds normal; no masses,  no organomegaly Extremities: extremities normal, atraumatic, no cyanosis or edema  ECOG PERFORMANCE STATUS: 1 - Symptomatic but completely ambulatory  Blood pressure 121/62, pulse (!) 52, temperature (!) 97.5 F (36.4 C), resp. rate 16, weight 181 lb 12.8 oz (82.5 kg), SpO2 96 %.  LABORATORY DATA: Lab Results  Component Value Date   WBC 3.6  (L) 02/14/2022   HGB 11.6 (L) 02/14/2022   HCT 33.8 (L) 02/14/2022   MCV 93.9 02/14/2022   PLT 125 (L) 02/14/2022      Chemistry      Component Value Date/Time   NA 138 02/07/2022 1201   K 4.2 02/07/2022 1201   CL 107 02/07/2022 1201   CO2 24 02/07/2022 1201   BUN 16 02/07/2022 1201   CREATININE 1.63 (H) 02/07/2022 1201      Component Value Date/Time   CALCIUM 8.9 02/07/2022 1201   ALKPHOS 54 02/07/2022 1201   AST 12 (L) 02/07/2022 1201   ALT 18 02/07/2022 1201   BILITOT 1.0 02/07/2022 1201       RADIOGRAPHIC STUDIES: MR Brain W Wo Contrast  Result Date: 01/21/2022 CLINICAL DATA:  Adenocarcinoma of the lung.  Intracranial staging. EXAM: MRI HEAD WITHOUT AND WITH CONTRAST TECHNIQUE: Multiplanar, multiecho pulse sequences of the brain and surrounding structures were obtained without and with intravenous contrast. CONTRAST:  7.57mL GADAVIST GADOBUTROL 1 MMOL/ML IV SOLN COMPARISON:  None. FINDINGS: Brain: No abnormal swelling or enhancement. Cystic space in the deep left cerebellum with rim of gliosis and central CSF signal. No superimposed enhancement, appearance consistent with chronic lacune. Mild chronic small vessel ischemia in the  supratentorial white matter. No acute or subacute infarct, hemorrhage, hydrocephalus, or collection. Age normal brain volume Vascular: Major flow voids and vascular enhancements are preserved. Skull and upper cervical spine: Negative for lesion. Sinuses/Orbits: Negative IMPRESSION: Negative for metastatic disease to the brain. Electronically Signed   By: Jorje Guild M.D.   On: 01/21/2022 07:49    ASSESSMENT AND PLAN: This is a very pleasant 76 years old white male diagnosed with recurrent non-small cell lung cancer that was initially diagnosed as stage IA (T1b, N0, M0) non-small cell lung cancer, adenocarcinoma presented with left upper lobe lung nodule in January 2020.  The patient has evidence for disease recurrence in AP window lymphadenopathy in  December 2022.  He is status post left lingulectomy with lymph node dissection under the care of Dr. Servando Snare on January 01, 2019.  This was biopsy-proven to be recurrent adenocarcinoma of the lung. The patient is currently undergoing a course of concurrent chemoradiation with weekly carboplatin for AUC of 2 and paclitaxel 45 Mg/M2 status post 3 cycles.  He has been tolerating his treatment well with no concerning adverse effects. I recommended for the patient to proceed with cycle #4 today as planned. The patient will come back for follow-up visit in 2 weeks for evaluation before starting cycle #6. He was advised to call immediately if he has any other concerning symptoms in the interval. The patient voices understanding of current disease status and treatment options and is in agreement with the current care plan.  All questions were answered. The patient knows to call the clinic with any problems, questions or concerns. We can certainly see the patient much sooner if necessary.  Disclaimer: This note was dictated with voice recognition software. Similar sounding words can inadvertently be transcribed and may not be corrected upon review.

## 2022-02-15 ENCOUNTER — Other Ambulatory Visit: Payer: Self-pay | Admitting: Radiation Oncology

## 2022-02-15 ENCOUNTER — Ambulatory Visit
Admission: RE | Admit: 2022-02-15 | Discharge: 2022-02-15 | Disposition: A | Discharge status: Home / Self Care | Payer: PPO | Source: Ambulatory Visit | Attending: Radiation Oncology | Admitting: Radiation Oncology

## 2022-02-15 DIAGNOSIS — Z510 Encounter for antineoplastic radiation therapy: Secondary | ICD-10-CM | POA: Diagnosis not present

## 2022-02-15 MED ORDER — SUCRALFATE 1 G PO TABS: 1.0000 g | ORAL_TABLET | Freq: Three times a day (TID) | ORAL | 1 refills | Status: DC

## 2022-02-16 ENCOUNTER — Ambulatory Visit
Admission: RE | Admit: 2022-02-16 | Discharge: 2022-02-16 | Disposition: A | Discharge status: Home / Self Care | Payer: PPO | Source: Ambulatory Visit | Attending: Radiation Oncology | Admitting: Radiation Oncology

## 2022-02-16 ENCOUNTER — Other Ambulatory Visit: Payer: Self-pay

## 2022-02-16 DIAGNOSIS — Z510 Encounter for antineoplastic radiation therapy: Secondary | ICD-10-CM | POA: Diagnosis not present

## 2022-02-17 ENCOUNTER — Ambulatory Visit
Admission: RE | Admit: 2022-02-17 | Discharge: 2022-02-17 | Disposition: A | Discharge status: Home / Self Care | Payer: PPO | Source: Ambulatory Visit | Attending: Radiation Oncology | Admitting: Radiation Oncology

## 2022-02-17 DIAGNOSIS — Z510 Encounter for antineoplastic radiation therapy: Secondary | ICD-10-CM | POA: Diagnosis not present

## 2022-02-18 ENCOUNTER — Other Ambulatory Visit: Payer: Self-pay

## 2022-02-18 ENCOUNTER — Ambulatory Visit
Admission: RE | Admit: 2022-02-18 | Discharge: 2022-02-18 | Disposition: A | Discharge status: Home / Self Care | Payer: PPO | Source: Ambulatory Visit | Attending: Radiation Oncology | Admitting: Radiation Oncology

## 2022-02-18 DIAGNOSIS — Z510 Encounter for antineoplastic radiation therapy: Secondary | ICD-10-CM | POA: Diagnosis not present

## 2022-02-21 ENCOUNTER — Inpatient Hospital Stay: Payer: PPO

## 2022-02-21 ENCOUNTER — Ambulatory Visit
Admission: RE | Admit: 2022-02-21 | Discharge: 2022-02-21 | Disposition: A | Discharge status: Home / Self Care | Payer: PPO | Source: Ambulatory Visit | Attending: Radiation Oncology | Admitting: Radiation Oncology

## 2022-02-21 ENCOUNTER — Other Ambulatory Visit: Payer: Self-pay

## 2022-02-21 VITALS — BP 111/58 | HR 56 | Temp 97.7°F | Resp 16 | Wt 182.0 lb

## 2022-02-21 DIAGNOSIS — Z510 Encounter for antineoplastic radiation therapy: Secondary | ICD-10-CM | POA: Diagnosis not present

## 2022-02-21 DIAGNOSIS — C3492 Malignant neoplasm of unspecified part of left bronchus or lung: Secondary | ICD-10-CM

## 2022-02-21 LAB — CBC WITH DIFFERENTIAL (CANCER CENTER ONLY)
Abs Immature Granulocytes: 0.02 10*3/uL (ref 0.00–0.07)
Basophils Absolute: 0 10*3/uL (ref 0.0–0.1)
Basophils Relative: 1 %
Eosinophils Absolute: 0 10*3/uL (ref 0.0–0.5)
Eosinophils Relative: 1 %
HCT: 33.6 % — ABNORMAL LOW (ref 39.0–52.0)
Hemoglobin: 11.6 g/dL — ABNORMAL LOW (ref 13.0–17.0)
Immature Granulocytes: 1 %
Lymphocytes Relative: 22 %
Lymphs Abs: 0.6 10*3/uL — ABNORMAL LOW (ref 0.7–4.0)
MCH: 32.5 pg (ref 26.0–34.0)
MCHC: 34.5 g/dL (ref 30.0–36.0)
MCV: 94.1 fL (ref 80.0–100.0)
Monocytes Absolute: 0.2 10*3/uL (ref 0.1–1.0)
Monocytes Relative: 8 %
Neutro Abs: 2 10*3/uL (ref 1.7–7.7)
Neutrophils Relative %: 67 %
Platelet Count: 98 10*3/uL — ABNORMAL LOW (ref 150–400)
RBC: 3.57 MIL/uL — ABNORMAL LOW (ref 4.22–5.81)
RDW: 13.6 % (ref 11.5–15.5)
WBC Count: 2.9 10*3/uL — ABNORMAL LOW (ref 4.0–10.5)
nRBC: 0 % (ref 0.0–0.2)

## 2022-02-21 LAB — CMP (CANCER CENTER ONLY)
ALT: 19 U/L (ref 0–44)
AST: 13 U/L — ABNORMAL LOW (ref 15–41)
Albumin: 3.8 g/dL (ref 3.5–5.0)
Alkaline Phosphatase: 53 U/L (ref 38–126)
Anion gap: 6 (ref 5–15)
BUN: 16 mg/dL (ref 8–23)
CO2: 26 mmol/L (ref 22–32)
Calcium: 8.8 mg/dL — ABNORMAL LOW (ref 8.9–10.3)
Chloride: 108 mmol/L (ref 98–111)
Creatinine: 1.59 mg/dL — ABNORMAL HIGH (ref 0.61–1.24)
GFR, Estimated: 45 mL/min — ABNORMAL LOW (ref >60)
Glucose, Bld: 121 mg/dL — ABNORMAL HIGH (ref 70–99)
Potassium: 4.3 mmol/L (ref 3.5–5.1)
Sodium: 140 mmol/L (ref 135–145)
Total Bilirubin: 0.6 mg/dL (ref 0.3–1.2)
Total Protein: 6.4 g/dL — ABNORMAL LOW (ref 6.5–8.1)

## 2022-02-21 MED ORDER — FAMOTIDINE IN NACL 20-0.9 MG/50ML-% IV SOLN
20.0000 mg | Freq: Once | INTRAVENOUS | Status: AC
  Administered 2022-02-21: 20 mg via INTRAVENOUS
  Filled 2022-02-21: qty 50

## 2022-02-21 MED ORDER — SODIUM CHLORIDE 0.9 % IV SOLN: Freq: Once | INTRAVENOUS | Status: AC

## 2022-02-21 MED ORDER — PALONOSETRON HCL INJECTION 0.25 MG/5ML
0.2500 mg | Freq: Once | INTRAVENOUS | Status: AC
  Administered 2022-02-21: 0.25 mg via INTRAVENOUS
  Filled 2022-02-21: qty 5

## 2022-02-21 MED ORDER — SODIUM CHLORIDE 0.9 % IV SOLN
132.0000 mg | Freq: Once | INTRAVENOUS | Status: AC
  Administered 2022-02-21: 130 mg via INTRAVENOUS
  Filled 2022-02-21: qty 13

## 2022-02-21 MED ORDER — SODIUM CHLORIDE 0.9 % IV SOLN
10.0000 mg | Freq: Once | INTRAVENOUS | Status: AC
  Administered 2022-02-21: 10 mg via INTRAVENOUS
  Filled 2022-02-21: qty 10

## 2022-02-21 MED ORDER — SODIUM CHLORIDE 0.9 % IV SOLN
45.0000 mg/m2 | Freq: Once | INTRAVENOUS | Status: AC
  Administered 2022-02-21: 90 mg via INTRAVENOUS
  Filled 2022-02-21: qty 15

## 2022-02-21 MED ORDER — DIPHENHYDRAMINE HCL 50 MG/ML IJ SOLN
50.0000 mg | Freq: Once | INTRAMUSCULAR | Status: AC
  Administered 2022-02-21: 50 mg via INTRAVENOUS

## 2022-02-21 NOTE — Progress Notes (Signed)
Per Dr. Julien Nordmann, [place] to trt today w/ Plts 98 and SCR 1.59 ?

## 2022-02-21 NOTE — Patient Instructions (Signed)
[facility]ICAL [facility]COLOG[facility]  Discharge Instructions: Thank you for choosing [facility] to provide your oncology and hematology care.   If you have a lab appointment with the Cancer Center, please go directly to the Cancer Center and check in at the registration area.   Wear comfortable clothing and clothing appropriate for easy access to any Portacath or PICC line.   We strive to give you quality time with your provider. You may need to reschedule your appointment if you arrive late (15 or more minutes).  Arriving late affects you and other patients whose appointments are after yours.  Also, if you miss three or more appointments without notifying the office, you may be dismissed from the clinic at the provider's discretion.      For prescription refill requests, have your pharmacy contact our office and allow 72 hours for refills to be completed.    Today you received the following chemotherapy and/or immunotherapy agents: Taxol & Carboplatin   To help prevent nausea and vomiting after your treatment, we encourage you to take your nausea medication as directed.  BELOW ARE SYMPTOMS THAT SHOULD BE REPORTED IMMEDIATELY: *FEVER GREATER THAN 100.4 F (38 C) OR HIGHER *CHILLS OR SWEATING *NAUSEA AND VOMITING THAT IS NOT CONTROLLED WITH YOUR NAUSEA MEDICATION *UNUSUAL SHORTNESS OF BREATH *UNUSUAL BRUISING OR BLEEDING *URINARY PROBLEMS (pain or burning when urinating, or frequent urination) *BOWEL PROBLEMS (unusual diarrhea, constipation, pain near the anus) TENDERNESS IN MOUTH AND THROAT WITH OR WITHOUT PRESENCE OF ULCERS (sore throat, sores in mouth, or a toothache) UNUSUAL RASH, SWELLING OR PAIN  UNUSUAL VAGINAL DISCHARGE OR ITCHING   Items with * indicate a potential emergency and should be followed up as soon as possible or go to the Emergency Department if any problems should occur.  Please show the CHEMOTHERAPY ALERT CARD or IMMUNOTHERAPY ALERT CARD at  check-in to the Emergency Department and triage nurse.  Should you have questions after your visit or need to cancel or reschedule your appointment, please contact [facility]  Dept: [phone]  and follow the prompts.  Office hours are 8:00 a.m. to 4:30 p.m. Monday - Friday. Please note that voicemails left after 4:00 p.m. may not be returned until the following business day.  We are closed weekends and major holidays. You have access to a nurse at all times for urgent questions. Please call the main number to the clinic Dept: [phone] and follow the prompts.   For any non-urgent questions, you may also contact your provider using MyChart. We now offer e-Visits for anyone 18 and older to request care online for non-urgent symptoms. For details visit [email or web address].   Also download the MyChart app! Go to the app store, search "MyChart", open the app, select [facility], and log in with your MyChart username and password.  Due to Covid, a mask is required upon entering the hospital/clinic. If you do not have a mask, one will be given to you upon arrival. For doctor visits, patients may have 1 support person aged 18 or older with them. For treatment visits, patients cannot have anyone with them due to current Covid guidelines and our immunocompromised population.   

## 2022-02-22 ENCOUNTER — Ambulatory Visit
Admission: RE | Admit: 2022-02-22 | Discharge: 2022-02-22 | Disposition: A | Discharge status: Home / Self Care | Payer: PPO | Source: Ambulatory Visit | Attending: Radiation Oncology | Admitting: Radiation Oncology

## 2022-02-22 DIAGNOSIS — Z510 Encounter for antineoplastic radiation therapy: Secondary | ICD-10-CM | POA: Diagnosis not present

## 2022-02-23 ENCOUNTER — Other Ambulatory Visit: Payer: Self-pay

## 2022-02-23 ENCOUNTER — Ambulatory Visit
Admission: RE | Admit: 2022-02-23 | Discharge: 2022-02-23 | Disposition: A | Discharge status: Home / Self Care | Payer: PPO | Source: Ambulatory Visit | Attending: Radiation Oncology | Admitting: Radiation Oncology

## 2022-02-23 DIAGNOSIS — Z510 Encounter for antineoplastic radiation therapy: Secondary | ICD-10-CM | POA: Diagnosis not present

## 2022-02-23 NOTE — Progress Notes (Deleted)
[place] ?OFFICE PROGRESS NOTE ? ?[name], MD ?[address] [place] [address] ? ?DIAGNOSIS: Recurrent non-small cell lung cancer initially diagnosed as stage IA (T1b, N0, M0) non-small cell lung cancer, adenocarcinoma presented with left upper lobe lung nodule diagnosed in January 2020.  The patient has evidence for disease recurrence in AP window lymphadenopathy in December 2022. ?  ?There was insufficient material for the molecular studies by [facility]. ? ?PRIOR THERAPY: Status post left lingulectomy with lymph node dissection under the care of Dr. Servando Snare on January 01, 2019 ? ?CURRENT THERAPY: Concurrent chemoradiation with carboplatin for an AUC of 2 and paclitaxel 45 mg per metered squared weekly.  First dose on 01/24/2022.  Status post 5 cycles.  ? ?INTERVAL HISTORY: ?Jack Taylor 76 y.o. male returns  to the clinic today for a follow-up visit accompanied by his wife.  The patient is feeling fairly well today without any concerning complaints.  The patient recently was found to have recurrent non-small cell lung cancer, adenocarcinoma.  Therefore, the patient is currently undergoing treatment with weekly concurrent chemoradiation.  He status post 5 cycles of chemotherapy and tolerated well without any concerning adverse side effects except for he is starting to develop some odynophagia. His last day of radiation is scheduled for 03/18/22.   ? ?The patient tolerated his first cycle of treatment well.  Denies any fever, chills, night sweats, or unexplained weight loss.  Denies any cough, chest pain, or hemoptysis.  He denies significant dyspnea on exertion. Denies any nausea, vomiting, or diarrhea. He noticed he has been having bowel movements every other day as opposed to daily. He has not tried taking stool softeners. Denies any headache or visual changes.  The patient is here today for evaluation and repeat blood work before starting cycle #6 ? ?MEDICAL  HISTORY: ?Past Medical History:  ?Diagnosis Date  ? Adenomatous colon polyp   ? Anxiety   ? Blood transfusion without reported diagnosis   ? Cancer [facility])   ? Lung; 11/2018  ? CKD (chronic kidney disease)   ? stage III 11/09/18  ? COPD (chronic obstructive pulmonary disease) ([place])   ? Gastric ulcer   ? GERD (gastroesophageal reflux disease)   ? High cholesterol   ? Hypertension   ? Pre-diabetes   ? A1c 6.2% 11/09/18  ? ? ?ALLERGIES:  has No Known Allergies. ? ?MEDICATIONS:  ?Current Outpatient Medications  ?Medication Sig Dispense Refill  ? albuterol (PROVENTIL HFA;VENTOLIN HFA) 108 (90 Base) MCG/ACT inhaler Inhale 2 puffs into the lungs every 6 (six) hours as needed for wheezing or shortness of breath. Reported on 06/02/2016 (Patient not taking: Reported on 02/14/2022)    ? amLODipine (NORVASC) 10 MG tablet Take 10 mg by mouth daily.     ? ANORO ELLIPTA 62.5-25 MCG/ACT AEPB Inhale 1 puff into the lungs daily.    ? esomeprazole (NEXIUM) 40 MG capsule Take 40 mg by mouth daily.     ? losartan (COZAAR) 50 MG tablet Take 50 mg by mouth daily.     ? prochlorperazine (COMPAZINE) 10 MG tablet Take 1 tablet (10 mg total) by mouth every 6 (six) hours as needed. 30 tablet 2  ? rosuvastatin (CRESTOR) 10 MG tablet Take 10 mg by mouth daily.    ? sucralfate (CARAFATE) 1 g tablet Take 1 tablet (1 g total) by mouth 4 (four) times daily -  with meals and at bedtime. Crush and dissolve in 10 mL of warm  water prior to swallowing 120 tablet 1  ? traZODone (DESYREL) 50 MG tablet Take 50 mg by mouth at bedtime.    ? ?No current facility-administered medications for this visit.  ? ? ?SURGICAL HISTORY:  ?Past Surgical History:  ?Procedure Laterality Date  ? BRONCHIAL NEEDLE ASPIRATION BIOPSY  01/10/2022  ? Procedure: BRONCHIAL NEEDLE ASPIRATION BIOPSIES;  Surgeon: Collene Gobble, MD;  Location: [facility] ENDOSCOPY;  Service: Cardiopulmonary;;  ? COLONOSCOPY  2008  ? ESOPHAGOGASTRODUODENOSCOPY  11/23/2011  ? Procedure: ESOPHAGOGASTRODUODENOSCOPY  (EGD);  Surgeon: Owens Loffler, MD;  Location: [facility] ENDOSCOPY;  Service: Endoscopy;  Laterality: N/A;  ? INTERCOSTAL NERVE BLOCK Left 01/01/2019  ? Procedure: INTERCOSTAL NERVE BLOCK;  Surgeon: Grace Isaac, MD;  Location: [place];  Service: Thoracic;  Laterality: Left;  ? LUNG BIOPSY  12/18/2018  ? Procedure: LUNG BIOPSY - of Left Upper Lobe Biopsy of #7 Node Biopsy of #10L Node;  Surgeon: Grace Isaac, MD;  Location: [place];  Service: Thoracic;;  ? LYMPH NODE DISSECTION Left 01/01/2019  ? Procedure: LYMPH NODE DISSECTION;  Surgeon: Grace Isaac, MD;  Location: [facility];  Service: Thoracic;  Laterality: Left;  ? UPPER GASTROINTESTINAL ENDOSCOPY  11/23/11  ? VIDEO ASSISTED THORACOSCOPY (VATS)/WEDGE RESECTION Left 01/01/2019  ? Procedure: VIDEO ASSISTED THORACOSCOPY (VATS)/ LEFT LINGULECTOMY;  Surgeon: Grace Isaac, MD;  Location: [place];  Service: Thoracic;  Laterality: Left;  ? VIDEO BRONCHOSCOPY WITH ENDOBRONCHIAL NAVIGATION N/A 12/18/2018  ? Procedure: VIDEO BRONCHOSCOPY WITH ENDOBRONCHIAL NAVIGATION with Placement of Fiducial Markers x 3;  Surgeon: Grace Isaac, MD;  Location: [facility] OR;  Service: Thoracic;  Laterality: N/A;  ? VIDEO BRONCHOSCOPY WITH ENDOBRONCHIAL ULTRASOUND N/A 12/18/2018  ? Procedure: VIDEO BRONCHOSCOPY WITH ENDOBRONCHIAL ULTRASOUND;  Surgeon: Grace Isaac, MD;  Location: [facility];  Service: Thoracic;  Laterality: N/A;  ? VIDEO BRONCHOSCOPY WITH ENDOBRONCHIAL ULTRASOUND Left 01/10/2022  ? Procedure: VIDEO BRONCHOSCOPY WITH ENDOBRONCHIAL ULTRASOUND;  Surgeon: Collene Gobble, MD;  Location: [facility] ENDOSCOPY;  Service: Cardiopulmonary;  Laterality: Left;  ? ? ?REVIEW OF SYSTEMS:   ?Review of Systems  ?Constitutional: Negative for appetite change, chills, fatigue, fever and unexpected weight change.  ?HENT:   Negative for mouth sores, nosebleeds, sore throat and trouble swallowing.   ?Eyes: Negative for eye problems and icterus.  ?Respiratory: Negative for cough, hemoptysis, shortness of  breath and wheezing.   ?Cardiovascular: Negative for chest pain and leg swelling.  ?Gastrointestinal: Negative for abdominal pain, constipation, diarrhea, nausea and vomiting.  ?Genitourinary: Negative for bladder incontinence, difficulty urinating, dysuria, frequency and hematuria.   ?Musculoskeletal: Negative for back pain, gait problem, neck pain and neck stiffness.  ?Skin: Negative for itching and rash.  ?Neurological: Negative for dizziness, extremity weakness, gait problem, headaches, light-headedness and seizures.  ?Hematological: Negative for adenopathy. Does not bruise/bleed easily.  ?Psychiatric/Behavioral: Negative for confusion, depression and sleep disturbance. The patient is not nervous/anxious.   ? ? ?PHYSICAL EXAMINATION:  ?There were no vitals taken for this visit. ? ?ECOG PERFORMANCE STATUS: {CHL ONC ECOG VQ:[id number]} ? ?Physical Exam  ?Constitutional: Oriented to person, place, and time and well-developed, well-nourished, and in no distress. No distress.  ?HENT:  ?Head: Normocephalic and atraumatic.  ?Mouth/Throat: Oropharynx is clear and moist. No oropharyngeal exudate.  ?Eyes: Conjunctivae are normal. Right eye exhibits no discharge. Left eye exhibits no discharge. No scleral icterus.  ?Neck: Normal range of motion. Neck supple.  ?Cardiovascular: Normal rate, regular rhythm, normal heart sounds and intact distal pulses.   ?Pulmonary/Chest: Effort normal and  breath sounds normal. No respiratory distress. No wheezes. No rales.  ?Abdominal: Soft. Bowel sounds are normal. Exhibits no distension and no mass. There is no tenderness.  ?Musculoskeletal: Normal range of motion. Exhibits no edema.  ?Lymphadenopathy:  ?  No cervical adenopathy.  ?Neurological: Alert and oriented to person, place, and time. Exhibits normal muscle tone. Gait normal. Coordination normal.  ?Skin: Skin is warm and dry. No rash noted. Not diaphoretic. No erythema. No pallor.  ?Psychiatric: Mood, memory and judgment normal.   ?Vitals reviewed. ? ?LABORATORY DATA: ?Lab Results  ?Component Value Date  ? WBC 2.9 (L) 02/21/2022  ? HGB 11.6 (L) 02/21/2022  ? HCT 33.6 (L) 02/21/2022  ? MCV 94.1 02/21/2022  ? PLT 98 (L) 02/21/2022  ? ? ?  C

## 2022-02-24 ENCOUNTER — Ambulatory Visit
Admission: RE | Admit: 2022-02-24 | Discharge: 2022-02-24 | Disposition: A | Discharge status: Home / Self Care | Payer: PPO | Source: Ambulatory Visit | Attending: Radiation Oncology | Admitting: Radiation Oncology

## 2022-02-24 DIAGNOSIS — Z510 Encounter for antineoplastic radiation therapy: Secondary | ICD-10-CM | POA: Diagnosis not present

## 2022-02-25 ENCOUNTER — Ambulatory Visit
Admission: RE | Admit: 2022-02-25 | Discharge: 2022-02-25 | Disposition: A | Discharge status: Home / Self Care | Payer: PPO | Source: Ambulatory Visit | Attending: Radiation Oncology | Admitting: Radiation Oncology

## 2022-02-25 ENCOUNTER — Telehealth: Payer: Self-pay | Admitting: Internal Medicine

## 2022-02-25 ENCOUNTER — Other Ambulatory Visit: Payer: Self-pay

## 2022-02-25 DIAGNOSIS — Z510 Encounter for antineoplastic radiation therapy: Secondary | ICD-10-CM | POA: Diagnosis not present

## 2022-02-25 NOTE — Telephone Encounter (Signed)
Called patient regarding 03/27 appointment, left a voicemail. ?

## 2022-02-28 ENCOUNTER — Inpatient Hospital Stay: Payer: PPO

## 2022-02-28 ENCOUNTER — Inpatient Hospital Stay: Payer: PPO | Admitting: Internal Medicine

## 2022-02-28 ENCOUNTER — Encounter: Payer: Self-pay | Admitting: Internal Medicine

## 2022-02-28 ENCOUNTER — Other Ambulatory Visit: Payer: Self-pay

## 2022-02-28 ENCOUNTER — Ambulatory Visit
Admission: RE | Admit: 2022-02-28 | Discharge: 2022-02-28 | Disposition: A | Discharge status: Home / Self Care | Payer: PPO | Source: Ambulatory Visit | Attending: Radiation Oncology | Admitting: Radiation Oncology

## 2022-02-28 VITALS — BP 117/58 | HR 61 | Temp 97.9°F | Resp 18 | Ht 68.0 in | Wt 179.1 lb

## 2022-02-28 DIAGNOSIS — C3492 Malignant neoplasm of unspecified part of left bronchus or lung: Secondary | ICD-10-CM

## 2022-02-28 DIAGNOSIS — Z510 Encounter for antineoplastic radiation therapy: Secondary | ICD-10-CM | POA: Diagnosis not present

## 2022-02-28 DIAGNOSIS — Z5111 Encounter for antineoplastic chemotherapy: Secondary | ICD-10-CM | POA: Diagnosis not present

## 2022-02-28 LAB — CBC WITH DIFFERENTIAL (CANCER CENTER ONLY)
Abs Immature Granulocytes: 0.01 10*3/uL (ref 0.00–0.07)
Basophils Absolute: 0 10*3/uL (ref 0.0–0.1)
Basophils Relative: 0 %
Eosinophils Absolute: 0 10*3/uL (ref 0.0–0.5)
Eosinophils Relative: 1 %
HCT: 33.7 % — ABNORMAL LOW (ref 39.0–52.0)
Hemoglobin: 11.7 g/dL — ABNORMAL LOW (ref 13.0–17.0)
Immature Granulocytes: 0 %
Lymphocytes Relative: 20 %
Lymphs Abs: 0.5 10*3/uL — ABNORMAL LOW (ref 0.7–4.0)
MCH: 32.6 pg (ref 26.0–34.0)
MCHC: 34.7 g/dL (ref 30.0–36.0)
MCV: 93.9 fL (ref 80.0–100.0)
Monocytes Absolute: 0.2 10*3/uL (ref 0.1–1.0)
Monocytes Relative: 7 %
Neutro Abs: 1.8 10*3/uL (ref 1.7–7.7)
Neutrophils Relative %: 72 %
Platelet Count: 69 10*3/uL — ABNORMAL LOW (ref 150–400)
RBC: 3.59 MIL/uL — ABNORMAL LOW (ref 4.22–5.81)
RDW: 13.6 % (ref 11.5–15.5)
WBC Count: 2.5 10*3/uL — ABNORMAL LOW (ref 4.0–10.5)
nRBC: 0 % (ref 0.0–0.2)

## 2022-02-28 LAB — CMP (CANCER CENTER ONLY)
ALT: 20 U/L (ref 0–44)
AST: 15 U/L (ref 15–41)
Albumin: 3.9 g/dL (ref 3.5–5.0)
Alkaline Phosphatase: 60 U/L (ref 38–126)
Anion gap: 7 (ref 5–15)
BUN: 20 mg/dL (ref 8–23)
CO2: 25 mmol/L (ref 22–32)
Calcium: 8.9 mg/dL (ref 8.9–10.3)
Chloride: 108 mmol/L (ref 98–111)
Creatinine: 1.62 mg/dL — ABNORMAL HIGH (ref 0.61–1.24)
GFR, Estimated: 44 mL/min — ABNORMAL LOW (ref >60)
Glucose, Bld: 145 mg/dL — ABNORMAL HIGH (ref 70–99)
Potassium: 3.9 mmol/L (ref 3.5–5.1)
Sodium: 140 mmol/L (ref 135–145)
Total Bilirubin: 0.6 mg/dL (ref 0.3–1.2)
Total Protein: 6.6 g/dL (ref 6.5–8.1)

## 2022-02-28 NOTE — Progress Notes (Signed)
?    [place] ?Telephone:([phone]   [phone] ? ?OFFICE PROGRESS NOTE ? ?Jack Major, MD ?[address] [place] [address] ? ?DIAGNOSIS: Recurrent non-small cell lung cancer initially diagnosed as stage IA (T1b, N0, M0) non-small cell lung cancer, adenocarcinoma presented with left upper lobe lung nodule diagnosed in January 2020.  The patient has evidence for disease recurrence in AP window lymphadenopathy in December 2022. ?  ?There was insufficient material for the molecular studies by foundation medicine. ?  ?PRIOR THERAPY: Status post left lingulectomy with lymph node dissection under the care of Dr. Servando Snare on January 01, 2019 ?  ?CURRENT THERAPY: Concurrent chemoradiation with carboplatin for an AUC of 2 and paclitaxel 45 mg per metered squared weekly.  First dose on 01/24/2022.  Status post 5 cycles.  ? ?INTERVAL HISTORY: ?Jack Taylor 76 y.o. male returns to the clinic today for follow-up visit accompanied by his wife.  The patient is feeling fine today with no concerning complaints except for some soreness in his throat from the radiotherapy.  He is currently on Carafate.  He still have good appetite.  He denied having any chest pain, shortness of breath, cough or hemoptysis.  He has no nausea, vomiting, diarrhea or constipation.  He has no headache or visual changes.  He denied having any significant weight loss or night sweats.  He is here today for evaluation before starting cycle #6 of his treatment. ? ?MEDICAL HISTORY: ?Past Medical History:  ?Diagnosis Date  ? Adenomatous colon polyp   ? Anxiety   ? Blood transfusion without reported diagnosis   ? Cancer [facility])   ? Lung; 11/2018  ? CKD (chronic kidney disease)   ? stage III 11/09/18  ? COPD (chronic obstructive pulmonary disease) ([place])   ? Gastric ulcer   ? GERD (gastroesophageal reflux disease)   ? High cholesterol   ? Hypertension   ? Pre-diabetes   ? A1c 6.2% 11/09/18  ? ? ?ALLERGIES:  has No Known  Allergies. ? ?MEDICATIONS:  ?Current Outpatient Medications  ?Medication Sig Dispense Refill  ? amLODipine (NORVASC) 10 MG tablet Take 10 mg by mouth daily.     ? ANORO ELLIPTA 62.5-25 MCG/ACT AEPB Inhale 1 puff into the lungs daily.    ? esomeprazole (NEXIUM) 40 MG capsule Take 40 mg by mouth daily.     ? losartan (COZAAR) 50 MG tablet Take 50 mg by mouth daily.     ? rosuvastatin (CRESTOR) 10 MG tablet Take 10 mg by mouth daily.    ? sucralfate (CARAFATE) 1 g tablet Take 1 tablet (1 g total) by mouth 4 (four) times daily -  with meals and at bedtime. Crush and dissolve in 10 mL of warm water prior to swallowing 120 tablet 1  ? traZODone (DESYREL) 50 MG tablet Take 50 mg by mouth at bedtime.    ? albuterol (PROVENTIL HFA;VENTOLIN HFA) 108 (90 Base) MCG/ACT inhaler Inhale 2 puffs into the lungs every 6 (six) hours as needed for wheezing or shortness of breath. Reported on 06/02/2016 (Patient not taking: Reported on 02/14/2022)    ? prochlorperazine (COMPAZINE) 10 MG tablet Take 1 tablet (10 mg total) by mouth every 6 (six) hours as needed. (Patient not taking: Reported on 02/28/2022) 30 tablet 2  ? ?No current facility-administered medications for this visit.  ? ? ?SURGICAL HISTORY:  ?Past Surgical History:  ?Procedure Laterality Date  ? BRONCHIAL NEEDLE ASPIRATION BIOPSY  01/10/2022  ? Procedure:  BRONCHIAL NEEDLE ASPIRATION BIOPSIES;  Surgeon: Collene Gobble, MD;  Location: [facility] ENDOSCOPY;  Service: Cardiopulmonary;;  ? COLONOSCOPY  2008  ? ESOPHAGOGASTRODUODENOSCOPY  11/23/2011  ? Procedure: ESOPHAGOGASTRODUODENOSCOPY (EGD);  Surgeon: Owens Loffler, MD;  Location: [facility] ENDOSCOPY;  Service: Endoscopy;  Laterality: N/A;  ? INTERCOSTAL NERVE BLOCK Left 01/01/2019  ? Procedure: INTERCOSTAL NERVE BLOCK;  Surgeon: Grace Isaac, MD;  Location: [place];  Service: Thoracic;  Laterality: Left;  ? LUNG BIOPSY  12/18/2018  ? Procedure: LUNG BIOPSY - of Left Upper Lobe Biopsy of #7 Node Biopsy of #10L Node;  Surgeon: Grace Isaac, MD;  Location: [place];  Service: Thoracic;;  ? LYMPH NODE DISSECTION Left 01/01/2019  ? Procedure: LYMPH NODE DISSECTION;  Surgeon: Grace Isaac, MD;  Location: [place];  Service: Thoracic;  Laterality: Left;  ? UPPER GASTROINTESTINAL ENDOSCOPY  11/23/11  ? VIDEO ASSISTED THORACOSCOPY (VATS)/WEDGE RESECTION Left 01/01/2019  ? Procedure: VIDEO ASSISTED THORACOSCOPY (VATS)/ LEFT LINGULECTOMY;  Surgeon: Grace Isaac, MD;  Location: [place];  Service: Thoracic;  Laterality: Left;  ? VIDEO BRONCHOSCOPY WITH ENDOBRONCHIAL NAVIGATION N/A 12/18/2018  ? Procedure: VIDEO BRONCHOSCOPY WITH ENDOBRONCHIAL NAVIGATION with Placement of Fiducial Markers x 3;  Surgeon: Grace Isaac, MD;  Location: [facility] OR;  Service: Thoracic;  Laterality: N/A;  ? VIDEO BRONCHOSCOPY WITH ENDOBRONCHIAL ULTRASOUND N/A 12/18/2018  ? Procedure: VIDEO BRONCHOSCOPY WITH ENDOBRONCHIAL ULTRASOUND;  Surgeon: Grace Isaac, MD;  Location: [place];  Service: Thoracic;  Laterality: N/A;  ? VIDEO BRONCHOSCOPY WITH ENDOBRONCHIAL ULTRASOUND Left 01/10/2022  ? Procedure: VIDEO BRONCHOSCOPY WITH ENDOBRONCHIAL ULTRASOUND;  Surgeon: Collene Gobble, MD;  Location: [facility] ENDOSCOPY;  Service: Cardiopulmonary;  Laterality: Left;  ? ? ?REVIEW OF SYSTEMS:  A comprehensive review of systems was negative except for: Ears, nose, mouth, throat, and face: positive for sore throat  ? ?PHYSICAL EXAMINATION: General appearance: alert, cooperative, fatigued, and no distress ?Head: Normocephalic, without obvious abnormality, atraumatic ?Neck: no adenopathy, no JVD, supple, symmetrical, trachea midline, and thyroid not enlarged, symmetric, no tenderness/mass/nodules ?Lymph nodes: Cervical, supraclavicular, and axillary nodes normal. ?Resp: clear to auscultation bilaterally ?Back: symmetric, no curvature. ROM normal. No CVA tenderness. ?Cardio: regular rate and rhythm, S1, S2 normal, no murmur, click, rub or gallop ?GI: soft, non-tender; bowel sounds normal; no masses,   no organomegaly ?Extremities: extremities normal, atraumatic, no cyanosis or edema ? ?ECOG PERFORMANCE STATUS: 1 - Symptomatic but completely ambulatory ? ?Blood pressure (!) 117/58, pulse 61, temperature 97.9 ?F (36.6 ?C), temperature source Tympanic, resp. rate 18, height 5\' 8"  (1.727 m), weight 179 lb 1 oz (81.2 kg), SpO2 98 %. ? ?LABORATORY DATA: ?Lab Results  ?Component Value Date  ? WBC 2.5 (L) 02/28/2022  ? HGB 11.7 (L) 02/28/2022  ? HCT 33.7 (L) 02/28/2022  ? MCV 93.9 02/28/2022  ? PLT 69 (L) 02/28/2022  ? ? ?  Chemistry   ?   ?Component Value Date/Time  ? NA 140 02/21/2022 1251  ? K 4.3 02/21/2022 1251  ? CL 108 02/21/2022 1251  ? CO2 26 02/21/2022 1251  ? BUN 16 02/21/2022 1251  ? CREATININE 1.59 (H) 02/21/2022 1251  ?    ?Component Value Date/Time  ? CALCIUM 8.8 (L) 02/21/2022 1251  ? ALKPHOS 53 02/21/2022 1251  ? AST 13 (L) 02/21/2022 1251  ? ALT 19 02/21/2022 1251  ? BILITOT 0.6 02/21/2022 1251  ?  ? ? ? ?RADIOGRAPHIC STUDIES: ?No results found. ? ?ASSESSMENT AND PLAN: This is a very pleasant 76 years  old white male diagnosed with recurrent non-small cell lung cancer that was initially diagnosed as stage IA (T1b, N0, M0) non-small cell lung cancer, adenocarcinoma presented with left upper lobe lung nodule in January 2020.  The patient has evidence for disease recurrence in AP window lymphadenopathy in December 2022.  He is status post left lingulectomy with lymph node dissection under the care of Dr. Servando Snare on January 01, 2019.  This was biopsy-proven to be recurrent adenocarcinoma of the lung. ?The patient is currently undergoing a course of concurrent chemoradiation with weekly carboplatin for AUC of 2 and paclitaxel 45 Mg/M2 status post 5 cycles.  ?The patient has been tolerating this treatment well with no concerning adverse effect except for the sore throat from the radiotherapy.  He is currently on Carafate.  He denied having any recent weight loss. ?He was supposed to start cycle #6 of his  treatment today but his platelets count are down to 69,000. ?I recommended for the patient to skip the cycle of the chemotherapy.  He will continue with radiation as planned. ?He will resume his chemotherapy ne

## 2022-03-01 ENCOUNTER — Ambulatory Visit
Admission: RE | Admit: 2022-03-01 | Discharge: 2022-03-01 | Disposition: A | Discharge status: Home / Self Care | Payer: PPO | Source: Ambulatory Visit | Attending: Radiation Oncology | Admitting: Radiation Oncology

## 2022-03-01 ENCOUNTER — Other Ambulatory Visit: Payer: Self-pay | Admitting: Radiation Oncology

## 2022-03-01 DIAGNOSIS — C3492 Malignant neoplasm of unspecified part of left bronchus or lung: Secondary | ICD-10-CM

## 2022-03-01 DIAGNOSIS — Z510 Encounter for antineoplastic radiation therapy: Secondary | ICD-10-CM | POA: Diagnosis not present

## 2022-03-01 MED ORDER — SONAFINE EX EMUL
1.0000 "application " | Freq: Once | CUTANEOUS | Status: AC
  Administered 2022-03-01: 1 via TOPICAL

## 2022-03-01 MED ORDER — OXYCODONE-ACETAMINOPHEN 5-325 MG PO TABS
1.0000 | ORAL_TABLET | Freq: Four times a day (QID) | ORAL | 0 refills | Status: DC | PRN
Start: 2022-03-01 — End: 2022-03-08

## 2022-03-02 ENCOUNTER — Other Ambulatory Visit: Payer: Self-pay

## 2022-03-02 ENCOUNTER — Ambulatory Visit
Admission: RE | Admit: 2022-03-02 | Discharge: 2022-03-02 | Disposition: A | Discharge status: Home / Self Care | Payer: PPO | Source: Ambulatory Visit | Attending: Radiation Oncology | Admitting: Radiation Oncology

## 2022-03-02 DIAGNOSIS — Z510 Encounter for antineoplastic radiation therapy: Secondary | ICD-10-CM | POA: Diagnosis not present

## 2022-03-03 ENCOUNTER — Ambulatory Visit
Admission: RE | Admit: 2022-03-03 | Discharge: 2022-03-03 | Disposition: A | Discharge status: Home / Self Care | Payer: PPO | Source: Ambulatory Visit | Attending: Radiation Oncology | Admitting: Radiation Oncology

## 2022-03-03 DIAGNOSIS — Z510 Encounter for antineoplastic radiation therapy: Secondary | ICD-10-CM | POA: Diagnosis not present

## 2022-03-04 ENCOUNTER — Ambulatory Visit
Admission: RE | Admit: 2022-03-04 | Discharge: 2022-03-04 | Disposition: A | Discharge status: Home / Self Care | Payer: PPO | Source: Ambulatory Visit | Attending: Radiation Oncology | Admitting: Radiation Oncology

## 2022-03-04 ENCOUNTER — Other Ambulatory Visit: Payer: Self-pay

## 2022-03-04 DIAGNOSIS — Z510 Encounter for antineoplastic radiation therapy: Secondary | ICD-10-CM | POA: Diagnosis not present

## 2022-03-07 ENCOUNTER — Ambulatory Visit
Admission: RE | Admit: 2022-03-07 | Discharge: 2022-03-07 | Disposition: A | Discharge status: Home / Self Care | Payer: PPO | Source: Ambulatory Visit | Attending: Radiation Oncology | Admitting: Radiation Oncology

## 2022-03-07 ENCOUNTER — Other Ambulatory Visit: Payer: Self-pay

## 2022-03-07 DIAGNOSIS — C3492 Malignant neoplasm of unspecified part of left bronchus or lung: Secondary | ICD-10-CM

## 2022-03-07 DIAGNOSIS — Z5111 Encounter for antineoplastic chemotherapy: Secondary | ICD-10-CM | POA: Diagnosis present

## 2022-03-07 DIAGNOSIS — Z510 Encounter for antineoplastic radiation therapy: Secondary | ICD-10-CM | POA: Diagnosis present

## 2022-03-08 ENCOUNTER — Ambulatory Visit
Admission: RE | Admit: 2022-03-08 | Discharge: 2022-03-08 | Disposition: A | Discharge status: Home / Self Care | Payer: PPO | Source: Ambulatory Visit | Attending: Radiation Oncology | Admitting: Radiation Oncology

## 2022-03-08 ENCOUNTER — Inpatient Hospital Stay: Payer: PPO

## 2022-03-08 ENCOUNTER — Other Ambulatory Visit: Payer: Self-pay | Admitting: Radiation Oncology

## 2022-03-08 VITALS — BP 129/63 | HR 53 | Temp 98.2°F | Resp 18 | Wt 180.0 lb

## 2022-03-08 DIAGNOSIS — C3492 Malignant neoplasm of unspecified part of left bronchus or lung: Secondary | ICD-10-CM

## 2022-03-08 DIAGNOSIS — Z5111 Encounter for antineoplastic chemotherapy: Secondary | ICD-10-CM | POA: Insufficient documentation

## 2022-03-08 DIAGNOSIS — C3412 Malignant neoplasm of upper lobe, left bronchus or lung: Secondary | ICD-10-CM | POA: Insufficient documentation

## 2022-03-08 DIAGNOSIS — R1310 Dysphagia, unspecified: Secondary | ICD-10-CM | POA: Insufficient documentation

## 2022-03-08 DIAGNOSIS — Z923 Personal history of irradiation: Secondary | ICD-10-CM | POA: Insufficient documentation

## 2022-03-08 DIAGNOSIS — Z79899 Other long term (current) drug therapy: Secondary | ICD-10-CM | POA: Insufficient documentation

## 2022-03-08 DIAGNOSIS — R5383 Other fatigue: Secondary | ICD-10-CM | POA: Insufficient documentation

## 2022-03-08 DIAGNOSIS — J449 Chronic obstructive pulmonary disease, unspecified: Secondary | ICD-10-CM | POA: Insufficient documentation

## 2022-03-08 LAB — CBC WITH DIFFERENTIAL (CANCER CENTER ONLY)
Abs Immature Granulocytes: 0.01 10*3/uL (ref 0.00–0.07)
Basophils Absolute: 0 10*3/uL (ref 0.0–0.1)
Basophils Relative: 0 %
Eosinophils Absolute: 0 10*3/uL (ref 0.0–0.5)
Eosinophils Relative: 1 %
HCT: 32.2 % — ABNORMAL LOW (ref 39.0–52.0)
Hemoglobin: 11.4 g/dL — ABNORMAL LOW (ref 13.0–17.0)
Immature Granulocytes: 0 %
Lymphocytes Relative: 23 %
Lymphs Abs: 0.6 10*3/uL — ABNORMAL LOW (ref 0.7–4.0)
MCH: 33.3 pg (ref 26.0–34.0)
MCHC: 35.4 g/dL (ref 30.0–36.0)
MCV: 94.2 fL (ref 80.0–100.0)
Monocytes Absolute: 0.6 10*3/uL (ref 0.1–1.0)
Monocytes Relative: 23 %
Neutro Abs: 1.5 10*3/uL — ABNORMAL LOW (ref 1.7–7.7)
Neutrophils Relative %: 53 %
Platelet Count: 113 10*3/uL — ABNORMAL LOW (ref 150–400)
RBC: 3.42 MIL/uL — ABNORMAL LOW (ref 4.22–5.81)
RDW: 15.1 % (ref 11.5–15.5)
WBC Count: 2.8 10*3/uL — ABNORMAL LOW (ref 4.0–10.5)
nRBC: 0 % (ref 0.0–0.2)

## 2022-03-08 LAB — CMP (CANCER CENTER ONLY)
ALT: 16 U/L (ref 0–44)
AST: 14 U/L — ABNORMAL LOW (ref 15–41)
Albumin: 3.9 g/dL (ref 3.5–5.0)
Alkaline Phosphatase: 55 U/L (ref 38–126)
Anion gap: 6 (ref 5–15)
BUN: 22 mg/dL (ref 8–23)
CO2: 27 mmol/L (ref 22–32)
Calcium: 9.1 mg/dL (ref 8.9–10.3)
Chloride: 108 mmol/L (ref 98–111)
Creatinine: 1.74 mg/dL — ABNORMAL HIGH (ref 0.61–1.24)
GFR, Estimated: 40 mL/min — ABNORMAL LOW (ref >60)
Glucose, Bld: 95 mg/dL (ref 70–99)
Potassium: 4.1 mmol/L (ref 3.5–5.1)
Sodium: 141 mmol/L (ref 135–145)
Total Bilirubin: 0.4 mg/dL (ref 0.3–1.2)
Total Protein: 6.7 g/dL (ref 6.5–8.1)

## 2022-03-08 MED ORDER — OXYCODONE-ACETAMINOPHEN 5-325 MG PO TABS
1.0000 | ORAL_TABLET | Freq: Four times a day (QID) | ORAL | 0 refills | Status: DC | PRN
Start: 2022-03-08 — End: 2022-03-15

## 2022-03-08 MED ORDER — SODIUM CHLORIDE 0.9 % IV SOLN: Freq: Once | INTRAVENOUS | Status: AC

## 2022-03-08 MED ORDER — SODIUM CHLORIDE 0.9 % IV SOLN
132.0000 mg | Freq: Once | INTRAVENOUS | Status: AC
  Administered 2022-03-08: 130 mg via INTRAVENOUS
  Filled 2022-03-08: qty 13

## 2022-03-08 MED ORDER — FAMOTIDINE IN NACL 20-0.9 MG/50ML-% IV SOLN
20.0000 mg | Freq: Once | INTRAVENOUS | Status: AC
  Administered 2022-03-08: 20 mg via INTRAVENOUS
  Filled 2022-03-08: qty 50

## 2022-03-08 MED ORDER — SODIUM CHLORIDE 0.9 % IV SOLN
10.0000 mg | Freq: Once | INTRAVENOUS | Status: AC
  Administered 2022-03-08: 10 mg via INTRAVENOUS
  Filled 2022-03-08: qty 1
  Filled 2022-03-08: qty 10

## 2022-03-08 MED ORDER — PALONOSETRON HCL INJECTION 0.25 MG/5ML
0.2500 mg | Freq: Once | INTRAVENOUS | Status: AC
  Administered 2022-03-08: 0.25 mg via INTRAVENOUS
  Filled 2022-03-08: qty 5

## 2022-03-08 MED ORDER — SODIUM CHLORIDE 0.9 % IV SOLN
45.0000 mg/m2 | Freq: Once | INTRAVENOUS | Status: AC
  Administered 2022-03-08: 90 mg via INTRAVENOUS
  Filled 2022-03-08: qty 15

## 2022-03-08 MED ORDER — DIPHENHYDRAMINE HCL 50 MG/ML IJ SOLN
50.0000 mg | Freq: Once | INTRAMUSCULAR | Status: AC
  Administered 2022-03-08: 50 mg via INTRAVENOUS
  Filled 2022-03-08: qty 1

## 2022-03-08 NOTE — Patient Instructions (Signed)
[facility]  Discharge Instructions: Thank you for choosing [facility] to provide your oncology and hematology care.   If you have a lab appointment with the Cancer Center, please go directly to the Cancer Center and check in at the registration area.   Wear comfortable clothing and clothing appropriate for easy access to any Portacath or PICC line.   We strive to give you quality time with your provider. You may need to reschedule your appointment if you arrive late (15 or more minutes).  Arriving late affects you and other patients whose appointments are after yours.  Also, if you miss three or more appointments without notifying the office, you may be dismissed from the clinic at the provider's discretion.      For prescription refill requests, have your pharmacy contact our office and allow 72 hours for refills to be completed.    Today you received the following chemotherapy and/or immunotherapy agents: Taxol & Carboplatin   To help prevent nausea and vomiting after your treatment, we encourage you to take your nausea medication as directed.  BELOW ARE SYMPTOMS THAT SHOULD BE REPORTED IMMEDIATELY: *FEVER GREATER THAN 100.4 F (38 C) OR HIGHER *CHILLS OR SWEATING *NAUSEA AND VOMITING THAT IS NOT CONTROLLED WITH YOUR NAUSEA MEDICATION *UNUSUAL SHORTNESS OF BREATH *UNUSUAL BRUISING OR BLEEDING *URINARY PROBLEMS (pain or burning when urinating, or frequent urination) *BOWEL PROBLEMS (unusual diarrhea, constipation, pain near the anus) TENDERNESS IN MOUTH AND THROAT WITH OR WITHOUT PRESENCE OF ULCERS (sore throat, sores in mouth, or a toothache) UNUSUAL RASH, SWELLING OR PAIN  UNUSUAL VAGINAL DISCHARGE OR ITCHING   Items with * indicate a potential emergency and should be followed up as soon as possible or go to the Emergency Department if any problems should occur.  Please show the CHEMOTHERAPY ALERT CARD or IMMUNOTHERAPY ALERT CARD at  check-in to the Emergency Department and triage nurse.  Should you have questions after your visit or need to cancel or reschedule your appointment, please contact [facility]  Dept: [phone]  and follow the prompts.  Office hours are 8:00 a.m. to 4:30 p.m. Monday - Friday. Please note that voicemails left after 4:00 p.m. may not be returned until the following business day.  We are closed weekends and major holidays. You have access to a nurse at all times for urgent questions. Please call the main number to the clinic Dept: [phone] and follow the prompts.   For any non-urgent questions, you may also contact your provider using MyChart. We now offer e-Visits for anyone 18 and older to request care online for non-urgent symptoms. For details visit [email or web address].   Also download the MyChart app! Go to the app store, search "MyChart", open the app, select , and log in with your MyChart username and password.  Due to Covid, a mask is required upon entering the hospital/clinic. If you do not have a mask, one will be given to you upon arrival. For doctor visits, patients may have 1 support person aged 18 or older with them. For treatment visits, patients cannot have anyone with them due to current Covid guidelines and our immunocompromised population.   

## 2022-03-08 NOTE — Progress Notes (Signed)
Ok to treat with sCr of 1.74 today per Dr. Julien Nordmann  ?

## 2022-03-09 ENCOUNTER — Ambulatory Visit
Admission: RE | Admit: 2022-03-09 | Discharge: 2022-03-09 | Disposition: A | Discharge status: Home / Self Care | Payer: PPO | Source: Ambulatory Visit | Attending: Radiation Oncology | Admitting: Radiation Oncology

## 2022-03-09 ENCOUNTER — Other Ambulatory Visit: Payer: Self-pay

## 2022-03-09 DIAGNOSIS — C3492 Malignant neoplasm of unspecified part of left bronchus or lung: Secondary | ICD-10-CM | POA: Diagnosis not present

## 2022-03-09 DIAGNOSIS — Z5111 Encounter for antineoplastic chemotherapy: Secondary | ICD-10-CM | POA: Diagnosis not present

## 2022-03-10 ENCOUNTER — Ambulatory Visit
Admission: RE | Admit: 2022-03-10 | Discharge: 2022-03-10 | Disposition: A | Discharge status: Home / Self Care | Payer: PPO | Source: Ambulatory Visit | Attending: Radiation Oncology | Admitting: Radiation Oncology

## 2022-03-10 ENCOUNTER — Telehealth: Payer: Self-pay | Admitting: Medical Oncology

## 2022-03-10 DIAGNOSIS — C3492 Malignant neoplasm of unspecified part of left bronchus or lung: Secondary | ICD-10-CM | POA: Diagnosis not present

## 2022-03-10 DIAGNOSIS — Z5111 Encounter for antineoplastic chemotherapy: Secondary | ICD-10-CM | POA: Diagnosis not present

## 2022-03-10 NOTE — Telephone Encounter (Signed)
Pt at [facility] and wants pneumovax. ? ?Pharmacy asking if it is ok to give it to him while he is undergoing chemotherapy?  ? ?The pharmacist said he is due.  ?

## 2022-03-11 ENCOUNTER — Telehealth: Payer: Self-pay | Admitting: Medical Oncology

## 2022-03-11 ENCOUNTER — Other Ambulatory Visit: Payer: Self-pay | Admitting: Physician Assistant

## 2022-03-11 ENCOUNTER — Ambulatory Visit
Admission: RE | Admit: 2022-03-11 | Discharge: 2022-03-11 | Disposition: A | Discharge status: Home / Self Care | Payer: PPO | Source: Ambulatory Visit | Attending: Radiation Oncology | Admitting: Radiation Oncology

## 2022-03-11 ENCOUNTER — Encounter: Payer: Self-pay | Admitting: Internal Medicine

## 2022-03-11 ENCOUNTER — Other Ambulatory Visit: Payer: Self-pay

## 2022-03-11 DIAGNOSIS — C3492 Malignant neoplasm of unspecified part of left bronchus or lung: Secondary | ICD-10-CM

## 2022-03-11 DIAGNOSIS — Z5111 Encounter for antineoplastic chemotherapy: Secondary | ICD-10-CM | POA: Diagnosis not present

## 2022-03-11 NOTE — Telephone Encounter (Signed)
Per Dr. Julien Nordmann I told pharmacy staff that it is ok for pt to get a pneumovax. ?

## 2022-03-14 ENCOUNTER — Encounter: Payer: Self-pay | Admitting: *Deleted

## 2022-03-14 ENCOUNTER — Ambulatory Visit
Admission: RE | Admit: 2022-03-14 | Discharge: 2022-03-14 | Disposition: A | Discharge status: Home / Self Care | Payer: PPO | Source: Ambulatory Visit | Attending: Radiation Oncology | Admitting: Radiation Oncology

## 2022-03-14 ENCOUNTER — Inpatient Hospital Stay: Payer: PPO | Admitting: Internal Medicine

## 2022-03-14 ENCOUNTER — Other Ambulatory Visit: Payer: Self-pay

## 2022-03-14 ENCOUNTER — Inpatient Hospital Stay: Payer: PPO

## 2022-03-14 VITALS — BP 121/66 | HR 61 | Temp 97.6°F | Resp 18 | Ht 68.0 in | Wt 177.6 lb

## 2022-03-14 DIAGNOSIS — C3492 Malignant neoplasm of unspecified part of left bronchus or lung: Secondary | ICD-10-CM

## 2022-03-14 DIAGNOSIS — Z5111 Encounter for antineoplastic chemotherapy: Secondary | ICD-10-CM

## 2022-03-14 DIAGNOSIS — C3490 Malignant neoplasm of unspecified part of unspecified bronchus or lung: Secondary | ICD-10-CM

## 2022-03-14 LAB — CMP (CANCER CENTER ONLY)
ALT: 18 U/L (ref 0–44)
AST: 14 U/L — ABNORMAL LOW (ref 15–41)
Albumin: 4 g/dL (ref 3.5–5.0)
Alkaline Phosphatase: 55 U/L (ref 38–126)
Anion gap: 5 (ref 5–15)
BUN: 26 mg/dL — ABNORMAL HIGH (ref 8–23)
CO2: 27 mmol/L (ref 22–32)
Calcium: 9 mg/dL (ref 8.9–10.3)
Chloride: 106 mmol/L (ref 98–111)
Creatinine: 1.64 mg/dL — ABNORMAL HIGH (ref 0.61–1.24)
GFR, Estimated: 43 mL/min — ABNORMAL LOW (ref >60)
Glucose, Bld: 98 mg/dL (ref 70–99)
Potassium: 4.2 mmol/L (ref 3.5–5.1)
Sodium: 138 mmol/L (ref 135–145)
Total Bilirubin: 0.9 mg/dL (ref 0.3–1.2)
Total Protein: 6.8 g/dL (ref 6.5–8.1)

## 2022-03-14 LAB — CBC WITH DIFFERENTIAL (CANCER CENTER ONLY)
Abs Immature Granulocytes: 0.01 10*3/uL (ref 0.00–0.07)
Basophils Absolute: 0 10*3/uL (ref 0.0–0.1)
Basophils Relative: 1 %
Eosinophils Absolute: 0 10*3/uL (ref 0.0–0.5)
Eosinophils Relative: 2 %
HCT: 32 % — ABNORMAL LOW (ref 39.0–52.0)
Hemoglobin: 11.5 g/dL — ABNORMAL LOW (ref 13.0–17.0)
Immature Granulocytes: 1 %
Lymphocytes Relative: 24 %
Lymphs Abs: 0.5 10*3/uL — ABNORMAL LOW (ref 0.7–4.0)
MCH: 33.6 pg (ref 26.0–34.0)
MCHC: 35.9 g/dL (ref 30.0–36.0)
MCV: 93.6 fL (ref 80.0–100.0)
Monocytes Absolute: 0.3 10*3/uL (ref 0.1–1.0)
Monocytes Relative: 15 %
Neutro Abs: 1.2 10*3/uL — ABNORMAL LOW (ref 1.7–7.7)
Neutrophils Relative %: 57 %
Platelet Count: 121 10*3/uL — ABNORMAL LOW (ref 150–400)
RBC: 3.42 MIL/uL — ABNORMAL LOW (ref 4.22–5.81)
RDW: 14.9 % (ref 11.5–15.5)
WBC Count: 2 10*3/uL — ABNORMAL LOW (ref 4.0–10.5)
nRBC: 0 % (ref 0.0–0.2)

## 2022-03-14 MED ORDER — SODIUM CHLORIDE 0.9 % IV SOLN
10.0000 mg | Freq: Once | INTRAVENOUS | Status: AC
  Administered 2022-03-14: 10 mg via INTRAVENOUS
  Filled 2022-03-14: qty 10

## 2022-03-14 MED ORDER — SODIUM CHLORIDE 0.9% FLUSH: 10.0000 mL | INTRAVENOUS | Status: DC | PRN

## 2022-03-14 MED ORDER — HEPARIN SOD (PORK) LOCK FLUSH 100 UNIT/ML IV SOLN: 500.0000 [IU] | Freq: Once | INTRAVENOUS | Status: DC | PRN

## 2022-03-14 MED ORDER — SODIUM CHLORIDE 0.9 % IV SOLN: Freq: Once | INTRAVENOUS | Status: AC

## 2022-03-14 MED ORDER — PALONOSETRON HCL INJECTION 0.25 MG/5ML
0.2500 mg | Freq: Once | INTRAVENOUS | Status: AC
  Administered 2022-03-14: 0.25 mg via INTRAVENOUS

## 2022-03-14 MED ORDER — DIPHENHYDRAMINE HCL 50 MG/ML IJ SOLN
50.0000 mg | Freq: Once | INTRAMUSCULAR | Status: AC
  Administered 2022-03-14: 50 mg via INTRAVENOUS

## 2022-03-14 MED ORDER — SODIUM CHLORIDE 0.9 % IV SOLN
132.0000 mg | Freq: Once | INTRAVENOUS | Status: AC
  Administered 2022-03-14: 130 mg via INTRAVENOUS
  Filled 2022-03-14: qty 13

## 2022-03-14 MED ORDER — FAMOTIDINE IN NACL 20-0.9 MG/50ML-% IV SOLN
20.0000 mg | Freq: Once | INTRAVENOUS | Status: AC
  Administered 2022-03-14: 20 mg via INTRAVENOUS

## 2022-03-14 MED ORDER — SODIUM CHLORIDE 0.9 % IV SOLN
45.0000 mg/m2 | Freq: Once | INTRAVENOUS | Status: AC
  Administered 2022-03-14: 90 mg via INTRAVENOUS
  Filled 2022-03-14: qty 15

## 2022-03-14 NOTE — Progress Notes (Signed)
Oncology Nurse Navigator Documentation ? ? ?  03/14/2022  ? 12:00 PM 01/19/2022  ?  4:00 PM 01/06/2022  ?  9:00 AM 01/05/2022  ?  1:00 PM 01/03/2022  ? 12:00 PM 12/28/2021  ?  8:00 AM 12/08/2021  ?  8:00 AM  ?Oncology Nurse Navigator Flowsheets  ?Abnormal Finding Date 11/19/2021        ?Confirmed Diagnosis Date 01/10/2022        ?Diagnosis Status Confirmed Diagnosis Complete     Additional Work Up   ?Planned Course of Treatment Chemo/Radiation Concurrent        ?Phase of Treatment Radiation        ?Chemotherapy Actual Start Date: 01/24/2022        ?Radiation Actual Start Date: 02/07/2022        ?Radiation Actual End Date: 03/18/2022        ?Navigator Follow Up Date: 04/04/2022 01/24/2022   01/13/2022 01/03/2022 12/10/2021  ?Navigator Follow Up Reason: Follow-up Appointment Pathology   Follow-up After Biopsy Appointment Review Appointment Review  ?Navigator Location [facility]  ?Navigator Encounter Type Appt/Treatment Plan Review Pathology Review Telephone Telephone Telephone Clinic/MDC;Follow-up Appt Clinic/MDC  ?Telephone   Incoming Call [employer] Call    ?Patient Visit Type  Other   Other MedOnc MedOnc  ?Treatment Phase Final Chemo TX;Treatment Other   Abnormal Scans Abnormal Scans Abnormal Scans  ?Barriers/Navigation Needs Coordination of Care/I followed up on Jack Taylor plan of care. He received his last chemo treatment today and will finish radiation this week.  Dr. Julien Nordmann will have a follow up with him in 3 weeks according to his note.  Coordination of Care Coordination of Care;Education Coordination of Care;Education Coordination of Care;Education Education;Coordination of Care Education;Coordination of Care  ?Education   Other Other Other Other Other  ?Interventions Coordination of Care Coordination of Care Coordination of Care;Psycho-Social Support;Education Coordination of Care;Education Coordination of  Care;Education Coordination of Care;Education;Psycho-Social Support Coordination of Care;Education;Psycho-Social Support  ?Acuity Level 2-Minimal Needs (1-2 Barriers Identified) Level 2-Minimal Needs (1-2 Barriers Identified) Level 2-Minimal Needs (1-2 Barriers Identified) Level 2-Minimal Needs (1-2 Barriers Identified) Level 2-Minimal Needs (1-2 Barriers Identified) Level 3-Moderate Needs (3-4 Barriers Identified) Level 3-Moderate Needs (3-4 Barriers Identified)  ?Coordination of Care Other Pathology Appts Other Other Other Other  ?Education Method   Verbal Verbal Verbal Verbal Verbal  ?Time Spent with Patient 30 30 30 30 30 30  45  ?  ?

## 2022-03-14 NOTE — Progress Notes (Signed)
Per Dr. Julien Nordmann, ok for treatment today with elevated serum creatine 1.64 mg/dL. ?

## 2022-03-14 NOTE — Progress Notes (Signed)
?    Jack Taylor ?Telephone:([phone]   [phone] ? ?OFFICE PROGRESS NOTE ? ?[name], MD ?[address] [place] [address] ? ?DIAGNOSIS: Recurrent non-small cell lung cancer initially diagnosed as stage IA (T1b, N0, M0) non-small cell lung cancer, adenocarcinoma presented with left upper lobe lung nodule diagnosed in January 2020.  The patient has evidence for disease recurrence in AP window lymphadenopathy in December 2022. ?  ?There was insufficient material for the molecular studies by [facility] medicine. ?  ?PRIOR THERAPY: Status post left lingulectomy with lymph node dissection under the care of Dr. Servando Snare on January 01, 2019 ?  ?CURRENT THERAPY: Concurrent chemoradiation with carboplatin for an AUC of 2 and paclitaxel 45 mg per metered squared weekly.  First dose on 01/24/2022.  Status post 6 cycles.  ? ?INTERVAL HISTORY: ?Jack Taylor 76 y.o. male returns to the clinic today for follow-up visit accompanied by his wife.  The patient is feeling fine today with no concerning complaints except for the burning sensation in his the chest as well as the odynophagia especially when he drinks carbonated drinks.  He denied having any chest pain, shortness of breath but has mild cough with no hemoptysis.  He denied having any fever or chills.  He has no nausea, vomiting, diarrhea or constipation.  He has no headache or visual changes.  He lost few pounds since his last visit.  The patient is here today for evaluation before the last cycle of his treatment. ? ?MEDICAL HISTORY: ?Past Medical History:  ?Diagnosis Date  ? Adenomatous colon polyp   ? Anxiety   ? Blood transfusion without reported diagnosis   ? Cancer [facility])   ? Lung; 11/2018  ? CKD (chronic kidney disease)   ? stage III 11/09/18  ? COPD (chronic obstructive pulmonary disease) ([place])   ? Gastric ulcer   ? GERD (gastroesophageal reflux disease)   ? High cholesterol   ? Hypertension   ? Pre-diabetes   ? A1c 6.2%  11/09/18  ? ? ?ALLERGIES:  has No Known Allergies. ? ?MEDICATIONS:  ?Current Outpatient Medications  ?Medication Sig Dispense Refill  ? albuterol (PROVENTIL HFA;VENTOLIN HFA) 108 (90 Base) MCG/ACT inhaler Inhale 2 puffs into the lungs every 6 (six) hours as needed for wheezing or shortness of breath. Reported on 06/02/2016 (Patient not taking: Reported on 02/14/2022)    ? amLODipine (NORVASC) 10 MG tablet Take 10 mg by mouth daily.     ? ANORO ELLIPTA 62.5-25 MCG/ACT AEPB Inhale 1 puff into the lungs daily.    ? esomeprazole (NEXIUM) 40 MG capsule Take 40 mg by mouth daily.     ? losartan (COZAAR) 50 MG tablet Take 50 mg by mouth daily.     ? oxyCODONE-acetaminophen (PERCOCET/ROXICET) 5-325 MG tablet Take 1 tablet by mouth every 6 (six) hours as needed for severe pain. 30 tablet 0  ? prochlorperazine (COMPAZINE) 10 MG tablet Take 1 tablet (10 mg total) by mouth every 6 (six) hours as needed. (Patient not taking: Reported on 02/28/2022) 30 tablet 2  ? rosuvastatin (CRESTOR) 10 MG tablet Take 10 mg by mouth daily.    ? sucralfate (CARAFATE) 1 g tablet Take 1 tablet (1 g total) by mouth 4 (four) times daily -  with meals and at bedtime. Crush and dissolve in 10 mL of warm water prior to swallowing 120 tablet 1  ? traZODone (DESYREL) 50 MG tablet Take 50 mg by mouth at bedtime.    ? ?  No current facility-administered medications for this visit.  ? ? ?SURGICAL HISTORY:  ?Past Surgical History:  ?Procedure Laterality Date  ? BRONCHIAL NEEDLE ASPIRATION BIOPSY  01/10/2022  ? Procedure: BRONCHIAL NEEDLE ASPIRATION BIOPSIES;  Surgeon: Collene Gobble, MD;  Location: [facility] ENDOSCOPY;  Service: Cardiopulmonary;;  ? COLONOSCOPY  2008  ? ESOPHAGOGASTRODUODENOSCOPY  11/23/2011  ? Procedure: ESOPHAGOGASTRODUODENOSCOPY (EGD);  Surgeon: Owens Loffler, MD;  Location: [facility] ENDOSCOPY;  Service: Endoscopy;  Laterality: N/A;  ? INTERCOSTAL NERVE BLOCK Left 01/01/2019  ? Procedure: INTERCOSTAL NERVE BLOCK;  Surgeon: Grace Isaac, MD;  Location:  [place];  Service: Thoracic;  Laterality: Left;  ? LUNG BIOPSY  12/18/2018  ? Procedure: LUNG BIOPSY - of Left Upper Lobe Biopsy of #7 Node Biopsy of #10L Node;  Surgeon: Grace Isaac, MD;  Location: [place];  Service: Thoracic;;  ? LYMPH NODE DISSECTION Left 01/01/2019  ? Procedure: LYMPH NODE DISSECTION;  Surgeon: Grace Isaac, MD;  Location: [place];  Service: Thoracic;  Laterality: Left;  ? UPPER GASTROINTESTINAL ENDOSCOPY  11/23/11  ? VIDEO ASSISTED THORACOSCOPY (VATS)/WEDGE RESECTION Left 01/01/2019  ? Procedure: VIDEO ASSISTED THORACOSCOPY (VATS)/ LEFT LINGULECTOMY;  Surgeon: Grace Isaac, MD;  Location: [place];  Service: Thoracic;  Laterality: Left;  ? VIDEO BRONCHOSCOPY WITH ENDOBRONCHIAL NAVIGATION N/A 12/18/2018  ? Procedure: VIDEO BRONCHOSCOPY WITH ENDOBRONCHIAL NAVIGATION with Placement of Fiducial Markers x 3;  Surgeon: Grace Isaac, MD;  Location: [facility] OR;  Service: Thoracic;  Laterality: N/A;  ? VIDEO BRONCHOSCOPY WITH ENDOBRONCHIAL ULTRASOUND N/A 12/18/2018  ? Procedure: VIDEO BRONCHOSCOPY WITH ENDOBRONCHIAL ULTRASOUND;  Surgeon: Grace Isaac, MD;  Location: [place];  Service: Thoracic;  Laterality: N/A;  ? VIDEO BRONCHOSCOPY WITH ENDOBRONCHIAL ULTRASOUND Left 01/10/2022  ? Procedure: VIDEO BRONCHOSCOPY WITH ENDOBRONCHIAL ULTRASOUND;  Surgeon: Collene Gobble, MD;  Location: [facility] ENDOSCOPY;  Service: Cardiopulmonary;  Laterality: Left;  ? ? ?REVIEW OF SYSTEMS:  A comprehensive review of systems was negative except for: Constitutional: positive for fatigue ?Gastrointestinal: positive for odynophagia  ? ?PHYSICAL EXAMINATION: General appearance: alert, cooperative, fatigued, and no distress ?Head: Normocephalic, without obvious abnormality, atraumatic ?Neck: no adenopathy, no JVD, supple, symmetrical, trachea midline, and thyroid not enlarged, symmetric, no tenderness/mass/nodules ?Lymph nodes: Cervical, supraclavicular, and axillary nodes normal. ?Resp: clear to auscultation  bilaterally ?Back: symmetric, no curvature. ROM normal. No CVA tenderness. ?Cardio: regular rate and rhythm, S1, S2 normal, no murmur, click, rub or gallop ?GI: soft, non-tender; bowel sounds normal; no masses,  no organomegaly ?Extremities: extremities normal, atraumatic, no cyanosis or edema ? ?ECOG PERFORMANCE STATUS: 1 - Symptomatic but completely ambulatory ? ?Blood pressure 121/66, pulse 61, temperature 97.6 ?F (36.4 ?C), temperature source Tympanic, resp. rate 18, height 5\' 8"  (1.727 m), weight 177 lb 9.6 oz (80.6 kg), SpO2 98 %. ? ?LABORATORY DATA: ?Lab Results  ?Component Value Date  ? WBC 2.0 (L) 03/14/2022  ? HGB 11.5 (L) 03/14/2022  ? HCT 32.0 (L) 03/14/2022  ? MCV 93.6 03/14/2022  ? PLT 121 (L) 03/14/2022  ? ? ?  Chemistry   ?   ?Component Value Date/Time  ? NA 141 03/08/2022 0915  ? K 4.1 03/08/2022 0915  ? CL 108 03/08/2022 0915  ? CO2 27 03/08/2022 0915  ? BUN 22 03/08/2022 0915  ? CREATININE 1.74 (H) 03/08/2022 0915  ?    ?Component Value Date/Time  ? CALCIUM 9.1 03/08/2022 0915  ? ALKPHOS 55 03/08/2022 0915  ? AST 14 (L) 03/08/2022 0915  ? ALT 16 03/08/2022 0915  ?  BILITOT 0.4 03/08/2022 0915  ?  ? ? ? ?RADIOGRAPHIC STUDIES: ?No results found. ? ?ASSESSMENT AND PLAN: This is a very pleasant 76 years old white male diagnosed with recurrent non-small cell lung cancer that was initially diagnosed as stage IA (T1b, N0, M0) non-small cell lung cancer, adenocarcinoma presented with left upper lobe lung nodule in January 2020.  The patient has evidence for disease recurrence in AP window lymphadenopathy in December 2022.  He is status post left lingulectomy with lymph node dissection under the care of Dr. Servando Snare on January 01, 2019.  This was biopsy-proven to be recurrent adenocarcinoma of the lung. ?The patient is currently undergoing a course of concurrent chemoradiation with weekly carboplatin for AUC of 2 and paclitaxel 45 Mg/M2 status post 6 cycles.  ?The patient has been tolerating his treatment  well except for mild fatigue and odynophagia. ?I recommended for him to proceed with the last cycle of his treatment today as planned. ?I will see him back for follow-up visit in 3 weeks for evaluation with repeat C

## 2022-03-14 NOTE — Progress Notes (Signed)
OK to treat per Dr. Julien Nordmann with [place] of 1.2K/uL ?

## 2022-03-14 NOTE — Patient Instructions (Signed)
[facility]  Discharge Instructions: Thank you for choosing [facility] to provide your oncology and hematology care.   If you have a lab appointment with the Cancer Center, please go directly to the Cancer Center and check in at the registration area.   Wear comfortable clothing and clothing appropriate for easy access to any Portacath or PICC line.   We strive to give you quality time with your provider. You may need to reschedule your appointment if you arrive late (15 or more minutes).  Arriving late affects you and other patients whose appointments are after yours.  Also, if you miss three or more appointments without notifying the office, you may be dismissed from the clinic at the provider's discretion.      For prescription refill requests, have your pharmacy contact our office and allow 72 hours for refills to be completed.    Today you received the following chemotherapy and/or immunotherapy agents: Paclitaxel (Taxol) and Carboplatin.   To help prevent nausea and vomiting after your treatment, we encourage you to take your nausea medication as directed.  BELOW ARE SYMPTOMS THAT SHOULD BE REPORTED IMMEDIATELY: *FEVER GREATER THAN 100.4 F (38 C) OR HIGHER *CHILLS OR SWEATING *NAUSEA AND VOMITING THAT IS NOT CONTROLLED WITH YOUR NAUSEA MEDICATION *UNUSUAL SHORTNESS OF BREATH *UNUSUAL BRUISING OR BLEEDING *URINARY PROBLEMS (pain or burning when urinating, or frequent urination) *BOWEL PROBLEMS (unusual diarrhea, constipation, pain near the anus) TENDERNESS IN MOUTH AND THROAT WITH OR WITHOUT PRESENCE OF ULCERS (sore throat, sores in mouth, or a toothache) UNUSUAL RASH, SWELLING OR PAIN  UNUSUAL VAGINAL DISCHARGE OR ITCHING   Items with * indicate a potential emergency and should be followed up as soon as possible or go to the Emergency Department if any problems should occur.  Please show the CHEMOTHERAPY ALERT CARD or IMMUNOTHERAPY  ALERT CARD at check-in to the Emergency Department and triage nurse.  Should you have questions after your visit or need to cancel or reschedule your appointment, please contact [facility]  Dept: [phone]  and follow the prompts.  Office hours are 8:00 a.m. to 4:30 p.m. Monday - Friday. Please note that voicemails left after 4:00 p.m. may not be returned until the following business day.  We are closed weekends and major holidays. You have access to a nurse at all times for urgent questions. Please call the main number to the clinic Dept: [phone] and follow the prompts.   For any non-urgent questions, you may also contact your provider using MyChart. We now offer e-Visits for anyone 18 and older to request care online for non-urgent symptoms. For details visit [email or web address].   Also download the MyChart app! Go to the app store, search "MyChart", open the app, select [facility], and log in with your MyChart username and password.  Due to Covid, a mask is required upon entering the hospital/clinic. If you do not have a mask, one will be given to you upon arrival. For doctor visits, patients may have 1 support person aged 18 or older with them. For treatment visits, patients cannot have anyone with them due to current Covid guidelines and our immunocompromised population.   

## 2022-03-15 ENCOUNTER — Ambulatory Visit
Admission: RE | Admit: 2022-03-15 | Discharge: 2022-03-15 | Disposition: A | Discharge status: Home / Self Care | Payer: PPO | Source: Ambulatory Visit | Attending: Radiation Oncology | Admitting: Radiation Oncology

## 2022-03-15 ENCOUNTER — Other Ambulatory Visit: Payer: Self-pay | Admitting: Radiation Oncology

## 2022-03-15 DIAGNOSIS — Z5111 Encounter for antineoplastic chemotherapy: Secondary | ICD-10-CM | POA: Diagnosis not present

## 2022-03-15 DIAGNOSIS — C3492 Malignant neoplasm of unspecified part of left bronchus or lung: Secondary | ICD-10-CM

## 2022-03-15 MED ORDER — OXYCODONE-ACETAMINOPHEN 5-325 MG PO TABS: 1.0000 | ORAL_TABLET | Freq: Four times a day (QID) | ORAL | 0 refills | Status: DC | PRN

## 2022-03-15 MED ORDER — SONAFINE EX EMUL
1.0000 "application " | Freq: Once | CUTANEOUS | Status: AC
  Administered 2022-03-15: 1 via TOPICAL

## 2022-03-16 ENCOUNTER — Ambulatory Visit
Admission: RE | Admit: 2022-03-16 | Discharge: 2022-03-16 | Disposition: A | Discharge status: Home / Self Care | Payer: PPO | Source: Ambulatory Visit | Attending: Radiation Oncology | Admitting: Radiation Oncology

## 2022-03-16 ENCOUNTER — Other Ambulatory Visit: Payer: Self-pay

## 2022-03-16 DIAGNOSIS — C3492 Malignant neoplasm of unspecified part of left bronchus or lung: Secondary | ICD-10-CM | POA: Diagnosis not present

## 2022-03-16 DIAGNOSIS — Z5111 Encounter for antineoplastic chemotherapy: Secondary | ICD-10-CM | POA: Diagnosis not present

## 2022-03-17 ENCOUNTER — Ambulatory Visit
Admission: RE | Admit: 2022-03-17 | Discharge: 2022-03-17 | Disposition: A | Discharge status: Home / Self Care | Payer: PPO | Source: Ambulatory Visit | Attending: Radiation Oncology | Admitting: Radiation Oncology

## 2022-03-17 DIAGNOSIS — Z5111 Encounter for antineoplastic chemotherapy: Secondary | ICD-10-CM | POA: Diagnosis not present

## 2022-03-17 DIAGNOSIS — C3492 Malignant neoplasm of unspecified part of left bronchus or lung: Secondary | ICD-10-CM | POA: Diagnosis not present

## 2022-03-18 ENCOUNTER — Encounter: Payer: Self-pay | Admitting: Radiation Oncology

## 2022-03-18 ENCOUNTER — Ambulatory Visit
Admission: RE | Admit: 2022-03-18 | Discharge: 2022-03-18 | Disposition: A | Discharge status: Home / Self Care | Payer: PPO | Source: Ambulatory Visit | Attending: Radiation Oncology | Admitting: Radiation Oncology

## 2022-03-18 ENCOUNTER — Other Ambulatory Visit: Payer: Self-pay

## 2022-03-18 DIAGNOSIS — Z5111 Encounter for antineoplastic chemotherapy: Secondary | ICD-10-CM | POA: Diagnosis not present

## 2022-03-18 DIAGNOSIS — C3492 Malignant neoplasm of unspecified part of left bronchus or lung: Secondary | ICD-10-CM | POA: Diagnosis not present

## 2022-03-23 ENCOUNTER — Telehealth: Payer: Self-pay | Admitting: Internal Medicine

## 2022-03-23 NOTE — Telephone Encounter (Signed)
Scheduled per 04/10 los, patient has been called and notified. ?

## 2022-03-28 ENCOUNTER — Other Ambulatory Visit: Payer: PPO

## 2022-03-31 ENCOUNTER — Inpatient Hospital Stay: Payer: PPO

## 2022-03-31 ENCOUNTER — Ambulatory Visit (HOSPITAL_COMMUNITY)
Admission: RE | Admit: 2022-03-31 | Discharge: 2022-03-31 | Disposition: A | Discharge status: Home / Self Care | Payer: PPO | Source: Ambulatory Visit | Attending: Internal Medicine | Admitting: Internal Medicine

## 2022-03-31 ENCOUNTER — Other Ambulatory Visit: Payer: Self-pay

## 2022-03-31 DIAGNOSIS — Z5111 Encounter for antineoplastic chemotherapy: Secondary | ICD-10-CM | POA: Diagnosis not present

## 2022-03-31 DIAGNOSIS — C3492 Malignant neoplasm of unspecified part of left bronchus or lung: Secondary | ICD-10-CM | POA: Diagnosis not present

## 2022-03-31 DIAGNOSIS — C3490 Malignant neoplasm of unspecified part of unspecified bronchus or lung: Secondary | ICD-10-CM

## 2022-03-31 LAB — CBC WITH DIFFERENTIAL (CANCER CENTER ONLY)
Abs Immature Granulocytes: 0.02 10*3/uL (ref 0.00–0.07)
Basophils Absolute: 0 10*3/uL (ref 0.0–0.1)
Basophils Relative: 1 %
Eosinophils Absolute: 0.1 10*3/uL (ref 0.0–0.5)
Eosinophils Relative: 2 %
HCT: 31.1 % — ABNORMAL LOW (ref 39.0–52.0)
Hemoglobin: 10.7 g/dL — ABNORMAL LOW (ref 13.0–17.0)
Immature Granulocytes: 1 %
Lymphocytes Relative: 17 %
Lymphs Abs: 0.5 10*3/uL — ABNORMAL LOW (ref 0.7–4.0)
MCH: 33.6 pg (ref 26.0–34.0)
MCHC: 34.4 g/dL (ref 30.0–36.0)
MCV: 97.8 fL (ref 80.0–100.0)
Monocytes Absolute: 0.6 10*3/uL (ref 0.1–1.0)
Monocytes Relative: 19 %
Neutro Abs: 1.8 10*3/uL (ref 1.7–7.7)
Neutrophils Relative %: 60 %
Platelet Count: 116 10*3/uL — ABNORMAL LOW (ref 150–400)
RBC: 3.18 MIL/uL — ABNORMAL LOW (ref 4.22–5.81)
RDW: 15.5 % (ref 11.5–15.5)
WBC Count: 3 10*3/uL — ABNORMAL LOW (ref 4.0–10.5)
nRBC: 0 % (ref 0.0–0.2)

## 2022-03-31 LAB — CMP (CANCER CENTER ONLY)
ALT: 19 U/L (ref 0–44)
AST: 16 U/L (ref 15–41)
Albumin: 3.8 g/dL (ref 3.5–5.0)
Alkaline Phosphatase: 55 U/L (ref 38–126)
Anion gap: 6 (ref 5–15)
BUN: 18 mg/dL (ref 8–23)
CO2: 27 mmol/L (ref 22–32)
Calcium: 9.2 mg/dL (ref 8.9–10.3)
Chloride: 108 mmol/L (ref 98–111)
Creatinine: 1.86 mg/dL — ABNORMAL HIGH (ref 0.61–1.24)
GFR, Estimated: 37 mL/min — ABNORMAL LOW (ref >60)
Glucose, Bld: 126 mg/dL — ABNORMAL HIGH (ref 70–99)
Potassium: 4 mmol/L (ref 3.5–5.1)
Sodium: 141 mmol/L (ref 135–145)
Total Bilirubin: 0.4 mg/dL (ref 0.3–1.2)
Total Protein: 6.4 g/dL — ABNORMAL LOW (ref 6.5–8.1)

## 2022-04-04 ENCOUNTER — Encounter: Payer: Self-pay | Admitting: Internal Medicine

## 2022-04-04 ENCOUNTER — Inpatient Hospital Stay: Payer: PPO | Attending: Internal Medicine | Admitting: Internal Medicine

## 2022-04-04 ENCOUNTER — Other Ambulatory Visit: Payer: Self-pay

## 2022-04-04 ENCOUNTER — Other Ambulatory Visit: Payer: PPO

## 2022-04-04 VITALS — BP 112/65 | HR 64 | Temp 97.2°F | Resp 16 | Wt 171.1 lb

## 2022-04-04 DIAGNOSIS — I2510 Atherosclerotic heart disease of native coronary artery without angina pectoris: Secondary | ICD-10-CM | POA: Diagnosis not present

## 2022-04-04 DIAGNOSIS — Z5112 Encounter for antineoplastic immunotherapy: Secondary | ICD-10-CM | POA: Insufficient documentation

## 2022-04-04 DIAGNOSIS — R5383 Other fatigue: Secondary | ICD-10-CM | POA: Insufficient documentation

## 2022-04-04 DIAGNOSIS — I700 Atherosclerosis of aorta: Secondary | ICD-10-CM | POA: Diagnosis not present

## 2022-04-04 DIAGNOSIS — C3492 Malignant neoplasm of unspecified part of left bronchus or lung: Secondary | ICD-10-CM

## 2022-04-04 DIAGNOSIS — Z79899 Other long term (current) drug therapy: Secondary | ICD-10-CM | POA: Insufficient documentation

## 2022-04-04 DIAGNOSIS — R1310 Dysphagia, unspecified: Secondary | ICD-10-CM | POA: Diagnosis not present

## 2022-04-04 DIAGNOSIS — J449 Chronic obstructive pulmonary disease, unspecified: Secondary | ICD-10-CM | POA: Insufficient documentation

## 2022-04-04 DIAGNOSIS — C3412 Malignant neoplasm of upper lobe, left bronchus or lung: Secondary | ICD-10-CM | POA: Diagnosis present

## 2022-04-04 NOTE — Progress Notes (Signed)
?    [facility] ?[phone]   [phone] ? ?OFFICE PROGRESS NOTE ? ?[name], MD ?[address] [place] [address] ? ?DIAGNOSIS: Recurrent non-small cell lung cancer initially diagnosed as stage IA (T1b, N0, M0) non-small cell lung cancer, adenocarcinoma presented with left upper lobe lung nodule diagnosed in January 2020.  The patient has evidence for disease recurrence in AP window lymphadenopathy in December 2022. ?  ?There was insufficient material for the molecular studies by [facility]. ?  ?PRIOR THERAPY:  ?1) Status post left lingulectomy with lymph node dissection under the care of Dr. Servando Snare on January 01, 2019. ?2) Concurrent chemoradiation with carboplatin for an AUC of 2 and paclitaxel 45 mg per metered squared weekly.  First dose on 01/24/2022.  Status post 7 cycles. Last dose was given March 14, 2022 ?  ?CURRENT THERAPY: Consolidation treatment with immunotherapy with Imfinzi 1500 Mg IV every 4 weeks.  First dose Apr 12, 2022. ? ?INTERVAL HISTORY: ?Jack Taylor 76 y.o. male returns to the clinic today for follow-up visit accompanied by his wife.  The patient is feeling fine today with no concerning complaints except for the persistent odynophagia.  He is currently on Carafate.  The patient denied having any chest pain, shortness of breath, cough or hemoptysis.  He has no nausea, vomiting, diarrhea or constipation.  He has no headache or visual changes.  He has no recent weight loss or night sweats.  He is recovering fairly well from the course of concurrent chemoradiation except for the odynophagia.  He had repeat CT scan of the chest performed recently and he is here for evaluation and discussion of his scan results and treatment options. ? ?MEDICAL HISTORY: ?Past Medical History:  ?Diagnosis Date  ? Adenomatous colon polyp   ? Anxiety   ? Blood transfusion without reported diagnosis   ? Cancer [facility])   ? Lung; 11/2018  ? CKD (chronic  kidney disease)   ? stage III 11/09/18  ? COPD (chronic obstructive pulmonary disease) ([facility])   ? Gastric ulcer   ? GERD (gastroesophageal reflux disease)   ? High cholesterol   ? Hypertension   ? Pre-diabetes   ? A1c 6.2% 11/09/18  ? ? ?ALLERGIES:  has No Known Allergies. ? ?MEDICATIONS:  ?Current Outpatient Medications  ?Medication Sig Dispense Refill  ? albuterol (PROVENTIL HFA;VENTOLIN HFA) 108 (90 Base) MCG/ACT inhaler Inhale 2 puffs into the lungs every 6 (six) hours as needed for wheezing or shortness of breath. Reported on 06/02/2016 (Patient not taking: Reported on 02/14/2022)    ? amLODipine (NORVASC) 10 MG tablet Take 10 mg by mouth daily.     ? ANORO ELLIPTA 62.5-25 MCG/ACT AEPB Inhale 1 puff into the lungs daily.    ? esomeprazole (NEXIUM) 40 MG capsule Take 40 mg by mouth daily.     ? losartan (COZAAR) 50 MG tablet Take 50 mg by mouth daily.     ? oxyCODONE-acetaminophen (PERCOCET/ROXICET) 5-325 MG tablet Take 1 tablet by mouth every 6 (six) hours as needed for severe pain. 30 tablet 0  ? prochlorperazine (COMPAZINE) 10 MG tablet Take 1 tablet (10 mg total) by mouth every 6 (six) hours as needed. (Patient not taking: Reported on 02/28/2022) 30 tablet 2  ? rosuvastatin (CRESTOR) 10 MG tablet Take 10 mg by mouth daily.    ? sucralfate (CARAFATE) 1 g tablet Take 1 tablet (1 g total) by mouth 4 (four) times daily -  with  meals and at bedtime. Crush and dissolve in 10 mL of warm water prior to swallowing 120 tablet 1  ? traZODone (DESYREL) 50 MG tablet Take 50 mg by mouth at bedtime.    ? ?No current facility-administered medications for this visit.  ? ? ?SURGICAL HISTORY:  ?Past Surgical History:  ?Procedure Laterality Date  ? BRONCHIAL NEEDLE ASPIRATION BIOPSY  01/10/2022  ? Procedure: BRONCHIAL NEEDLE ASPIRATION BIOPSIES;  Surgeon: Collene Gobble, MD;  Location: [facility] ENDOSCOPY;  Service: Cardiopulmonary;;  ? COLONOSCOPY  2008  ? ESOPHAGOGASTRODUODENOSCOPY  11/23/2011  ? Procedure: ESOPHAGOGASTRODUODENOSCOPY  (EGD);  Surgeon: Owens Loffler, MD;  Location: [facility] ENDOSCOPY;  Service: Endoscopy;  Laterality: N/A;  ? INTERCOSTAL NERVE BLOCK Left 01/01/2019  ? Procedure: INTERCOSTAL NERVE BLOCK;  Surgeon: Grace Isaac, MD;  Location: [place];  Service: Thoracic;  Laterality: Left;  ? LUNG BIOPSY  12/18/2018  ? Procedure: LUNG BIOPSY - of Left Upper Lobe Biopsy of #7 Node Biopsy of #10L Node;  Surgeon: Grace Isaac, MD;  Location: [place];  Service: Thoracic;;  ? LYMPH NODE DISSECTION Left 01/01/2019  ? Procedure: LYMPH NODE DISSECTION;  Surgeon: Grace Isaac, MD;  Location: [place];  Service: Thoracic;  Laterality: Left;  ? UPPER GASTROINTESTINAL ENDOSCOPY  11/23/11  ? VIDEO ASSISTED THORACOSCOPY (VATS)/WEDGE RESECTION Left 01/01/2019  ? Procedure: VIDEO ASSISTED THORACOSCOPY (VATS)/ LEFT LINGULECTOMY;  Surgeon: Grace Isaac, MD;  Location: [place];  Service: Thoracic;  Laterality: Left;  ? VIDEO BRONCHOSCOPY WITH ENDOBRONCHIAL NAVIGATION N/A 12/18/2018  ? Procedure: VIDEO BRONCHOSCOPY WITH ENDOBRONCHIAL NAVIGATION with Placement of Fiducial Markers x 3;  Surgeon: Grace Isaac, MD;  Location: [facility] OR;  Service: Thoracic;  Laterality: N/A;  ? VIDEO BRONCHOSCOPY WITH ENDOBRONCHIAL ULTRASOUND N/A 12/18/2018  ? Procedure: VIDEO BRONCHOSCOPY WITH ENDOBRONCHIAL ULTRASOUND;  Surgeon: Grace Isaac, MD;  Location: [place];  Service: Thoracic;  Laterality: N/A;  ? VIDEO BRONCHOSCOPY WITH ENDOBRONCHIAL ULTRASOUND Left 01/10/2022  ? Procedure: VIDEO BRONCHOSCOPY WITH ENDOBRONCHIAL ULTRASOUND;  Surgeon: Collene Gobble, MD;  Location: [facility] ENDOSCOPY;  Service: Cardiopulmonary;  Laterality: Left;  ? ? ?REVIEW OF SYSTEMS:  Constitutional: positive for fatigue ?Eyes: negative ?Ears, nose, mouth, throat, and face: negative ?Respiratory: negative ?Cardiovascular: negative ?Gastrointestinal: positive for odynophagia ?Genitourinary:negative ?Integument/breast: negative ?Hematologic/lymphatic:  negative ?Musculoskeletal:negative ?Neurological: negative ?Behavioral/Psych: negative ?Endocrine: negative ?Allergic/Immunologic: negative  ? ?PHYSICAL EXAMINATION: General appearance: alert, cooperative, fatigued, and no distress ?Head: Normocephalic, without obvious abnormality, atraumatic ?Neck: no adenopathy, no JVD, supple, symmetrical, trachea midline, and thyroid not enlarged, symmetric, no tenderness/mass/nodules ?Lymph nodes: Cervical, supraclavicular, and axillary nodes normal. ?Resp: clear to auscultation bilaterally ?Back: symmetric, no curvature. ROM normal. No CVA tenderness. ?Cardio: regular rate and rhythm, S1, S2 normal, no murmur, click, rub or gallop ?GI: soft, non-tender; bowel sounds normal; no masses,  no organomegaly ?Extremities: extremities normal, atraumatic, no cyanosis or edema ?Neurologic: Alert and oriented X 3, normal strength and tone. Normal symmetric reflexes. Normal coordination and gait ? ?ECOG PERFORMANCE STATUS: 1 - Symptomatic but completely ambulatory ? ?Blood pressure 112/65, pulse 64, temperature (!) 97.2 ?F (36.2 ?C), temperature source Tympanic, resp. rate 16, weight 171 lb 2 oz (77.6 kg), SpO2 97 %. ? ?LABORATORY DATA: ?Lab Results  ?Component Value Date  ? WBC 3.0 (L) 03/31/2022  ? HGB 10.7 (L) 03/31/2022  ? HCT 31.1 (L) 03/31/2022  ? MCV 97.8 03/31/2022  ? PLT 116 (L) 03/31/2022  ? ? ?  Chemistry   ?   ?Component Value Date/Time  ? NA  141 03/31/2022 0747  ? K 4.0 03/31/2022 0747  ? CL 108 03/31/2022 0747  ? CO2 27 03/31/2022 0747  ? BUN 18 03/31/2022 0747  ? CREATININE 1.86 (H) 03/31/2022 0747  ?    ?Component Value Date/Time  ? CALCIUM 9.2 03/31/2022 0747  ? ALKPHOS 55 03/31/2022 0747  ? AST 16 03/31/2022 0747  ? ALT 19 03/31/2022 0747  ? BILITOT 0.4 03/31/2022 0747  ?  ? ? ? ?RADIOGRAPHIC STUDIES: ?CT Chest Wo Contrast ? ?Result Date: 03/31/2022 ?CLINICAL DATA:  Left lung cancer restaging, status post lingular resection, chemotherapy, and XRT * Tracking Code: BO *  EXAM: CT CHEST WITHOUT CONTRAST TECHNIQUE: Multidetector CT imaging of the chest was performed following the standard protocol without IV contrast. RADIATION DOSE REDUCTION: This exam was performed according to the departmental dose-optimization prog

## 2022-04-07 ENCOUNTER — Telehealth: Payer: Self-pay | Admitting: Internal Medicine

## 2022-04-07 NOTE — Telephone Encounter (Signed)
Scheduled per los, patient has been and voicemail was left.Marland Kitchen ?

## 2022-04-11 ENCOUNTER — Encounter: Payer: Self-pay | Admitting: Radiation Oncology

## 2022-04-11 ENCOUNTER — Other Ambulatory Visit: Payer: PPO

## 2022-04-12 ENCOUNTER — Other Ambulatory Visit: Payer: Self-pay

## 2022-04-12 ENCOUNTER — Inpatient Hospital Stay: Payer: PPO

## 2022-04-12 VITALS — BP 111/63 | HR 55 | Temp 98.0°F | Resp 17 | Wt 170.5 lb

## 2022-04-12 DIAGNOSIS — C3492 Malignant neoplasm of unspecified part of left bronchus or lung: Secondary | ICD-10-CM

## 2022-04-12 DIAGNOSIS — Z5112 Encounter for antineoplastic immunotherapy: Secondary | ICD-10-CM | POA: Diagnosis not present

## 2022-04-12 LAB — CBC WITH DIFFERENTIAL (CANCER CENTER ONLY)
Abs Immature Granulocytes: 0.01 10*3/uL (ref 0.00–0.07)
Basophils Absolute: 0 10*3/uL (ref 0.0–0.1)
Basophils Relative: 0 %
Eosinophils Absolute: 0.2 10*3/uL (ref 0.0–0.5)
Eosinophils Relative: 5 %
HCT: 31.3 % — ABNORMAL LOW (ref 39.0–52.0)
Hemoglobin: 11 g/dL — ABNORMAL LOW (ref 13.0–17.0)
Immature Granulocytes: 0 %
Lymphocytes Relative: 17 %
Lymphs Abs: 0.6 10*3/uL — ABNORMAL LOW (ref 0.7–4.0)
MCH: 34.4 pg — ABNORMAL HIGH (ref 26.0–34.0)
MCHC: 35.1 g/dL (ref 30.0–36.0)
MCV: 97.8 fL (ref 80.0–100.0)
Monocytes Absolute: 0.4 10*3/uL (ref 0.1–1.0)
Monocytes Relative: 12 %
Neutro Abs: 2.3 10*3/uL (ref 1.7–7.7)
Neutrophils Relative %: 66 %
Platelet Count: 77 10*3/uL — ABNORMAL LOW (ref 150–400)
RBC: 3.2 MIL/uL — ABNORMAL LOW (ref 4.22–5.81)
RDW: 15.2 % (ref 11.5–15.5)
WBC Count: 3.5 10*3/uL — ABNORMAL LOW (ref 4.0–10.5)
nRBC: 0 % (ref 0.0–0.2)

## 2022-04-12 LAB — TSH: TSH: 1.882 u[IU]/mL (ref 0.350–4.500)

## 2022-04-12 LAB — CMP (CANCER CENTER ONLY)
ALT: 20 U/L (ref 0–44)
AST: 16 U/L (ref 15–41)
Albumin: 3.7 g/dL (ref 3.5–5.0)
Alkaline Phosphatase: 57 U/L (ref 38–126)
Anion gap: 8 (ref 5–15)
BUN: 15 mg/dL (ref 8–23)
CO2: 23 mmol/L (ref 22–32)
Calcium: 8.9 mg/dL (ref 8.9–10.3)
Chloride: 108 mmol/L (ref 98–111)
Creatinine: 1.58 mg/dL — ABNORMAL HIGH (ref 0.61–1.24)
GFR, Estimated: 45 mL/min — ABNORMAL LOW (ref >60)
Glucose, Bld: 152 mg/dL — ABNORMAL HIGH (ref 70–99)
Potassium: 3.9 mmol/L (ref 3.5–5.1)
Sodium: 139 mmol/L (ref 135–145)
Total Bilirubin: 0.6 mg/dL (ref 0.3–1.2)
Total Protein: 6.7 g/dL (ref 6.5–8.1)

## 2022-04-12 MED ORDER — SODIUM CHLORIDE 0.9 % IV SOLN: Freq: Once | INTRAVENOUS | Status: AC

## 2022-04-12 MED ORDER — SODIUM CHLORIDE 0.9 % IV SOLN
1500.0000 mg | Freq: Once | INTRAVENOUS | Status: AC
  Administered 2022-04-12: 1500 mg via INTRAVENOUS
  Filled 2022-04-12: qty 30

## 2022-04-12 NOTE — Patient Instructions (Signed)
[facility]   Discharge Instructions: Thank you for choosing [facility] to provide your oncology and hematology care.   If you have a lab appointment with the Cancer Center, please go directly to the Cancer Center and check in at the registration area.   Wear comfortable clothing and clothing appropriate for easy access to any Portacath or PICC line.   We strive to give you quality time with your provider. You may need to reschedule your appointment if you arrive late (15 or more minutes).  Arriving late affects you and other patients whose appointments are after yours.  Also, if you miss three or more appointments without notifying the office, you may be dismissed from the clinic at the provider's discretion.      For prescription refill requests, have your pharmacy contact our office and allow 72 hours for refills to be completed.    Today you received the following chemotherapy and/or immunotherapy agents: Durvalumab (Imfinzi).      To help prevent nausea and vomiting after your treatment, we encourage you to take your nausea medication as directed.  BELOW ARE SYMPTOMS THAT SHOULD BE REPORTED IMMEDIATELY: *FEVER GREATER THAN 100.4 F (38 C) OR HIGHER *CHILLS OR SWEATING *NAUSEA AND VOMITING THAT IS NOT CONTROLLED WITH YOUR NAUSEA MEDICATION *UNUSUAL SHORTNESS OF BREATH *UNUSUAL BRUISING OR BLEEDING *URINARY PROBLEMS (pain or burning when urinating, or frequent urination) *BOWEL PROBLEMS (unusual diarrhea, constipation, pain near the anus) TENDERNESS IN MOUTH AND THROAT WITH OR WITHOUT PRESENCE OF ULCERS (sore throat, sores in mouth, or a toothache) UNUSUAL RASH, SWELLING OR PAIN  UNUSUAL VAGINAL DISCHARGE OR ITCHING   Items with * indicate a potential emergency and should be followed up as soon as possible or go to the Emergency Department if any problems should occur.  Please show the CHEMOTHERAPY ALERT CARD or IMMUNOTHERAPY ALERT CARD  at check-in to the Emergency Department and triage nurse.  Should you have questions after your visit or need to cancel or reschedule your appointment, please contact [facility]  Dept: [phone]  and follow the prompts.  Office hours are 8:00 a.m. to 4:30 p.m. Monday - Friday. Please note that voicemails left after 4:00 p.m. may not be returned until the following business day.  We are closed weekends and major holidays. You have access to a nurse at all times for urgent questions. Please call the main number to the clinic Dept: [phone] and follow the prompts.   For any non-urgent questions, you may also contact your provider using MyChart. We now offer e-Visits for anyone 18 and older to request care online for non-urgent symptoms. For details visit [email or web address].   Also download the MyChart app! Go to the app store, search "MyChart", open the app, select [facility], and log in with your MyChart username and password.  Due to Covid, a mask is required upon entering the hospital/clinic. If you do not have a mask, one will be given to you upon arrival. For doctor visits, patients may have 1 support person aged 18 or older with them. For treatment visits, patients cannot have anyone with them due to current Covid guidelines and our immunocompromised population.   

## 2022-04-12 NOTE — Progress Notes (Signed)
Per Dr Julien Nordmann, okay to proceed today with platelets of 77 K/uL. Okay to proceed with labs from 03/31/22 as the CMP from today has not resulted yet due to lab delays. Okay with creatinine of 1.86 mg/dL from 4/27.  ?

## 2022-04-13 ENCOUNTER — Telehealth: Payer: Self-pay

## 2022-04-13 NOTE — Telephone Encounter (Signed)
LM for patient that this nurse was calling to see how they were doing after their treatment. Please call back to Dr. Mohamed's nurse at [phone] if they have any questions or concerns regarding the treatment.  

## 2022-04-13 NOTE — Telephone Encounter (Signed)
-----   Message from Daphane Shepherd, RN sent at 04/12/2022  3:48 PM EDT ----- ?Regarding: First time imfinzi ?Pt of Dr Julien Nordmann, for time Imfinzi, he's been on other chemo in the past. Tolerated well, no complaints or signs of distress upon discharge.  ? ?

## 2022-04-17 NOTE — Progress Notes (Signed)
?Radiation Oncology         [phone]) [phone] ?________________________________ ? ?Name: Jack Taylor MRN: [id number]  ?Date: 04/18/2022  DOB: 1946/06/23 ? ?Follow-Up Visit Note ? ?CC: Eksir, Earnest Conroy, MD  Curt Bears, MD ? ?  ICD-10-CM   ?1. Adenocarcinoma of left lung, stage 3 (HCC)  C34.92   ?  ? ? ?Diagnosis: Recurrent adenocarcinoma of the lung ?  ?Recurrent non-small cell lung cancer initially diagnosed as stage IA (T1b, N0, M0) non-small cell lung cancer, adenocarcinoma presented with left upper lobe lung nodule diagnosed in January 2020.  The patient has evidence for disease recurrence in AP window lymphadenopathy in December 2022.  ? ?Interval Since Last Radiation: 1 month and 1 day  ? ?Intent: Curative ? ?Radiation Treatment Dates: 02/07/2022 through 03/18/2022 ?Site Technique Total Dose (Gy) Dose per Fx (Gy) Completed Fx Beam Energies  ?Lung, Left: Lung_L 3D 60/60 2 30/30 6X, 10X  ? ? ?Narrative:  The patient returns today for routine follow-up. The patient tolerated radiation therapy relatively well. During his final weekly treatment check on 03/15/22, the patient reported chest soreness which was helped with pain medication, fatigue, weakness, a rash on his back which resolved with sonafine, exertional dyspnea, dry cough, and pain with swallowing (given carafate and percocet for pain).    ? ?Since his initial consultation on 01/31/22, the patient completed chemotherapy consisting of carboplatin and paclitaxel on 03/14/22 under the care of Dr. Julien Nordmann. The patient in currently on consolidation immunotherapy with Imfinzi every 4 weeks, first dose on 04/12/22. During his most recent follow up visit with Dr. Julien Nordmann on 04/04/22, the patient reported issues with persistent odynophagia, and was noted to be still using carafate. Otherwise, the patient endorsed recovering well from his concurrent chemoradiation.           ? ?His most recent Chest CT on 03/31/22 showed a decrease in size of the previously  hypermetabolic AP window lymph node, consistent with a good treatment response. CT also showed a background of very fine centrilobular pulmonary nodules, most concentrated in the lung apices, as well as diffuse bilateral ?bronchial wall thickening, consistent with smoking-related respiratory bronchiolitis. Otherwise, no evidence of new metastatic disease in the chest was appreciated.                   ? ?Allergies:  has No Known Allergies. ? ?Meds: ?Current Outpatient Medications  ?Medication Sig Dispense Refill  ? albuterol (PROVENTIL HFA;VENTOLIN HFA) 108 (90 Base) MCG/ACT inhaler Inhale 2 puffs into the lungs every 6 (six) hours as needed for wheezing or shortness of breath. Reported on 06/02/2016 (Patient not taking: Reported on 02/14/2022)    ? amLODipine (NORVASC) 10 MG tablet Take 10 mg by mouth daily.     ? ANORO ELLIPTA 62.5-25 MCG/ACT AEPB Inhale 1 puff into the lungs daily.    ? esomeprazole (NEXIUM) 40 MG capsule Take 40 mg by mouth daily.     ? losartan (COZAAR) 50 MG tablet Take 50 mg by mouth daily.     ? oxyCODONE-acetaminophen (PERCOCET/ROXICET) 5-325 MG tablet Take 1 tablet by mouth every 6 (six) hours as needed for severe pain. 30 tablet 0  ? prochlorperazine (COMPAZINE) 10 MG tablet Take 1 tablet (10 mg total) by mouth every 6 (six) hours as needed. (Patient not taking: Reported on 02/28/2022) 30 tablet 2  ? rosuvastatin (CRESTOR) 10 MG tablet Take 10 mg by mouth daily.    ? sucralfate (CARAFATE) 1 g tablet Take 1 tablet (  1 g total) by mouth 4 (four) times daily -  with meals and at bedtime. Crush and dissolve in 10 mL of warm water prior to swallowing 120 tablet 1  ? traZODone (DESYREL) 50 MG tablet Take 50 mg by mouth at bedtime.    ? ?No current facility-administered medications for this encounter.  ? ? ?Physical Findings: ?The patient is in no acute distress. Patient is alert and oriented. ? height is 5\' 8"  (1.727 m) and weight is 172 lb 9.6 oz (78.3 kg). His temperature is 97.8 ?F (36.6 ?C).  His blood pressure is 108/56 (abnormal) and his pulse is 60. His respiration is 17 and oxygen saturation is 99%. .  No significant changes. Lungs are clear to auscultation bilaterally. Heart has regular rate and rhythm. No palpable cervical, supraclavicular, or axillary adenopathy. Abdomen soft, non-tender, normal bowel sounds.  ? ? ?Lab Findings: ?Lab Results  ?Component Value Date  ? WBC 3.5 (L) 04/12/2022  ? HGB 11.0 (L) 04/12/2022  ? HCT 31.3 (L) 04/12/2022  ? MCV 97.8 04/12/2022  ? PLT 77 (L) 04/12/2022  ? ? ?Radiographic Findings: ?CT Chest Wo Contrast ? ?Result Date: 03/31/2022 ?CLINICAL DATA:  Left lung cancer restaging, status post lingular resection, chemotherapy, and XRT * Tracking Code: BO * EXAM: CT CHEST WITHOUT CONTRAST TECHNIQUE: Multidetector CT imaging of the chest was performed following the standard protocol without IV contrast. RADIATION DOSE REDUCTION: This exam was performed according to the departmental dose-optimization program which includes automated exposure control, adjustment of the mA and/or kV according to patient size and/or use of iterative reconstruction technique. COMPARISON:  PET-CT, 12/02/2021, CT chest, 11/19/2021 FINDINGS: Cardiovascular: Aortic atherosclerosis. Normal heart size. Three-vessel coronary artery calcifications. No pericardial effusion. Mediastinum/Nodes: Enlarged, previously hypermetabolic AP window lymph node is diminished in size, measuring 1.3 x 1.1 cm, previously 1.8 x 1.5 cm (series 2, image 65). Thyroid gland, trachea, and esophagus demonstrate no significant findings. Lungs/Pleura: Sepsis lingular resection. Background of very fine centrilobular pulmonary nodules, most concentrated in the lung apices. Diffuse bilateral bronchial wall thickening. Bandlike scarring of the lung bases. Stable, benign small 0.5 cm nodule of the right upper lobe (series 5, image 59). No pleural effusion or pneumothorax. Upper Abdomen: No acute abnormality. Musculoskeletal: No  chest wall abnormality. No suspicious osseous lesions identified. IMPRESSION: 1. Status post lingular resection. 2. Enlarged, previously hypermetabolic AP window lymph node is diminished in size, consistent with treatment response. 3. No evidence of new metastatic disease in the chest. 4. Background of very fine centrilobular pulmonary nodules, most concentrated in the lung apices, as well as diffuse bilateral bronchial wall thickening, consistent with smoking-related respiratory bronchiolitis. 5. Coronary artery disease. Aortic Atherosclerosis (ICD10-I70.0). Electronically Signed   By: Delanna Ahmadi M.D.   On: 03/31/2022 10:00   ? ?Impression: Recurrent adenocarcinoma of the lung ?  ?Recurrent non-small cell lung cancer initially diagnosed as stage IA (T1b, N0, M0) non-small cell lung cancer, adenocarcinoma presented with left upper lobe lung nodule diagnosed in January 2020.  The patient has evidence for disease recurrence in AP window lymphadenopathy in December 2022.  ? ?The patient is recovering from the effects of radiation.  He continues to have some odynophagia and dysphagia and I recommended he continue on the Carafate till this clears up.  Refill sent to his pharmacy.  He denies any significant breathing issues at this time.  He denies any hemoptysis. ? ?Plan: As needed follow-up in radiation oncology.  He will continue on immunotherapy and continue to see  Dr. Julien Nordmann on a regular basis. ? ? ? ?____________________________________ ? ?Blair Promise, PhD, MD ? ?This document serves as a record of services personally performed by Gery Pray, MD. It was created on his behalf by Roney Mans, a trained medical scribe. The creation of this record is based on the scribe's personal observations and the provider's statements to them. This document has been checked and approved by the attending provider. ? ?

## 2022-04-17 NOTE — Progress Notes (Incomplete)
?  Radiation Oncology         [phone] ?________________________________ ? ?Patient Name: Jack Taylor ?MRN: [id number] ?DOB: 12/09/1945 ?Referring Physician: Curt Bears (Profile Not Attached) ?Date of Service: 03/18/2022 ?[facility], [place] ? ?                                                      End Of Treatment Note ? ?Diagnoses: C34.92-Malignant neoplasm of unspecified part of left bronchus or lung ? ?Cancer Staging: Recurrent adenocarcinoma of the lung ?  ?Recurrent non-small cell lung cancer initially diagnosed as stage IA (T1b, N0, M0) non-small cell lung cancer, adenocarcinoma presented with left upper lobe lung nodule diagnosed in January 2020.  The patient has evidence for disease recurrence in AP window lymphadenopathy in December 2022.  ? ?Intent: Curative ? ?Radiation Treatment Dates: 02/07/2022 through 03/18/2022 ?Site Technique Total Dose (Gy) Dose per Fx (Gy) Completed Fx Beam Energies  ?Lung, Left: Lung_L 3D 60/60 2 30/30 6X, 10X  ? ?Narrative: The patient tolerated radiation therapy relatively well. During his final weekly treatment check on 03/15/22, the patient reported chest soreness which was helped with pain medication, fatigue, weakness, a rash on his back which resolved with sonafine, exertional dyspnea, dry cough, and pain with swallowing (using carafate and percocet for pain).  ? ?Plan: The patient will follow-up with radiation oncology in one month . ? ?________________________________________________ ?----------------------------------- ? ?Blair Promise, PhD, MD ? ?This document serves as a record of services personally performed by Gery Pray, MD. It was created on his behalf by Roney Mans, a trained medical scribe. The creation of this record is based on the scribe's personal observations and the provider's statements to them. This document has been checked and approved by the attending provider. ? ?

## 2022-04-18 ENCOUNTER — Encounter: Payer: Self-pay | Admitting: Radiation Oncology

## 2022-04-18 ENCOUNTER — Other Ambulatory Visit: Payer: Self-pay

## 2022-04-18 ENCOUNTER — Ambulatory Visit
Admission: RE | Admit: 2022-04-18 | Discharge: 2022-04-18 | Disposition: A | Discharge status: Home / Self Care | Payer: PPO | Source: Ambulatory Visit | Attending: Radiation Oncology | Admitting: Radiation Oncology

## 2022-04-18 DIAGNOSIS — C3492 Malignant neoplasm of unspecified part of left bronchus or lung: Secondary | ICD-10-CM | POA: Diagnosis present

## 2022-04-18 DIAGNOSIS — I2510 Atherosclerotic heart disease of native coronary artery without angina pectoris: Secondary | ICD-10-CM | POA: Insufficient documentation

## 2022-04-18 DIAGNOSIS — Z79899 Other long term (current) drug therapy: Secondary | ICD-10-CM | POA: Diagnosis not present

## 2022-04-18 DIAGNOSIS — Z923 Personal history of irradiation: Secondary | ICD-10-CM | POA: Insufficient documentation

## 2022-04-18 DIAGNOSIS — I700 Atherosclerosis of aorta: Secondary | ICD-10-CM | POA: Diagnosis not present

## 2022-04-18 HISTORY — DX: Personal history of irradiation: Z92.3

## 2022-04-18 MED ORDER — SUCRALFATE 1 G PO TABS: 1.0000 g | ORAL_TABLET | Freq: Three times a day (TID) | ORAL | 1 refills | Status: AC

## 2022-04-18 NOTE — Progress Notes (Signed)
Jack Taylor is here today for follow up post radiation to the lung. ? ?Lung Side: Left,  patient completed treatment on 03/18/22.  ? ?Does the patient complain of any of the following: ?Pain:Patient denies pain today, however continues to have pain to to left side when laying down.  ?Shortness of breath w/wo exertion: Yes, on exertion.  ?Cough: Yes, dry cough ?Hemoptysis: No ?Pain with swallowing: Yes, intermittent, reports improvement with swallowing. Patient continues to take carafate as directed.  ?Swallowing/choking concerns: No ?Appetite: good ?Weight: ?Wt Readings from Last 3 Encounters:  ?04/12/22 170 lb 8 oz (77.3 kg)  ?04/04/22 171 lb 2 oz (77.6 kg)  ?03/14/22 177 lb 9.6 oz (80.6 kg)  ? ?Energy Level: Patient states his energy level is improving slowly.  ?Post radiation skin Changes: Patient continue to have redness and itching to left chest and left back.  ? ? ? ?Additional comments if applicable: Patient request refill on oxycodone-apap 5-325mg  due to pain at night  ? ?Vitals:  ? 04/18/22 0954  ?BP: (!) 108/56  ?Pulse: 60  ?Resp: 17  ?Temp: 97.8 ?F (36.6 ?C)  ?SpO2: 99%  ?Weight: 172 lb 9.6 oz (78.3 kg)  ?Height: 5\' 8"  (1.727 m)  ?   ?

## 2022-04-29 ENCOUNTER — Telehealth: Payer: Self-pay | Admitting: *Deleted

## 2022-05-03 ENCOUNTER — Encounter: Payer: Self-pay | Admitting: Internal Medicine

## 2022-05-10 ENCOUNTER — Inpatient Hospital Stay: Payer: PPO | Attending: Internal Medicine | Admitting: Internal Medicine

## 2022-05-10 ENCOUNTER — Encounter: Payer: Self-pay | Admitting: Internal Medicine

## 2022-05-10 ENCOUNTER — Inpatient Hospital Stay: Payer: PPO

## 2022-05-10 ENCOUNTER — Other Ambulatory Visit: Payer: Self-pay

## 2022-05-10 VITALS — BP 114/57 | HR 56 | Temp 98.0°F | Resp 17 | Ht 68.0 in | Wt 170.7 lb

## 2022-05-10 DIAGNOSIS — C3492 Malignant neoplasm of unspecified part of left bronchus or lung: Secondary | ICD-10-CM

## 2022-05-10 DIAGNOSIS — F419 Anxiety disorder, unspecified: Secondary | ICD-10-CM | POA: Insufficient documentation

## 2022-05-10 DIAGNOSIS — C3412 Malignant neoplasm of upper lobe, left bronchus or lung: Secondary | ICD-10-CM | POA: Insufficient documentation

## 2022-05-10 DIAGNOSIS — Z79899 Other long term (current) drug therapy: Secondary | ICD-10-CM | POA: Diagnosis not present

## 2022-05-10 DIAGNOSIS — Z5112 Encounter for antineoplastic immunotherapy: Secondary | ICD-10-CM | POA: Diagnosis present

## 2022-05-10 DIAGNOSIS — J449 Chronic obstructive pulmonary disease, unspecified: Secondary | ICD-10-CM | POA: Insufficient documentation

## 2022-05-10 DIAGNOSIS — Z923 Personal history of irradiation: Secondary | ICD-10-CM | POA: Insufficient documentation

## 2022-05-10 LAB — CMP (CANCER CENTER ONLY)
ALT: 15 U/L (ref 0–44)
AST: 14 U/L — ABNORMAL LOW (ref 15–41)
Albumin: 3.8 g/dL (ref 3.5–5.0)
Alkaline Phosphatase: 66 U/L (ref 38–126)
Anion gap: 6 (ref 5–15)
BUN: 13 mg/dL (ref 8–23)
CO2: 26 mmol/L (ref 22–32)
Calcium: 9.4 mg/dL (ref 8.9–10.3)
Chloride: 107 mmol/L (ref 98–111)
Creatinine: 1.58 mg/dL — ABNORMAL HIGH (ref 0.61–1.24)
GFR, Estimated: 45 mL/min — ABNORMAL LOW (ref >60)
Glucose, Bld: 178 mg/dL — ABNORMAL HIGH (ref 70–99)
Potassium: 3.7 mmol/L (ref 3.5–5.1)
Sodium: 139 mmol/L (ref 135–145)
Total Bilirubin: 0.5 mg/dL (ref 0.3–1.2)
Total Protein: 6.6 g/dL (ref 6.5–8.1)

## 2022-05-10 LAB — CBC WITH DIFFERENTIAL (CANCER CENTER ONLY)
Abs Immature Granulocytes: 0.01 10*3/uL (ref 0.00–0.07)
Basophils Absolute: 0 10*3/uL (ref 0.0–0.1)
Basophils Relative: 0 %
Eosinophils Absolute: 0.1 10*3/uL (ref 0.0–0.5)
Eosinophils Relative: 3 %
HCT: 33.8 % — ABNORMAL LOW (ref 39.0–52.0)
Hemoglobin: 12 g/dL — ABNORMAL LOW (ref 13.0–17.0)
Immature Granulocytes: 0 %
Lymphocytes Relative: 18 %
Lymphs Abs: 0.6 10*3/uL — ABNORMAL LOW (ref 0.7–4.0)
MCH: 35.3 pg — ABNORMAL HIGH (ref 26.0–34.0)
MCHC: 35.5 g/dL (ref 30.0–36.0)
MCV: 99.4 fL (ref 80.0–100.0)
Monocytes Absolute: 0.4 10*3/uL (ref 0.1–1.0)
Monocytes Relative: 12 %
Neutro Abs: 2.2 10*3/uL (ref 1.7–7.7)
Neutrophils Relative %: 67 %
Platelet Count: 127 10*3/uL — ABNORMAL LOW (ref 150–400)
RBC: 3.4 MIL/uL — ABNORMAL LOW (ref 4.22–5.81)
RDW: 13.2 % (ref 11.5–15.5)
WBC Count: 3.3 10*3/uL — ABNORMAL LOW (ref 4.0–10.5)
nRBC: 0 % (ref 0.0–0.2)

## 2022-05-10 LAB — TSH: TSH: 2.481 u[IU]/mL (ref 0.350–4.500)

## 2022-05-10 MED ORDER — SODIUM CHLORIDE 0.9 % IV SOLN
1500.0000 mg | Freq: Once | INTRAVENOUS | Status: AC
  Administered 2022-05-10: 1500 mg via INTRAVENOUS
  Filled 2022-05-10: qty 30

## 2022-05-10 MED ORDER — SODIUM CHLORIDE 0.9 % IV SOLN: Freq: Once | INTRAVENOUS | Status: AC

## 2022-05-10 NOTE — Patient Instructions (Signed)
[facility]   Discharge Instructions: Thank you for choosing [facility] to provide your oncology and hematology care.   If you have a lab appointment with the Cancer Center, please go directly to the Cancer Center and check in at the registration area.   Wear comfortable clothing and clothing appropriate for easy access to any Portacath or PICC line.   We strive to give you quality time with your provider. You may need to reschedule your appointment if you arrive late (15 or more minutes).  Arriving late affects you and other patients whose appointments are after yours.  Also, if you miss three or more appointments without notifying the office, you may be dismissed from the clinic at the provider's discretion.      For prescription refill requests, have your pharmacy contact our office and allow 72 hours for refills to be completed.    Today you received the following chemotherapy and/or immunotherapy agents: Durvalumab (Imfinzi).      To help prevent nausea and vomiting after your treatment, we encourage you to take your nausea medication as directed.  BELOW ARE SYMPTOMS THAT SHOULD BE REPORTED IMMEDIATELY: *FEVER GREATER THAN 100.4 F (38 C) OR HIGHER *CHILLS OR SWEATING *NAUSEA AND VOMITING THAT IS NOT CONTROLLED WITH YOUR NAUSEA MEDICATION *UNUSUAL SHORTNESS OF BREATH *UNUSUAL BRUISING OR BLEEDING *URINARY PROBLEMS (pain or burning when urinating, or frequent urination) *BOWEL PROBLEMS (unusual diarrhea, constipation, pain near the anus) TENDERNESS IN MOUTH AND THROAT WITH OR WITHOUT PRESENCE OF ULCERS (sore throat, sores in mouth, or a toothache) UNUSUAL RASH, SWELLING OR PAIN  UNUSUAL VAGINAL DISCHARGE OR ITCHING   Items with * indicate a potential emergency and should be followed up as soon as possible or go to the Emergency Department if any problems should occur.  Please show the CHEMOTHERAPY ALERT CARD or IMMUNOTHERAPY ALERT CARD  at check-in to the Emergency Department and triage nurse.  Should you have questions after your visit or need to cancel or reschedule your appointment, please contact [facility]  Dept: [phone]  and follow the prompts.  Office hours are 8:00 a.m. to 4:30 p.m. Monday - Friday. Please note that voicemails left after 4:00 p.m. may not be returned until the following business day.  We are closed weekends and major holidays. You have access to a nurse at all times for urgent questions. Please call the main number to the clinic Dept: [phone] and follow the prompts.   For any non-urgent questions, you may also contact your provider using MyChart. We now offer e-Visits for anyone 18 and older to request care online for non-urgent symptoms. For details visit [email or web address].   Also download the MyChart app! Go to the app store, search "MyChart", open the app, select [facility], and log in with your MyChart username and password.  Due to Covid, a mask is required upon entering the hospital/clinic. If you do not have a mask, one will be given to you upon arrival. For doctor visits, patients may have 1 support person aged 18 or older with them. For treatment visits, patients cannot have anyone with them due to current Covid guidelines and our immunocompromised population.   

## 2022-05-10 NOTE — Progress Notes (Signed)
Ok to treat with serum creatinine of 1.58 today per Dr. Julien Nordmann.

## 2022-05-10 NOTE — Progress Notes (Signed)
[place] Telephone:([phone]) [phone]   Fax:([phone]  OFFICE PROGRESS NOTE  Jack Major, MD [address] [place] [address]  DIAGNOSIS: Recurrent non-small cell lung cancer initially diagnosed as stage IA (T1b, N0, M0) non-small cell lung cancer, adenocarcinoma presented with left upper lobe lung nodule diagnosed in January 2020.  The patient has evidence for disease recurrence in AP window lymphadenopathy in December 2022.   There was insufficient material for the molecular studies by [facility].   PRIOR THERAPY:  1) Status post left lingulectomy with lymph node dissection under the care of Dr. Servando Snare on January 01, 2019. 2) Concurrent chemoradiation with carboplatin for an AUC of 2 and paclitaxel 45 mg per metered squared weekly.  First dose on 01/24/2022.  Status post 7 cycles. Last dose was given March 14, 2022   CURRENT THERAPY: Consolidation treatment with immunotherapy with Imfinzi 1500 Mg IV every 4 weeks.  First dose Apr 12, 2022.  Status post 1 cycle.  INTERVAL HISTORY: Jack Taylor 76 y.o. male returns to the clinic today for follow-up visit accompanied by his wife.  The patient is feeling fine today with no concerning complaints.  He denied having any current chest pain, shortness of breath, cough or hemoptysis.  He has no nausea, vomiting, diarrhea or constipation.  He has occasional shortness of breath at nighttime secondary to anxiety but improves once he moves around.  He has no weight loss or night sweats.  He has no headache or visual changes.  He tolerated the first cycle of his treatment fairly well.   MEDICAL HISTORY: Past Medical History:  Diagnosis Date   Adenomatous colon polyp    Anxiety    Blood transfusion without reported diagnosis    Cancer ([place])    Lung; 11/2018   CKD (chronic kidney disease)    stage III 11/09/18   COPD (chronic obstructive pulmonary disease) (HCC)    Gastric ulcer    GERD  (gastroesophageal reflux disease)    High cholesterol    History of radiation therapy    Left Lung- 02/17/22-03/18/22-Dr. Gery Pray   Hypertension    Pre-diabetes    A1c 6.2% 11/09/18    ALLERGIES:  has No Known Allergies.  MEDICATIONS:  Current Outpatient Medications  Medication Sig Dispense Refill   albuterol (PROVENTIL HFA;VENTOLIN HFA) 108 (90 Base) MCG/ACT inhaler Inhale 2 puffs into the lungs every 6 (six) hours as needed for wheezing or shortness of breath. Reported on 06/02/2016 (Patient not taking: Reported on 02/14/2022)     amLODipine (NORVASC) 10 MG tablet Take 10 mg by mouth daily.      ANORO ELLIPTA 62.5-25 MCG/ACT AEPB Inhale 1 puff into the lungs daily.     esomeprazole (NEXIUM) 40 MG capsule Take 40 mg by mouth daily.      losartan (COZAAR) 50 MG tablet Take 50 mg by mouth daily.      oxyCODONE-acetaminophen (PERCOCET/ROXICET) 5-325 MG tablet Take 1 tablet by mouth every 6 (six) hours as needed for severe pain. 30 tablet 0   prochlorperazine (COMPAZINE) 10 MG tablet Take 1 tablet (10 mg total) by mouth every 6 (six) hours as needed. (Patient not taking: Reported on 02/28/2022) 30 tablet 2   rosuvastatin (CRESTOR) 10 MG tablet Take 10 mg by mouth daily.     sucralfate (CARAFATE) 1 g tablet Take 1 tablet (1 g total) by mouth 4 (four) times daily -  with meals and at bedtime. Crush  and dissolve in 10 mL of warm water prior to swallowing 120 tablet 1   traZODone (DESYREL) 50 MG tablet Take 50 mg by mouth at bedtime.     No current facility-administered medications for this visit.    SURGICAL HISTORY:  Past Surgical History:  Procedure Laterality Date   BRONCHIAL NEEDLE ASPIRATION BIOPSY  01/10/2022   Procedure: BRONCHIAL NEEDLE ASPIRATION BIOPSIES;  Surgeon: Collene Gobble, MD;  Location: [facility] ENDOSCOPY;  Service: Cardiopulmonary;;   COLONOSCOPY  2008   ESOPHAGOGASTRODUODENOSCOPY  11/23/2011   Procedure: ESOPHAGOGASTRODUODENOSCOPY (EGD);  Surgeon: Owens Loffler, MD;   Location: [facility];  Service: Endoscopy;  Laterality: N/A;   INTERCOSTAL NERVE BLOCK Left 01/01/2019   Procedure: INTERCOSTAL NERVE BLOCK;  Surgeon: Grace Isaac, MD;  Location: [facility];  Service: Thoracic;  Laterality: Left;   LUNG BIOPSY  12/18/2018   Procedure: LUNG BIOPSY - of Left Upper Lobe Biopsy of #7 Node Biopsy of #10L Node;  Surgeon: Grace Isaac, MD;  Location: [place];  Service: Thoracic;;   LYMPH NODE DISSECTION Left 01/01/2019   Procedure: LYMPH NODE DISSECTION;  Surgeon: Grace Isaac, MD;  Location: [facility];  Service: Thoracic;  Laterality: Left;   UPPER GASTROINTESTINAL ENDOSCOPY  11/23/11   VIDEO ASSISTED THORACOSCOPY (VATS)/WEDGE RESECTION Left 01/01/2019   Procedure: VIDEO ASSISTED THORACOSCOPY (VATS)/ LEFT LINGULECTOMY;  Surgeon: Grace Isaac, MD;  Location: [place];  Service: Thoracic;  Laterality: Left;   VIDEO BRONCHOSCOPY WITH ENDOBRONCHIAL NAVIGATION N/A 12/18/2018   Procedure: VIDEO BRONCHOSCOPY WITH ENDOBRONCHIAL NAVIGATION with Placement of Fiducial Markers x 3;  Surgeon: Grace Isaac, MD;  Location: [facility];  Service: Thoracic;  Laterality: N/A;   VIDEO BRONCHOSCOPY WITH ENDOBRONCHIAL ULTRASOUND N/A 12/18/2018   Procedure: VIDEO BRONCHOSCOPY WITH ENDOBRONCHIAL ULTRASOUND;  Surgeon: Grace Isaac, MD;  Location: [facility];  Service: Thoracic;  Laterality: N/A;   VIDEO BRONCHOSCOPY WITH ENDOBRONCHIAL ULTRASOUND Left 01/10/2022   Procedure: VIDEO BRONCHOSCOPY WITH ENDOBRONCHIAL ULTRASOUND;  Surgeon: Collene Gobble, MD;  Location: [facility];  Service: Cardiopulmonary;  Laterality: Left;    REVIEW OF SYSTEMS:  A comprehensive review of systems was negative except for: Constitutional: positive for fatigue   PHYSICAL EXAMINATION: General appearance: alert, cooperative, fatigued, and no distress Head: Normocephalic, without obvious abnormality, atraumatic Neck: no adenopathy, no JVD, supple, symmetrical, trachea midline, and thyroid not enlarged,  symmetric, no tenderness/mass/nodules Lymph nodes: Cervical, supraclavicular, and axillary nodes normal. Resp: clear to auscultation bilaterally Back: symmetric, no curvature. ROM normal. No CVA tenderness. Cardio: regular rate and rhythm, S1, S2 normal, no murmur, click, rub or gallop GI: soft, non-tender; bowel sounds normal; no masses,  no organomegaly Extremities: extremities normal, atraumatic, no cyanosis or edema  ECOG PERFORMANCE STATUS: 1 - Symptomatic but completely ambulatory  Blood pressure (!) 114/57, pulse (!) 56, temperature 98 F (36.7 C), temperature source Tympanic, resp. rate 17, height 5\' 8"  (1.727 m), weight 170 lb 11.2 oz (77.4 kg), SpO2 96 %.  LABORATORY DATA: Lab Results  Component Value Date   WBC 3.3 (L) 05/10/2022   HGB 12.0 (L) 05/10/2022   HCT 33.8 (L) 05/10/2022   MCV 99.4 05/10/2022   PLT 127 (L) 05/10/2022      Chemistry      Component Value Date/Time   NA 139 05/10/2022 1043   K 3.7 05/10/2022 1043   CL 107 05/10/2022 1043   CO2 26 05/10/2022 1043   BUN 13 05/10/2022 1043   CREATININE 1.58 (H) 05/10/2022 1043  Component Value Date/Time   CALCIUM 9.4 05/10/2022 1043   ALKPHOS 66 05/10/2022 1043   AST 14 (L) 05/10/2022 1043   ALT 15 05/10/2022 1043   BILITOT 0.5 05/10/2022 1043       RADIOGRAPHIC STUDIES: No results found.  ASSESSMENT AND PLAN: This is a very pleasant 76 years old white male diagnosed with recurrent non-small cell lung cancer that was initially diagnosed as stage IA (T1b, N0, M0) non-small cell lung cancer, adenocarcinoma presented with left upper lobe lung nodule in January 2020.  The patient has evidence for disease recurrence in AP window lymphadenopathy in December 2022.  He is status post left lingulectomy with lymph node dissection under the care of Dr. Servando Snare on January 01, 2019.  This was biopsy-proven to be recurrent adenocarcinoma of the lung. The patient underwent a course of concurrent chemoradiation with  weekly carboplatin for AUC of 2 and paclitaxel 45 Mg/M2 status post 7 cycles.  Last dose was given March 14, 2022.  The patient has partial response to this treatment. The patient is currently undergoing consolidation treatment with immunotherapy with Imfinzi 1500 Mg IV every 4 weeks status post 1 cycle.  He tolerated the first cycle of his treatment fairly well with no concerning adverse effects except for mild fatigue. I recommended for the patient to proceed with cycle #2 today as planned. I will see him back for follow-up visit in 4 weeks before the next cycle of his treatment. The patient voices understanding of current disease status and treatment options and is in agreement with the current care plan.  All questions were answered. The patient knows to call the clinic with any problems, questions or concerns. We can certainly see the patient much sooner if necessary.  Disclaimer: This note was dictated with voice recognition software. Similar sounding words can inadvertently be transcribed and may not be corrected upon review.

## 2022-05-26 ENCOUNTER — Encounter: Payer: Self-pay | Admitting: *Deleted

## 2022-05-26 NOTE — Progress Notes (Signed)
I followed up on Jack Taylor treatment plan and noted he is set up at this time.

## 2022-06-02 NOTE — Progress Notes (Signed)
[facility] OFFICE PROGRESS NOTE  [name], MD [address] [place] [address]  DIAGNOSIS: : Recurrent non-small cell lung cancer initially diagnosed as stage IA (T1b, N0, M0) non-small cell lung cancer, adenocarcinoma presented with left upper lobe lung nodule diagnosed in January 2020.  The patient has evidence for disease recurrence in AP window lymphadenopathy in December 2022.  There was insufficient material for the molecular studies by [facility].  PRIOR THERAPY: 1) Status post left lingulectomy with lymph node dissection under the care of Dr. Servando Snare on January 01, 2019 2) Concurrent chemoradiation with carboplatin for an AUC of 2 and paclitaxel 45 mg per metered squared weekly.  First dose on 01/24/2022.  Status post 7 cycles. Last dose was given March 14, 2022  CURRENT THERAPY: Consolidation treatment with immunotherapy with Imfinzi 1500 Mg IV every 4 weeks.  First dose Apr 12, 2022.  Status post 2 cycles.  INTERVAL HISTORY: [facility] 76 y.o. male returns to the clinic today for a follow-up visit accompanied by his wife.  The patient is feeling fairly well today without any concerning complaints.  The patient is currently undergoing treatment with immunotherapy with Imfinzi and is tolerating this well without any concerning adverse side effects.  Today he denies any recent fever, chills, night sweats, or unexplained weight loss.  Denies any chest pain, cough, or hemoptysis.  He reports stable to improved dyspnea on exertion.  If needed, he will use his ProAir inhaler.  The patient's wife mentions that he has not needed to use it often.  He does feel like his breathing is improved compared to prior and he is able to walk to the mailbox without getting short of breath which was an issue previously.  The patient's wife also mentions that he is breathing better when he sleeps.  Denies cough.  Overall, denies any nausea, vomiting, or diarrhea.  The  patient's wife mentions that he sometimes has mild constipation but he will use an over-the-counter laxative which works well for him.  Denies any headache or visual changes.  Denies any rashes or skin changes.  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  The patient and his wife report that his bradycardia was nonconcerning and no further work-up or management was needed from a cardiology standpoint.  He is here today for evaluation and repeat blood work before undergoing cycle #3.    MEDICAL HISTORY: Past Medical History:  Diagnosis Date   Adenomatous colon polyp    Anxiety    Blood transfusion without reported diagnosis    Cancer ([facility])    Lung; 11/2018   CKD (chronic kidney disease)    stage III 11/09/18   COPD (chronic obstructive pulmonary disease) (HCC)    Gastric ulcer    GERD (gastroesophageal reflux disease)    High cholesterol    History of radiation therapy    Left Lung- 02/17/22-03/18/22-Dr. Gery Pray   Hypertension    Pre-diabetes    A1c 6.2% 11/09/18    ALLERGIES:  has No Known Allergies.  MEDICATIONS:  Current Outpatient Medications  Medication Sig Dispense Refill   albuterol (PROVENTIL HFA;VENTOLIN HFA) 108 (90 Base) MCG/ACT inhaler Inhale 2 puffs into the lungs every 6 (six) hours as needed for wheezing or shortness of breath. Reported on 06/02/2016     amLODipine (NORVASC) 10 MG tablet Take 10 mg by mouth daily.      Jearl Klinefelter  ELLIPTA 62.5-25 MCG/ACT AEPB Inhale 1 puff into the lungs daily.     diazepam (VALIUM) 2 MG tablet Take 2 mg by mouth at bedtime as needed.     esomeprazole (NEXIUM) 40 MG capsule Take 40 mg by mouth daily.      losartan (COZAAR) 50 MG tablet Take 50 mg by mouth daily.      prochlorperazine (COMPAZINE) 10 MG tablet Take 1 tablet (10 mg total) by mouth every 6 (six) hours as needed. 30 tablet 2   rosuvastatin (CRESTOR) 10 MG tablet Take 10 mg by mouth daily.      sucralfate (CARAFATE) 1 g tablet Take 1 tablet (1 g total) by mouth 4 (four) times daily -  with meals and at bedtime. Crush and dissolve in 10 mL of warm water prior to swallowing 120 tablet 1   traZODone (DESYREL) 50 MG tablet Take 50 mg by mouth at bedtime.     No current facility-administered medications for this visit.    SURGICAL HISTORY:  Past Surgical History:  Procedure Laterality Date   BRONCHIAL NEEDLE ASPIRATION BIOPSY  01/10/2022   Procedure: BRONCHIAL NEEDLE ASPIRATION BIOPSIES;  Surgeon: Collene Gobble, MD;  Location: [facility] ENDOSCOPY;  Service: Cardiopulmonary;;   COLONOSCOPY  2008   ESOPHAGOGASTRODUODENOSCOPY  11/23/2011   Procedure: ESOPHAGOGASTRODUODENOSCOPY (EGD);  Surgeon: Owens Loffler, MD;  Location: [facility] ENDOSCOPY;  Service: Endoscopy;  Laterality: N/A;   INTERCOSTAL NERVE BLOCK Left 01/01/2019   Procedure: INTERCOSTAL NERVE BLOCK;  Surgeon: Grace Isaac, MD;  Location: [facility];  Service: Thoracic;  Laterality: Left;   LUNG BIOPSY  12/18/2018   Procedure: LUNG BIOPSY - of Left Upper Lobe Biopsy of #7 Node Biopsy of #10L Node;  Surgeon: Grace Isaac, MD;  Location: [facility];  Service: Thoracic;;   LYMPH NODE DISSECTION Left 01/01/2019   Procedure: LYMPH NODE DISSECTION;  Surgeon: Grace Isaac, MD;  Location: [facility];  Service: Thoracic;  Laterality: Left;   UPPER GASTROINTESTINAL ENDOSCOPY  11/23/11   VIDEO ASSISTED THORACOSCOPY (VATS)/WEDGE RESECTION Left 01/01/2019   Procedure: VIDEO ASSISTED THORACOSCOPY (VATS)/ LEFT LINGULECTOMY;  Surgeon: Grace Isaac, MD;  Location: [facility];  Service: Thoracic;  Laterality: Left;   VIDEO BRONCHOSCOPY WITH ENDOBRONCHIAL NAVIGATION N/A 12/18/2018   Procedure: VIDEO BRONCHOSCOPY WITH ENDOBRONCHIAL NAVIGATION with Placement of Fiducial Markers x 3;  Surgeon: Grace Isaac, MD;  Location: [place];  Service: Thoracic;  Laterality: N/A;   VIDEO BRONCHOSCOPY WITH ENDOBRONCHIAL ULTRASOUND N/A 12/18/2018   Procedure: VIDEO  BRONCHOSCOPY WITH ENDOBRONCHIAL ULTRASOUND;  Surgeon: Grace Isaac, MD;  Location: [facility];  Service: Thoracic;  Laterality: N/A;   VIDEO BRONCHOSCOPY WITH ENDOBRONCHIAL ULTRASOUND Left 01/10/2022   Procedure: VIDEO BRONCHOSCOPY WITH ENDOBRONCHIAL ULTRASOUND;  Surgeon: Collene Gobble, MD;  Location: [facility];  Service: Cardiopulmonary;  Laterality: Left;    REVIEW OF SYSTEMS:   Review of Systems  Constitutional: Negative for appetite change, chills, fatigue, fever and unexpected weight change.  HENT: Negative for mouth sores, nosebleeds, sore throat and trouble swallowing.   Eyes: Negative for eye problems and icterus.  Respiratory: Positive for improved but persistent dyspnea on exertion with certain activities.  Negative for cough, hemoptysis, and wheezing.   Cardiovascular: Negative for chest pain and leg swelling.  Gastrointestinal: Positive for occasional mild constipation.  Negative for abdominal pain, diarrhea, nausea and vomiting.  Genitourinary: Negative for bladder incontinence, difficulty urinating, dysuria, frequency and hematuria.   Musculoskeletal: Negative for back pain, gait problem, neck pain and neck stiffness.  Skin: Negative for itching and rash.  Neurological: Negative for dizziness, extremity weakness, gait problem, headaches, light-headedness and seizures.  Hematological: Negative for adenopathy. Does not bruise/bleed easily.  Psychiatric/Behavioral: Negative for confusion, depression and sleep disturbance. The patient is not nervous/anxious.     PHYSICAL EXAMINATION:  Blood pressure 119/62, pulse (!) 48, temperature (!) 97.5 F (36.4 C), temperature source Oral, resp. rate 16, height 5\' 8"  (1.727 m), SpO2 97 %.  ECOG PERFORMANCE STATUS: 1  Physical Exam  Constitutional: Oriented to person, place, and time and well-developed, well-nourished, and in no distress.  HENT:  Head: Normocephalic and atraumatic.  Hard of hearing Mouth/Throat: Oropharynx is clear  and moist. No oropharyngeal exudate.  Eyes: Conjunctivae are normal. Right eye exhibits no discharge. Left eye exhibits no discharge. No scleral icterus.  Neck: Normal range of motion. Neck supple.  Cardiovascular: Bradycardic, regular rhythm, normal heart sounds and intact distal pulses.   Pulmonary/Chest: Effort normal and breath sounds normal. No respiratory distress. No wheezes. No rales.  Abdominal: Soft. Bowel sounds are normal. Exhibits no distension and no mass. There is no tenderness.  Musculoskeletal: Normal range of motion. Exhibits no edema.  Lymphadenopathy:    No cervical adenopathy.  Neurological: Alert and oriented to person, place, and time. Exhibits normal muscle tone. Gait normal. Coordination normal.  Skin: Skin is warm and dry. No rash noted. Not diaphoretic. No erythema. No pallor.  Psychiatric: Mood, memory and judgment normal.  Vitals reviewed.  LABORATORY DATA: Lab Results  Component Value Date   WBC 4.3 06/08/2022   HGB 13.5 06/08/2022   HCT 37.7 (L) 06/08/2022   MCV 97.7 06/08/2022   PLT 134 (L) 06/08/2022      Chemistry      Component Value Date/Time   NA 140 06/08/2022 0844   K 4.0 06/08/2022 0844   CL 108 06/08/2022 0844   CO2 27 06/08/2022 0844   BUN 19 06/08/2022 0844   CREATININE 1.85 (H) 06/08/2022 0844      Component Value Date/Time   CALCIUM 9.3 06/08/2022 0844   ALKPHOS 63 06/08/2022 0844   AST 16 06/08/2022 0844   ALT 15 06/08/2022 0844   BILITOT 0.5 06/08/2022 0844       RADIOGRAPHIC STUDIES:  No results found.   ASSESSMENT/PLAN:  This is a very pleasant 76 year old Caucasian male diagnosed with recurrent non-small cell lung cancer that was initially diagnosed as a stage Ia (T1b, N0, M0) non-small cell lung cancer, adenocarcinoma.  He initially presented with a left upper lobe lung nodule in January 2020.  He had evidence of disease recurrence with an AP window lymphadenopathy in December 2022.  He is status post left  lingulectomy with lymph node dissection under the care of Dr. Servando Snare on 01/01/2019.  This was biopsy-proven to be disease recurrent adenocarcinoma lung.  He then underwent a course of concurrent chemoradiation with carboplatin for an AUC 2 and paclitaxel 45 mg per metered squared.  He is status post 7 cycles.  His last dose was given on 03/14/2022.  The patient had a partial response to treatment.  He is currently undergoing consolidation immunotherapy with Imfinzi for 200 mg IV every 4 weeks.  He status post 2 cycles and is tolerated well without any concerning adverse side effects.  Labs were reviewed.  Recommend that he proceed with cycle number 3 today scheduled.  The patient has CKD and his creatinine is 1.85 today.  Okay to proceed with his creatinine today.  We will see him back  for follow-up visit in 4 weeks for evaluation and repeat blood work before undergoing cycle #4.   I will arrange for restaging CT scan the chest without contrast for his next appointment.  The patient has bradycardia today.  The patient previously had a work-up performed by cardiology and no further work-up or management was required.  The patient's wife states that they are "lucky" if his heart rate ever goes above 50 bpm.  Patient is asymptomatic.  Okay to proceed with treatment today with heart rate.  Pulse rechecked today which was 48 bpm.   The patient was advised to call immediately if he has any concerning symptoms in the interval. The patient voices understanding of current disease status and treatment options and is in agreement with the current care plan. All questions were answered. The patient knows to call the clinic with any problems, questions or concerns. We can certainly see the patient much sooner if necessary  Orders Placed This Encounter  Procedures   CT Chest Wo Contrast    Standing Status:   Future    Standing Expiration Date:   06/08/2023    Order Specific Question:   Preferred imaging  location?    Answer:   [facility]     The total time spent in the appointment was 20-29 minutes  Renji Berwick L Jonah Nestle, PA-C 06/08/22

## 2022-06-08 ENCOUNTER — Other Ambulatory Visit: Payer: Self-pay

## 2022-06-08 ENCOUNTER — Inpatient Hospital Stay: Payer: PPO

## 2022-06-08 ENCOUNTER — Inpatient Hospital Stay: Payer: PPO | Attending: Internal Medicine | Admitting: Physician Assistant

## 2022-06-08 VITALS — BP 119/62 | HR 48 | Temp 97.5°F | Resp 16 | Ht 68.0 in

## 2022-06-08 VITALS — BP 122/59 | HR 49 | Temp 97.6°F | Resp 16 | Wt 172.8 lb

## 2022-06-08 DIAGNOSIS — Z79899 Other long term (current) drug therapy: Secondary | ICD-10-CM | POA: Diagnosis not present

## 2022-06-08 DIAGNOSIS — R0609 Other forms of dyspnea: Secondary | ICD-10-CM | POA: Insufficient documentation

## 2022-06-08 DIAGNOSIS — C3492 Malignant neoplasm of unspecified part of left bronchus or lung: Secondary | ICD-10-CM

## 2022-06-08 DIAGNOSIS — J449 Chronic obstructive pulmonary disease, unspecified: Secondary | ICD-10-CM | POA: Insufficient documentation

## 2022-06-08 DIAGNOSIS — Z5112 Encounter for antineoplastic immunotherapy: Secondary | ICD-10-CM | POA: Diagnosis not present

## 2022-06-08 DIAGNOSIS — K5900 Constipation, unspecified: Secondary | ICD-10-CM | POA: Insufficient documentation

## 2022-06-08 DIAGNOSIS — Z923 Personal history of irradiation: Secondary | ICD-10-CM | POA: Diagnosis not present

## 2022-06-08 DIAGNOSIS — C3412 Malignant neoplasm of upper lobe, left bronchus or lung: Secondary | ICD-10-CM | POA: Insufficient documentation

## 2022-06-08 DIAGNOSIS — R001 Bradycardia, unspecified: Secondary | ICD-10-CM | POA: Diagnosis not present

## 2022-06-08 LAB — CMP (CANCER CENTER ONLY)
ALT: 15 U/L (ref 0–44)
AST: 16 U/L (ref 15–41)
Albumin: 4 g/dL (ref 3.5–5.0)
Alkaline Phosphatase: 63 U/L (ref 38–126)
Anion gap: 5 (ref 5–15)
BUN: 19 mg/dL (ref 8–23)
CO2: 27 mmol/L (ref 22–32)
Calcium: 9.3 mg/dL (ref 8.9–10.3)
Chloride: 108 mmol/L (ref 98–111)
Creatinine: 1.85 mg/dL — ABNORMAL HIGH (ref 0.61–1.24)
GFR, Estimated: 37 mL/min — ABNORMAL LOW (ref >60)
Glucose, Bld: 75 mg/dL (ref 70–99)
Potassium: 4 mmol/L (ref 3.5–5.1)
Sodium: 140 mmol/L (ref 135–145)
Total Bilirubin: 0.5 mg/dL (ref 0.3–1.2)
Total Protein: 6.9 g/dL (ref 6.5–8.1)

## 2022-06-08 LAB — TSH: TSH: 4.509 u[IU]/mL — ABNORMAL HIGH (ref 0.350–4.500)

## 2022-06-08 LAB — CBC WITH DIFFERENTIAL (CANCER CENTER ONLY)
Abs Immature Granulocytes: 0.01 10*3/uL (ref 0.00–0.07)
Basophils Absolute: 0 10*3/uL (ref 0.0–0.1)
Basophils Relative: 1 %
Eosinophils Absolute: 0.2 10*3/uL (ref 0.0–0.5)
Eosinophils Relative: 4 %
HCT: 37.7 % — ABNORMAL LOW (ref 39.0–52.0)
Hemoglobin: 13.5 g/dL (ref 13.0–17.0)
Immature Granulocytes: 0 %
Lymphocytes Relative: 19 %
Lymphs Abs: 0.8 10*3/uL (ref 0.7–4.0)
MCH: 35 pg — ABNORMAL HIGH (ref 26.0–34.0)
MCHC: 35.8 g/dL (ref 30.0–36.0)
MCV: 97.7 fL (ref 80.0–100.0)
Monocytes Absolute: 0.6 10*3/uL (ref 0.1–1.0)
Monocytes Relative: 15 %
Neutro Abs: 2.6 10*3/uL (ref 1.7–7.7)
Neutrophils Relative %: 61 %
Platelet Count: 134 10*3/uL — ABNORMAL LOW (ref 150–400)
RBC: 3.86 MIL/uL — ABNORMAL LOW (ref 4.22–5.81)
RDW: 11.6 % (ref 11.5–15.5)
WBC Count: 4.3 10*3/uL (ref 4.0–10.5)
nRBC: 0 % (ref 0.0–0.2)

## 2022-06-08 MED ORDER — SODIUM CHLORIDE 0.9 % IV SOLN
1500.0000 mg | Freq: Once | INTRAVENOUS | Status: AC
  Administered 2022-06-08: 1500 mg via INTRAVENOUS
  Filled 2022-06-08: qty 30

## 2022-06-08 MED ORDER — SODIUM CHLORIDE 0.9 % IV SOLN: Freq: Once | INTRAVENOUS | Status: AC

## 2022-06-08 NOTE — Patient Instructions (Signed)
[facility]   Discharge Instructions: Thank you for choosing [facility] to provide your oncology and hematology care.   If you have a lab appointment with the Cancer Center, please go directly to the Cancer Center and check in at the registration area.   Wear comfortable clothing and clothing appropriate for easy access to any Portacath or PICC line.   We strive to give you quality time with your provider. You may need to reschedule your appointment if you arrive late (15 or more minutes).  Arriving late affects you and other patients whose appointments are after yours.  Also, if you miss three or more appointments without notifying the office, you may be dismissed from the clinic at the provider's discretion.      For prescription refill requests, have your pharmacy contact our office and allow 72 hours for refills to be completed.    Today you received the following chemotherapy and/or immunotherapy agents: Durvalumab (Imfinzi).      To help prevent nausea and vomiting after your treatment, we encourage you to take your nausea medication as directed.  BELOW ARE SYMPTOMS THAT SHOULD BE REPORTED IMMEDIATELY: *FEVER GREATER THAN 100.4 F (38 C) OR HIGHER *CHILLS OR SWEATING *NAUSEA AND VOMITING THAT IS NOT CONTROLLED WITH YOUR NAUSEA MEDICATION *UNUSUAL SHORTNESS OF BREATH *UNUSUAL BRUISING OR BLEEDING *URINARY PROBLEMS (pain or burning when urinating, or frequent urination) *BOWEL PROBLEMS (unusual diarrhea, constipation, pain near the anus) TENDERNESS IN MOUTH AND THROAT WITH OR WITHOUT PRESENCE OF ULCERS (sore throat, sores in mouth, or a toothache) UNUSUAL RASH, SWELLING OR PAIN  UNUSUAL VAGINAL DISCHARGE OR ITCHING   Items with * indicate a potential emergency and should be followed up as soon as possible or go to the Emergency Department if any problems should occur.  Please show the CHEMOTHERAPY ALERT CARD or IMMUNOTHERAPY ALERT CARD  at check-in to the Emergency Department and triage nurse.  Should you have questions after your visit or need to cancel or reschedule your appointment, please contact [facility]  Dept: [phone]  and follow the prompts.  Office hours are 8:00 a.m. to 4:30 p.m. Monday - Friday. Please note that voicemails left after 4:00 p.m. may not be returned until the following business day.  We are closed weekends and major holidays. You have access to a nurse at all times for urgent questions. Please call the main number to the clinic Dept: [phone] and follow the prompts.   For any non-urgent questions, you may also contact your provider using MyChart. We now offer e-Visits for anyone 18 and older to request care online for non-urgent symptoms. For details visit [email or web address].   Also download the MyChart app! Go to the app store, search "MyChart", open the app, select [facility], and log in with your MyChart username and password.  Due to Covid, a mask is required upon entering the hospital/clinic. If you do not have a mask, one will be given to you upon arrival. For doctor visits, patients may have 1 support person aged 18 or older with them. For treatment visits, patients cannot have anyone with them due to current Covid guidelines and our immunocompromised population.   

## 2022-06-08 NOTE — Progress Notes (Signed)
Ok to treat today with heart rate of 48 and Scr of 1.85.

## 2022-06-27 ENCOUNTER — Other Ambulatory Visit: Payer: Self-pay

## 2022-06-30 ENCOUNTER — Ambulatory Visit (HOSPITAL_COMMUNITY)
Admission: RE | Admit: 2022-06-30 | Discharge: 2022-06-30 | Disposition: A | Discharge status: Home / Self Care | Payer: PPO | Source: Ambulatory Visit | Attending: Physician Assistant | Admitting: Physician Assistant

## 2022-06-30 DIAGNOSIS — C3492 Malignant neoplasm of unspecified part of left bronchus or lung: Secondary | ICD-10-CM

## 2022-06-30 NOTE — Progress Notes (Signed)
[facility] OFFICE PROGRESS NOTE  [name], MD [address] [place] [address]  DIAGNOSIS: Recurrent non-small cell lung cancer initially diagnosed as stage IA (T1b, N0, M0) non-small cell lung cancer, adenocarcinoma presented with left upper lobe lung nodule diagnosed in January 2020.  The patient has evidence for disease recurrence in AP window lymphadenopathy in December 2022.   There was insufficient material for the molecular studies by [facility].  PRIOR THERAPY: ) Status post left lingulectomy with lymph node dissection under the care of Dr. Servando Snare on January 01, 2019 2) Concurrent chemoradiation with carboplatin for an AUC of 2 and paclitaxel 45 mg per metered squared weekly.  First dose on 01/24/2022.  Status post 7 cycles. Last dose was given March 14, 2022  CURRENT THERAPY: Consolidation treatment with immunotherapy with Imfinzi 1500 Mg IV every 4 weeks.  First dose Apr 12, 2022.  Status post 3 cycles.  INTERVAL HISTORY: Jack Taylor 76 y.o. male returns to the clinic today for a follow-up visit accompanied by his wife.  The patient is feeling well currently well today without any concerning complaints.  The patient is currently undergoing immunotherapy with Imfinzi and is tolerating well without any concerning adverse side effects.  He denies any recent fever, chills, night sweats, or  weight loss.  Denies any chest pain, cough, or hemoptysis.  He reports he has baseline dyspnea on exertion but overall his breathing has greatly improved since starting treatment.  Overall his dyspnea on exertion has improved greatly compared to prior.  The patient is pleased that he is now able to walk to the mailbox with getting short of breath as he was previously.  He denies any nausea, vomiting, constipation, or diarrhea. He denies any headache or visual changes.  Denies any rashes or skin changes.  The patient recently had a restaging CT scan performed.   He is here today for evaluation to review his scan results before starting cycle #4.     MEDICAL HISTORY: Past Medical History:  Diagnosis Date   Adenomatous colon polyp    Anxiety    Blood transfusion without reported diagnosis    Cancer ([facility])    Lung; 11/2018   CKD (chronic kidney disease)    stage III 11/09/18   COPD (chronic obstructive pulmonary disease) (HCC)    Gastric ulcer    GERD (gastroesophageal reflux disease)    High cholesterol    History of radiation therapy    Left Lung- 02/17/22-03/18/22-Dr. Gery Pray   Hypertension    Pre-diabetes    A1c 6.2% 11/09/18    ALLERGIES:  has No Known Allergies.  MEDICATIONS:  Current Outpatient Medications  Medication Sig Dispense Refill   albuterol (PROVENTIL HFA;VENTOLIN HFA) 108 (90 Base) MCG/ACT inhaler Inhale 2 puffs into the lungs every 6 (six) hours as needed for wheezing or shortness of breath. Reported on 06/02/2016     amLODipine (NORVASC) 10 MG tablet Take 10 mg by mouth daily.      ANORO ELLIPTA 62.5-25 MCG/ACT AEPB Inhale 1 puff into the lungs daily.     esomeprazole (NEXIUM) 40 MG capsule Take 40 mg by mouth daily.      losartan (COZAAR) 50 MG tablet Take 50 mg by mouth daily.      prochlorperazine (COMPAZINE) 10 MG tablet Take 1 tablet (10 mg total) by mouth every 6 (six) hours as needed. 30 tablet 2   rosuvastatin (CRESTOR) 10 MG tablet Take 10 mg by mouth  daily.     sucralfate (CARAFATE) 1 g tablet Take 1 tablet (1 g total) by mouth 4 (four) times daily -  with meals and at bedtime. Crush and dissolve in 10 mL of warm water prior to swallowing 120 tablet 1   diazepam (VALIUM) 2 MG tablet Take 2 mg by mouth at bedtime as needed.     traZODone (DESYREL) 50 MG tablet Take 50 mg by mouth at bedtime.     No current facility-administered medications for this visit.    SURGICAL HISTORY:  Past Surgical History:  Procedure Laterality Date   BRONCHIAL NEEDLE ASPIRATION BIOPSY  01/10/2022   Procedure: BRONCHIAL  NEEDLE ASPIRATION BIOPSIES;  Surgeon: Collene Gobble, MD;  Location: [facility] ENDOSCOPY;  Service: Cardiopulmonary;;   COLONOSCOPY  2008   ESOPHAGOGASTRODUODENOSCOPY  11/23/2011   Procedure: ESOPHAGOGASTRODUODENOSCOPY (EGD);  Surgeon: Owens Loffler, MD;  Location: [facility] ENDOSCOPY;  Service: Endoscopy;  Laterality: N/A;   INTERCOSTAL NERVE BLOCK Left 01/01/2019   Procedure: INTERCOSTAL NERVE BLOCK;  Surgeon: Grace Isaac, MD;  Location: [facility];  Service: Thoracic;  Laterality: Left;   LUNG BIOPSY  12/18/2018   Procedure: LUNG BIOPSY - of Left Upper Lobe Biopsy of #7 Node Biopsy of #10L Node;  Surgeon: Grace Isaac, MD;  Location: [facility];  Service: Thoracic;;   LYMPH NODE DISSECTION Left 01/01/2019   Procedure: LYMPH NODE DISSECTION;  Surgeon: Grace Isaac, MD;  Location: [facility];  Service: Thoracic;  Laterality: Left;   UPPER GASTROINTESTINAL ENDOSCOPY  11/23/11   VIDEO ASSISTED THORACOSCOPY (VATS)/WEDGE RESECTION Left 01/01/2019   Procedure: VIDEO ASSISTED THORACOSCOPY (VATS)/ LEFT LINGULECTOMY;  Surgeon: Grace Isaac, MD;  Location: [facility];  Service: Thoracic;  Laterality: Left;   VIDEO BRONCHOSCOPY WITH ENDOBRONCHIAL NAVIGATION N/A 12/18/2018   Procedure: VIDEO BRONCHOSCOPY WITH ENDOBRONCHIAL NAVIGATION with Placement of Fiducial Markers x 3;  Surgeon: Grace Isaac, MD;  Location: [facility];  Service: Thoracic;  Laterality: N/A;   VIDEO BRONCHOSCOPY WITH ENDOBRONCHIAL ULTRASOUND N/A 12/18/2018   Procedure: VIDEO BRONCHOSCOPY WITH ENDOBRONCHIAL ULTRASOUND;  Surgeon: Grace Isaac, MD;  Location: [facility];  Service: Thoracic;  Laterality: N/A;   VIDEO BRONCHOSCOPY WITH ENDOBRONCHIAL ULTRASOUND Left 01/10/2022   Procedure: VIDEO BRONCHOSCOPY WITH ENDOBRONCHIAL ULTRASOUND;  Surgeon: Collene Gobble, MD;  Location: [facility];  Service: Cardiopulmonary;  Laterality: Left;    REVIEW OF SYSTEMS:   Review of Systems  Constitutional: Negative for appetite change, chills, fatigue, fever and  unexpected weight change.  HENT: Negative for mouth sores, nosebleeds, sore throat and trouble swallowing.   Eyes: Negative for eye problems and icterus.  Respiratory: Positive for improved but persistent dyspnea on exertion with certain activities.  Negative for cough, hemoptysis, and wheezing.   Cardiovascular: Negative for chest pain and leg swelling.  Gastrointestinal:  Negative for abdominal pain, diarrhea, constipation, nausea and vomiting.  Genitourinary: Negative for bladder incontinence, difficulty urinating, dysuria, frequency and hematuria.   Musculoskeletal: Negative for back pain, gait problem, neck pain and neck stiffness.  Skin: Negative for itching and rash.  Neurological: Negative for dizziness, extremity weakness, gait problem, headaches, light-headedness and seizures.  Hematological: Negative for adenopathy. Does not bruise/bleed easily.  Psychiatric/Behavioral: Negative for confusion, depression and sleep disturbance. The patient is not nervous/anxious.     PHYSICAL EXAMINATION:  Blood pressure 129/60, pulse (!) 50, temperature 97.7 F (36.5 C), resp. rate 18, weight 173 lb 4.8 oz (78.6 kg), SpO2 95 %.  ECOG PERFORMANCE STATUS: 1  Physical Exam  Constitutional: Oriented  to person, place, and time and well-developed, well-nourished, and in no distress.  HENT:  Head: Normocephalic and atraumatic.  Hard of hearing Mouth/Throat: Oropharynx is clear and moist. No oropharyngeal exudate.  Eyes: Conjunctivae are normal. Right eye exhibits no discharge. Left eye exhibits no discharge. No scleral icterus.  Neck: Normal range of motion. Neck supple.  Cardiovascular: Bradycardic, regular rhythm, normal heart sounds and intact distal pulses.   Pulmonary/Chest: Effort normal and breath sounds normal. No respiratory distress. No wheezes. No rales.  Abdominal: Soft. Bowel sounds are normal. Exhibits no distension and no mass. There is no tenderness.  Musculoskeletal: Normal range of  motion. Exhibits no edema.  Lymphadenopathy:    No cervical adenopathy.  Neurological: Alert and oriented to person, place, and time. Exhibits normal muscle tone. Gait normal. Coordination normal.  Skin: Skin is warm and dry. No rash noted. Not diaphoretic. No erythema. No pallor.  Psychiatric: Mood, memory and judgment normal.  Vitals reviewed.  LABORATORY DATA: Lab Results  Component Value Date   WBC 3.7 (L) 07/05/2022   HGB 13.9 07/05/2022   HCT 39.4 07/05/2022   MCV 96.3 07/05/2022   PLT 127 (L) 07/05/2022      Chemistry      Component Value Date/Time   NA 139 07/05/2022 1015   K 3.8 07/05/2022 1015   CL 107 07/05/2022 1015   CO2 26 07/05/2022 1015   BUN 13 07/05/2022 1015   CREATININE 1.75 (H) 07/05/2022 1015      Component Value Date/Time   CALCIUM 9.1 07/05/2022 1015   ALKPHOS 59 07/05/2022 1015   AST 14 (L) 07/05/2022 1015   ALT 13 07/05/2022 1015   BILITOT 0.4 07/05/2022 1015       RADIOGRAPHIC STUDIES:  CT Chest Wo Contrast  Result Date: 06/30/2022 CLINICAL DATA:  Non-small cell lung cancer, status post lingular resection, assess treatment response, chemotherapy and XRT complete, ongoing immunotherapy * Tracking Code: BO * EXAM: CT CHEST WITHOUT CONTRAST TECHNIQUE: Multidetector CT imaging of the chest was performed following the standard protocol without IV contrast. RADIATION DOSE REDUCTION: This exam was performed according to the departmental dose-optimization program which includes automated exposure control, adjustment of the mA and/or kV according to patient size and/or use of iterative reconstruction technique. COMPARISON:  CT chest, 03/31/2022 PET-CT, 12/02/2021, 05/30/2019 FINDINGS: Cardiovascular: Aortic atherosclerosis. Normal heart size. Three-vessel coronary artery calcifications. No pericardial effusion. Mediastinum/Nodes: Continued decrease in size of a previously FDG avid AP window lymph node, measuring 1.0 x 0.9 cm, previously 1.3 x 1.1 cm (series  2, image 54). No persistently enlarged mediastinal, hilar, or axillary lymph nodes. Thyroid gland, trachea, and esophagus demonstrate no significant findings. Lungs/Pleura: Status post lingular resection. Unchanged small ground-glass nodules in the right pulmonary apex measuring up to 0.5 cm (series 5, image 25, 27). Unchanged irregular 0.5 cm nodule of the posterior right upper lobe (series 5, image 51). Background of very fine centrilobular pulmonary nodules most concentrated in the lung apices. Mild, bandlike scarring of the bilateral lung bases. No pleural effusion or pneumothorax. Upper Abdomen: No acute abnormality. Musculoskeletal: No chest wall abnormality. No suspicious osseous lesions identified. IMPRESSION: 1. Status post lingular resection. 2. Continued decrease in size of a previously FDG avid AP window lymph node, consistent with treatment response. No persistently enlarged mediastinal, hilar, or axillary lymph nodes. 3. Unchanged small nodules of the right upper lobe measuring no greater than 0.5 cm, almost certainly benign and incidental. Attention on follow-up. 4. Background of very fine centrilobular pulmonary  nodules, most concentrated in the lung apices, and diffuse bilateral bronchial wall thickening, consistent with smoking-related respiratory bronchiolitis. 5. Coronary artery disease. Aortic Atherosclerosis (ICD10-I70.0). Electronically Signed   By: Delanna Ahmadi M.D.   On: 06/30/2022 16:24     ASSESSMENT/PLAN:  This is a very pleasant 76 year old Caucasian male diagnosed with recurrent non-small cell lung cancer that was initially diagnosed as a stage Ia (T1b, N0, M0) non-small cell lung cancer, adenocarcinoma.  He initially presented with a left upper lobe lung nodule in January 2020.  He had evidence of disease recurrence with an AP window lymphadenopathy in December 2022.  He is status post left lingulectomy with lymph node dissection under the care of Dr. Servando Snare on 01/01/2019.   This was biopsy-proven to be disease recurrent adenocarcinoma lung.  He then underwent a course of concurrent chemoradiation with carboplatin for an AUC 2 and paclitaxel 45 mg per metered squared.  He is status post 7 cycles.  His last dose was given on 03/14/2022.  The patient had a partial response to treatment.   He is currently undergoing consolidation immunotherapy with Imfinzi for 200 mg IV every 4 weeks.  He status post 3 cycles and is tolerated well without any concerning adverse side effects.  The patient recently had a restaging CT scan performed.  Dr. Julien Nordmann personally and independently reviewed the scan discussed results with the patient today.  The scan showed a positive response to treatment with continued decrease in size of his previously FDG avid lymph nodes.   Labs were reviewed.  Recommend that he proceed with cycle number 4 today as scheduled.  The patient has CKD and his creatinine is 1.75 today.  Okay to proceed with treatment with his creatinine today.   We will see him back for follow-up visit in 4 weeks for evaluation and repeat blood work before undergoing cycle #5.  His wife inquired if he is able to undergo routine health maintenance as recommended by his PCP. Namely, he is due for his pneumonia vaccine and colonoscopy. Discussed he can perform his routine health maintenance if he is due.   The patient was advised to call immediately if he has any concerning symptoms in the interval. The patient voices understanding of current disease status and treatment options and is in agreement with the current care plan. All questions were answered. The patient knows to call the clinic with any problems, questions or concerns. We can certainly see the patient much sooner if necessary    No orders of the defined types were placed in this encounter.     Elianne Gubser L Bryker Fletchall, PA-C 07/05/22  ADDENDUM: Hematology/Oncology Attending: I had a face-to-face encounter with  the patient today.  I reviewed his record, lab, scan and recommended his care plan.  This is a very pleasant 76 years old white male with recurrent non-small cell lung cancer that was initially diagnosed as a stage Ia adenocarcinoma in January 2020 with disease recurrence and evidence of AP window lymphadenopathy in December 2022.  The patient is status post a course of concurrent chemoradiation with weekly carboplatin and paclitaxel for 7 cycles with partial response. He is currently undergoing consolidation treatment with immunotherapy with Imfinzi 1500 Mg IV every 4 weeks status post 3 cycles.  The patient has been tolerating this treatment well with no concerning adverse effects. He had repeat CT scan of the chest performed recently.  I personally and independently reviewed the scan and discussed the result with the patient and his wife.  His scan showed further improvement of his disease. I recommended for him to continue his current treatment with consolidation immunotherapy with Imfinzi and he will proceed with cycle #4 today. I will see him back for follow-up visit in 4 weeks for evaluation before the next cycle of his treatment. The patient was advised to call immediately if he has any other concerning symptoms in the interval. The total time spent in the appointment was 30 minutes. Disclaimer: This note was dictated with voice recognition software. Similar sounding words can inadvertently be transcribed and may be missed upon review. Eilleen Kempf, MD

## 2022-07-04 ENCOUNTER — Other Ambulatory Visit: Payer: Self-pay

## 2022-07-05 ENCOUNTER — Inpatient Hospital Stay: Payer: PPO

## 2022-07-05 ENCOUNTER — Other Ambulatory Visit: Payer: Self-pay

## 2022-07-05 ENCOUNTER — Inpatient Hospital Stay: Payer: PPO | Attending: Internal Medicine

## 2022-07-05 ENCOUNTER — Encounter: Payer: Self-pay | Admitting: Physician Assistant

## 2022-07-05 ENCOUNTER — Inpatient Hospital Stay (HOSPITAL_BASED_OUTPATIENT_CLINIC_OR_DEPARTMENT_OTHER): Payer: PPO | Admitting: Physician Assistant

## 2022-07-05 VITALS — BP 129/60 | HR 50 | Temp 97.7°F | Resp 18 | Wt 173.3 lb

## 2022-07-05 VITALS — BP 111/70 | HR 64 | Temp 97.9°F | Resp 18

## 2022-07-05 DIAGNOSIS — C3492 Malignant neoplasm of unspecified part of left bronchus or lung: Secondary | ICD-10-CM

## 2022-07-05 DIAGNOSIS — R0609 Other forms of dyspnea: Secondary | ICD-10-CM | POA: Insufficient documentation

## 2022-07-05 DIAGNOSIS — Z5112 Encounter for antineoplastic immunotherapy: Secondary | ICD-10-CM

## 2022-07-05 DIAGNOSIS — I700 Atherosclerosis of aorta: Secondary | ICD-10-CM | POA: Diagnosis not present

## 2022-07-05 DIAGNOSIS — C3412 Malignant neoplasm of upper lobe, left bronchus or lung: Secondary | ICD-10-CM | POA: Insufficient documentation

## 2022-07-05 DIAGNOSIS — Z79899 Other long term (current) drug therapy: Secondary | ICD-10-CM | POA: Insufficient documentation

## 2022-07-05 DIAGNOSIS — J449 Chronic obstructive pulmonary disease, unspecified: Secondary | ICD-10-CM | POA: Diagnosis not present

## 2022-07-05 DIAGNOSIS — Z923 Personal history of irradiation: Secondary | ICD-10-CM | POA: Insufficient documentation

## 2022-07-05 DIAGNOSIS — I2510 Atherosclerotic heart disease of native coronary artery without angina pectoris: Secondary | ICD-10-CM | POA: Insufficient documentation

## 2022-07-05 LAB — CBC WITH DIFFERENTIAL (CANCER CENTER ONLY)
Abs Immature Granulocytes: 0.02 10*3/uL (ref 0.00–0.07)
Basophils Absolute: 0 10*3/uL (ref 0.0–0.1)
Basophils Relative: 1 %
Eosinophils Absolute: 0.1 10*3/uL (ref 0.0–0.5)
Eosinophils Relative: 4 %
HCT: 39.4 % (ref 39.0–52.0)
Hemoglobin: 13.9 g/dL (ref 13.0–17.0)
Immature Granulocytes: 1 %
Lymphocytes Relative: 24 %
Lymphs Abs: 0.9 10*3/uL (ref 0.7–4.0)
MCH: 34 pg (ref 26.0–34.0)
MCHC: 35.3 g/dL (ref 30.0–36.0)
MCV: 96.3 fL (ref 80.0–100.0)
Monocytes Absolute: 0.4 10*3/uL (ref 0.1–1.0)
Monocytes Relative: 12 %
Neutro Abs: 2.2 10*3/uL (ref 1.7–7.7)
Neutrophils Relative %: 58 %
Platelet Count: 127 10*3/uL — ABNORMAL LOW (ref 150–400)
RBC: 4.09 MIL/uL — ABNORMAL LOW (ref 4.22–5.81)
RDW: 11.4 % — ABNORMAL LOW (ref 11.5–15.5)
WBC Count: 3.7 10*3/uL — ABNORMAL LOW (ref 4.0–10.5)
nRBC: 0 % (ref 0.0–0.2)

## 2022-07-05 LAB — CMP (CANCER CENTER ONLY)
ALT: 13 U/L (ref 0–44)
AST: 14 U/L — ABNORMAL LOW (ref 15–41)
Albumin: 4 g/dL (ref 3.5–5.0)
Alkaline Phosphatase: 59 U/L (ref 38–126)
Anion gap: 6 (ref 5–15)
BUN: 13 mg/dL (ref 8–23)
CO2: 26 mmol/L (ref 22–32)
Calcium: 9.1 mg/dL (ref 8.9–10.3)
Chloride: 107 mmol/L (ref 98–111)
Creatinine: 1.75 mg/dL — ABNORMAL HIGH (ref 0.61–1.24)
GFR, Estimated: 40 mL/min — ABNORMAL LOW (ref >60)
Glucose, Bld: 132 mg/dL — ABNORMAL HIGH (ref 70–99)
Potassium: 3.8 mmol/L (ref 3.5–5.1)
Sodium: 139 mmol/L (ref 135–145)
Total Bilirubin: 0.4 mg/dL (ref 0.3–1.2)
Total Protein: 6.9 g/dL (ref 6.5–8.1)

## 2022-07-05 LAB — TSH: TSH: 4.508 u[IU]/mL — ABNORMAL HIGH (ref 0.350–4.500)

## 2022-07-05 MED ORDER — SODIUM CHLORIDE 0.9 % IV SOLN: Freq: Once | INTRAVENOUS | Status: AC

## 2022-07-05 MED ORDER — SODIUM CHLORIDE 0.9 % IV SOLN
1500.0000 mg | Freq: Once | INTRAVENOUS | Status: AC
  Administered 2022-07-05: 1500 mg via INTRAVENOUS
  Filled 2022-07-05: qty 30

## 2022-07-05 NOTE — Patient Instructions (Signed)
[facility]   Discharge Instructions: Thank you for choosing [facility] to provide your oncology and hematology care.   If you have a lab appointment with the Cancer Center, please go directly to the Cancer Center and check in at the registration area.   Wear comfortable clothing and clothing appropriate for easy access to any Portacath or PICC line.   We strive to give you quality time with your provider. You may need to reschedule your appointment if you arrive late (15 or more minutes).  Arriving late affects you and other patients whose appointments are after yours.  Also, if you miss three or more appointments without notifying the office, you may be dismissed from the clinic at the provider's discretion.      For prescription refill requests, have your pharmacy contact our office and allow 72 hours for refills to be completed.    Today you received the following chemotherapy and/or immunotherapy agents: Durvalumab (Imfinzi).      To help prevent nausea and vomiting after your treatment, we encourage you to take your nausea medication as directed.  BELOW ARE SYMPTOMS THAT SHOULD BE REPORTED IMMEDIATELY: *FEVER GREATER THAN 100.4 F (38 C) OR HIGHER *CHILLS OR SWEATING *NAUSEA AND VOMITING THAT IS NOT CONTROLLED WITH YOUR NAUSEA MEDICATION *UNUSUAL SHORTNESS OF BREATH *UNUSUAL BRUISING OR BLEEDING *URINARY PROBLEMS (pain or burning when urinating, or frequent urination) *BOWEL PROBLEMS (unusual diarrhea, constipation, pain near the anus) TENDERNESS IN MOUTH AND THROAT WITH OR WITHOUT PRESENCE OF ULCERS (sore throat, sores in mouth, or a toothache) UNUSUAL RASH, SWELLING OR PAIN  UNUSUAL VAGINAL DISCHARGE OR ITCHING   Items with * indicate a potential emergency and should be followed up as soon as possible or go to the Emergency Department if any problems should occur.  Please show the CHEMOTHERAPY ALERT CARD or IMMUNOTHERAPY ALERT CARD  at check-in to the Emergency Department and triage nurse.  Should you have questions after your visit or need to cancel or reschedule your appointment, please contact [facility]  Dept: [phone]  and follow the prompts.  Office hours are 8:00 a.m. to 4:30 p.m. Monday - Friday. Please note that voicemails left after 4:00 p.m. may not be returned until the following business day.  We are closed weekends and major holidays. You have access to a nurse at all times for urgent questions. Please call the main number to the clinic Dept: [phone] and follow the prompts.   For any non-urgent questions, you may also contact your provider using MyChart. We now offer e-Visits for anyone 18 and older to request care online for non-urgent symptoms. For details visit [email or web address].   Also download the MyChart app! Go to the app store, search "MyChart", open the app, select [facility], and log in with your MyChart username and password.  Due to Covid, a mask is required upon entering the hospital/clinic. If you do not have a mask, one will be given to you upon arrival. For doctor visits, patients may have 1 support person aged 18 or older with them. For treatment visits, patients cannot have anyone with them due to current Covid guidelines and our immunocompromised population.   

## 2022-07-05 NOTE — Progress Notes (Signed)
Per Cassie, PA  The patient has CKD and his creatinine is 1.75 today.  Okay to proceed with his creatinine today.

## 2022-07-06 ENCOUNTER — Other Ambulatory Visit: Payer: Self-pay

## 2022-07-08 ENCOUNTER — Other Ambulatory Visit: Payer: Self-pay

## 2022-07-09 ENCOUNTER — Other Ambulatory Visit: Payer: Self-pay

## 2022-08-02 ENCOUNTER — Inpatient Hospital Stay (HOSPITAL_BASED_OUTPATIENT_CLINIC_OR_DEPARTMENT_OTHER): Payer: PPO | Admitting: Internal Medicine

## 2022-08-02 ENCOUNTER — Inpatient Hospital Stay: Payer: PPO

## 2022-08-02 ENCOUNTER — Other Ambulatory Visit: Payer: Self-pay

## 2022-08-02 ENCOUNTER — Other Ambulatory Visit: Payer: Self-pay | Admitting: Internal Medicine

## 2022-08-02 VITALS — BP 123/62 | HR 50 | Temp 97.8°F | Wt 172.4 lb

## 2022-08-02 DIAGNOSIS — C3492 Malignant neoplasm of unspecified part of left bronchus or lung: Secondary | ICD-10-CM

## 2022-08-02 DIAGNOSIS — Z5112 Encounter for antineoplastic immunotherapy: Secondary | ICD-10-CM

## 2022-08-02 LAB — CBC WITH DIFFERENTIAL (CANCER CENTER ONLY)
Abs Immature Granulocytes: 0.01 10*3/uL (ref 0.00–0.07)
Basophils Absolute: 0 10*3/uL (ref 0.0–0.1)
Basophils Relative: 1 %
Eosinophils Absolute: 0.1 10*3/uL (ref 0.0–0.5)
Eosinophils Relative: 3 %
HCT: 40 % (ref 39.0–52.0)
Hemoglobin: 14.2 g/dL (ref 13.0–17.0)
Immature Granulocytes: 0 %
Lymphocytes Relative: 26 %
Lymphs Abs: 0.9 10*3/uL (ref 0.7–4.0)
MCH: 33.2 pg (ref 26.0–34.0)
MCHC: 35.5 g/dL (ref 30.0–36.0)
MCV: 93.5 fL (ref 80.0–100.0)
Monocytes Absolute: 0.5 10*3/uL (ref 0.1–1.0)
Monocytes Relative: 14 %
Neutro Abs: 2.1 10*3/uL (ref 1.7–7.7)
Neutrophils Relative %: 56 %
Platelet Count: 132 10*3/uL — ABNORMAL LOW (ref 150–400)
RBC: 4.28 MIL/uL (ref 4.22–5.81)
RDW: 11.7 % (ref 11.5–15.5)
WBC Count: 3.6 10*3/uL — ABNORMAL LOW (ref 4.0–10.5)
nRBC: 0 % (ref 0.0–0.2)

## 2022-08-02 LAB — CMP (CANCER CENTER ONLY)
ALT: 12 U/L (ref 0–44)
AST: 14 U/L — ABNORMAL LOW (ref 15–41)
Albumin: 4.3 g/dL (ref 3.5–5.0)
Alkaline Phosphatase: 62 U/L (ref 38–126)
Anion gap: 4 — ABNORMAL LOW (ref 5–15)
BUN: 15 mg/dL (ref 8–23)
CO2: 27 mmol/L (ref 22–32)
Calcium: 9.3 mg/dL (ref 8.9–10.3)
Chloride: 107 mmol/L (ref 98–111)
Creatinine: 1.92 mg/dL — ABNORMAL HIGH (ref 0.61–1.24)
GFR, Estimated: 36 mL/min — ABNORMAL LOW (ref >60)
Glucose, Bld: 101 mg/dL — ABNORMAL HIGH (ref 70–99)
Potassium: 4 mmol/L (ref 3.5–5.1)
Sodium: 138 mmol/L (ref 135–145)
Total Bilirubin: 0.6 mg/dL (ref 0.3–1.2)
Total Protein: 6.9 g/dL (ref 6.5–8.1)

## 2022-08-02 LAB — TSH: TSH: 10.39 u[IU]/mL — ABNORMAL HIGH (ref 0.350–4.500)

## 2022-08-02 MED ORDER — SODIUM CHLORIDE 0.9 % IV SOLN: Freq: Once | INTRAVENOUS | Status: AC

## 2022-08-02 MED ORDER — SODIUM CHLORIDE 0.9 % IV SOLN
1500.0000 mg | Freq: Once | INTRAVENOUS | Status: AC
  Administered 2022-08-02: 1500 mg via INTRAVENOUS
  Filled 2022-08-02: qty 30

## 2022-08-02 MED ORDER — LEVOTHYROXINE SODIUM 50 MCG PO TABS: 50.0000 ug | ORAL_TABLET | Freq: Every day | ORAL | 2 refills | Status: DC

## 2022-08-02 NOTE — Patient Instructions (Signed)
[facility]   Discharge Instructions: Thank you for choosing [facility] to provide your oncology and hematology care.   If you have a lab appointment with the Cancer Center, please go directly to the Cancer Center and check in at the registration area.   Wear comfortable clothing and clothing appropriate for easy access to any Portacath or PICC line.   We strive to give you quality time with your provider. You may need to reschedule your appointment if you arrive late (15 or more minutes).  Arriving late affects you and other patients whose appointments are after yours.  Also, if you miss three or more appointments without notifying the office, you may be dismissed from the clinic at the provider's discretion.      For prescription refill requests, have your pharmacy contact our office and allow 72 hours for refills to be completed.    Today you received the following chemotherapy and/or immunotherapy agents: Durvalumab (Imfinzi).      To help prevent nausea and vomiting after your treatment, we encourage you to take your nausea medication as directed.  BELOW ARE SYMPTOMS THAT SHOULD BE REPORTED IMMEDIATELY: *FEVER GREATER THAN 100.4 F (38 C) OR HIGHER *CHILLS OR SWEATING *NAUSEA AND VOMITING THAT IS NOT CONTROLLED WITH YOUR NAUSEA MEDICATION *UNUSUAL SHORTNESS OF BREATH *UNUSUAL BRUISING OR BLEEDING *URINARY PROBLEMS (pain or burning when urinating, or frequent urination) *BOWEL PROBLEMS (unusual diarrhea, constipation, pain near the anus) TENDERNESS IN MOUTH AND THROAT WITH OR WITHOUT PRESENCE OF ULCERS (sore throat, sores in mouth, or a toothache) UNUSUAL RASH, SWELLING OR PAIN  UNUSUAL VAGINAL DISCHARGE OR ITCHING   Items with * indicate a potential emergency and should be followed up as soon as possible or go to the Emergency Department if any problems should occur.  Please show the CHEMOTHERAPY ALERT CARD or IMMUNOTHERAPY ALERT CARD  at check-in to the Emergency Department and triage nurse.  Should you have questions after your visit or need to cancel or reschedule your appointment, please contact [facility]  Dept: [phone]  and follow the prompts.  Office hours are 8:00 a.m. to 4:30 p.m. Monday - Friday. Please note that voicemails left after 4:00 p.m. may not be returned until the following business day.  We are closed weekends and major holidays. You have access to a nurse at all times for urgent questions. Please call the main number to the clinic Dept: [phone] and follow the prompts.   For any non-urgent questions, you may also contact your provider using MyChart. We now offer e-Visits for anyone 18 and older to request care online for non-urgent symptoms. For details visit [email or web address].   Also download the MyChart app! Go to the app store, search "MyChart", open the app, select [facility], and log in with your MyChart username and password.  Due to Covid, a mask is required upon entering the hospital/clinic. If you do not have a mask, one will be given to you upon arrival. For doctor visits, patients may have 1 support person aged 18 or older with them. For treatment visits, patients cannot have anyone with them due to current Covid guidelines and our immunocompromised population.   

## 2022-08-02 NOTE — Progress Notes (Signed)
OK to treat today D1?C5 Imfinzi  w/ Creatinine 1.92 per Cassie PA. Pt encouraged to increase PO hydration

## 2022-08-02 NOTE — Progress Notes (Signed)
[address] Telephone:([phone]   Fax:([phone]  OFFICE PROGRESS NOTE  [name], MD [address] [place] [address]  DIAGNOSIS: Recurrent non-small cell lung cancer initially diagnosed as stage IA (T1b, N0, M0) non-small cell lung cancer, adenocarcinoma presented with left upper lobe lung nodule diagnosed in January 2020.  The patient has evidence for disease recurrence in AP window lymphadenopathy in December 2022.   There was insufficient material for the molecular studies by [facility].   PRIOR THERAPY:  1) Status post left lingulectomy with lymph node dissection under the care of Dr. Servando Snare on January 01, 2019. 2) Concurrent chemoradiation with carboplatin for an AUC of 2 and paclitaxel 45 mg per metered squared weekly.  First dose on 01/24/2022.  Status post 7 cycles. Last dose was given March 14, 2022   CURRENT THERAPY: Consolidation treatment with immunotherapy with Imfinzi 1500 Mg IV every 4 weeks.  First dose Apr 12, 2022.  Status post 4 cycles.  INTERVAL HISTORY: Jack Taylor 76 y.o. male returns to the clinic today for follow-up visit accompanied by his wife.  The patient is feeling fine today with no concerning complaints.  He has been tolerating his treatment with consolidation immunotherapy fairly well.  He denied having any current chest pain, shortness of breath, cough or hemoptysis.  He denied having any fever or chills.  He has no nausea, vomiting, diarrhea or constipation.  He has no headache or visual changes.  He denied having any recent weight loss or night sweats.  He is here today for evaluation before starting cycle #5 of his treatment.   MEDICAL HISTORY: Past Medical History:  Diagnosis Date   Adenomatous colon polyp    Anxiety    Blood transfusion without reported diagnosis    Cancer ([place])    Lung; 11/2018   CKD (chronic kidney disease)    stage III 11/09/18   COPD (chronic obstructive pulmonary  disease) (HCC)    Gastric ulcer    GERD (gastroesophageal reflux disease)    High cholesterol    History of radiation therapy    Left Lung- 02/17/22-03/18/22-Dr. Gery Pray   Hypertension    Pre-diabetes    A1c 6.2% 11/09/18    ALLERGIES:  has No Known Allergies.  MEDICATIONS:  Current Outpatient Medications  Medication Sig Dispense Refill   albuterol (PROVENTIL HFA;VENTOLIN HFA) 108 (90 Base) MCG/ACT inhaler Inhale 2 puffs into the lungs every 6 (six) hours as needed for wheezing or shortness of breath. Reported on 06/02/2016     amLODipine (NORVASC) 10 MG tablet Take 10 mg by mouth daily.      ANORO ELLIPTA 62.5-25 MCG/ACT AEPB Inhale 1 puff into the lungs daily.     diazepam (VALIUM) 2 MG tablet Take 2 mg by mouth at bedtime as needed.     esomeprazole (NEXIUM) 40 MG capsule Take 40 mg by mouth daily.      losartan (COZAAR) 50 MG tablet Take 50 mg by mouth daily.      prochlorperazine (COMPAZINE) 10 MG tablet Take 1 tablet (10 mg total) by mouth every 6 (six) hours as needed. 30 tablet 2   rosuvastatin (CRESTOR) 10 MG tablet Take 10 mg by mouth daily.     sucralfate (CARAFATE) 1 g tablet Take 1 tablet (1 g total) by mouth 4 (four) times daily -  with meals and at bedtime. Crush and dissolve in 10 mL of warm water prior to swallowing  120 tablet 1   traZODone (DESYREL) 50 MG tablet Take 50 mg by mouth at bedtime.     No current facility-administered medications for this visit.    SURGICAL HISTORY:  Past Surgical History:  Procedure Laterality Date   BRONCHIAL NEEDLE ASPIRATION BIOPSY  01/10/2022   Procedure: BRONCHIAL NEEDLE ASPIRATION BIOPSIES;  Surgeon: Collene Gobble, MD;  Location: [facility] ENDOSCOPY;  Service: Cardiopulmonary;;   COLONOSCOPY  2008   ESOPHAGOGASTRODUODENOSCOPY  11/23/2011   Procedure: ESOPHAGOGASTRODUODENOSCOPY (EGD);  Surgeon: Owens Loffler, MD;  Location: [facility] ENDOSCOPY;  Service: Endoscopy;  Laterality: N/A;   INTERCOSTAL NERVE BLOCK Left 01/01/2019   Procedure:  INTERCOSTAL NERVE BLOCK;  Surgeon: Grace Isaac, MD;  Location: [place];  Service: Thoracic;  Laterality: Left;   LUNG BIOPSY  12/18/2018   Procedure: LUNG BIOPSY - of Left Upper Lobe Biopsy of #7 Node Biopsy of #10L Node;  Surgeon: Grace Isaac, MD;  Location: [place];  Service: Thoracic;;   LYMPH NODE DISSECTION Left 01/01/2019   Procedure: LYMPH NODE DISSECTION;  Surgeon: Grace Isaac, MD;  Location: [place];  Service: Thoracic;  Laterality: Left;   UPPER GASTROINTESTINAL ENDOSCOPY  11/23/11   VIDEO ASSISTED THORACOSCOPY (VATS)/WEDGE RESECTION Left 01/01/2019   Procedure: VIDEO ASSISTED THORACOSCOPY (VATS)/ LEFT LINGULECTOMY;  Surgeon: Grace Isaac, MD;  Location: [place];  Service: Thoracic;  Laterality: Left;   VIDEO BRONCHOSCOPY WITH ENDOBRONCHIAL NAVIGATION N/A 12/18/2018   Procedure: VIDEO BRONCHOSCOPY WITH ENDOBRONCHIAL NAVIGATION with Placement of Fiducial Markers x 3;  Surgeon: Grace Isaac, MD;  Location: [place];  Service: Thoracic;  Laterality: N/A;   VIDEO BRONCHOSCOPY WITH ENDOBRONCHIAL ULTRASOUND N/A 12/18/2018   Procedure: VIDEO BRONCHOSCOPY WITH ENDOBRONCHIAL ULTRASOUND;  Surgeon: Grace Isaac, MD;  Location: [place] [facility];  Service: Thoracic;  Laterality: N/A;   VIDEO BRONCHOSCOPY WITH ENDOBRONCHIAL ULTRASOUND Left 01/10/2022   Procedure: VIDEO BRONCHOSCOPY WITH ENDOBRONCHIAL ULTRASOUND;  Surgeon: Collene Gobble, MD;  Location: [place];  Service: Cardiopulmonary;  Laterality: Left;    REVIEW OF SYSTEMS:  A comprehensive review of systems was negative.   PHYSICAL EXAMINATION: General appearance: alert, cooperative, and no distress Head: Normocephalic, without obvious abnormality, atraumatic Neck: no adenopathy, no JVD, supple, symmetrical, trachea midline, and thyroid not enlarged, symmetric, no tenderness/mass/nodules Lymph nodes: Cervical, supraclavicular, and axillary nodes normal. Resp: clear to auscultation bilaterally Back: symmetric, no curvature. ROM  normal. No CVA tenderness. Cardio: regular rate and rhythm, S1, S2 normal, no murmur, click, rub or gallop GI: soft, non-tender; bowel sounds normal; no masses,  no organomegaly Extremities: extremities normal, atraumatic, no cyanosis or edema  ECOG PERFORMANCE STATUS: 1 - Symptomatic but completely ambulatory  Blood pressure 123/62, pulse (!) 50, temperature 97.8 F (36.6 C), weight 172 lb 6.4 oz (78.2 kg), SpO2 98 %.  LABORATORY DATA: Lab Results  Component Value Date   WBC 3.6 (L) 08/02/2022   HGB 14.2 08/02/2022   HCT 40.0 08/02/2022   MCV 93.5 08/02/2022   PLT 132 (L) 08/02/2022      Chemistry      Component Value Date/Time   NA 138 08/02/2022 0937   K 4.0 08/02/2022 0937   CL 107 08/02/2022 0937   CO2 27 08/02/2022 0937   BUN 15 08/02/2022 0937   CREATININE 1.92 (H) 08/02/2022 0937      Component Value Date/Time   CALCIUM 9.3 08/02/2022 0937   ALKPHOS 62 08/02/2022 0937   AST 14 (L) 08/02/2022 0937   ALT 12 08/02/2022 0937   BILITOT  0.6 08/02/2022 0937       RADIOGRAPHIC STUDIES: No results found.  ASSESSMENT AND PLAN: This is a very pleasant 76 years old white male diagnosed with recurrent non-small cell lung cancer that was initially diagnosed as stage IA (T1b, N0, M0) non-small cell lung cancer, adenocarcinoma presented with left upper lobe lung nodule in January 2020.  The patient has evidence for disease recurrence in AP window lymphadenopathy in December 2022.  He is status post left lingulectomy with lymph node dissection under the care of Dr. Servando Snare on January 01, 2019.  This was biopsy-proven to be recurrent adenocarcinoma of the lung. The patient underwent a course of concurrent chemoradiation with weekly carboplatin for AUC of 2 and paclitaxel 45 Mg/M2 status post 7 cycles.  Last dose was given March 14, 2022.  The patient has partial response to this treatment. The patient is currently undergoing consolidation treatment with immunotherapy with Imfinzi  1500 Mg IV every 4 weeks status post 4 cycles.   The patient has been tolerating this treatment well with no concerning adverse effects. I recommended for him to proceed with cycle #5 today as planned. I will see him back for follow-up visit in 4 weeks for evaluation before starting cycle #6.  He will have repeat imaging studies after the next cycle of his treatment. The patient was advised to call immediately if he has any other concerning symptoms in the interval. The patient voices understanding of current disease status and treatment options and is in agreement with the current care plan.  All questions were answered. The patient knows to call the clinic with any problems, questions or concerns. We can certainly see the patient much sooner if necessary.  Disclaimer: This note was dictated with voice recognition software. Similar sounding words can inadvertently be transcribed and may not be corrected upon review.

## 2022-08-03 ENCOUNTER — Telehealth: Payer: Self-pay

## 2022-08-03 NOTE — Telephone Encounter (Signed)
-----   Message from Curt Bears, MD sent at 08/02/2022  5:24 PM EDT ----- Please let the patient know that his TSH was elevated and they sent the prescription of levothyroxine to his pharmacy to be taken once daily.  Thank you ----- Message ----- From: Buel Ream, Lab In [place] Sent: 08/02/2022   9:46 AM EDT To: Curt Bears, MD

## 2022-08-03 NOTE — Telephone Encounter (Signed)
I have left a detailed message advising as indicated.

## 2022-08-04 ENCOUNTER — Telehealth: Payer: Self-pay | Admitting: Internal Medicine

## 2022-08-04 NOTE — Telephone Encounter (Signed)
Per 8/31 inbasket called and left message for pt about appointment

## 2022-08-05 ENCOUNTER — Other Ambulatory Visit: Payer: Self-pay

## 2022-08-17 ENCOUNTER — Other Ambulatory Visit: Payer: Self-pay

## 2022-08-18 ENCOUNTER — Other Ambulatory Visit: Payer: Self-pay

## 2022-08-21 ENCOUNTER — Other Ambulatory Visit: Payer: Self-pay

## 2022-08-27 ENCOUNTER — Other Ambulatory Visit: Payer: Self-pay | Admitting: Physician Assistant

## 2022-08-27 DIAGNOSIS — C3492 Malignant neoplasm of unspecified part of left bronchus or lung: Secondary | ICD-10-CM

## 2022-08-30 NOTE — Progress Notes (Unsigned)
[place] OFFICE PROGRESS NOTE  Jack Major, MD [address] [place] [address]  DIAGNOSIS: Recurrent non-small cell lung cancer initially diagnosed as stage IA (T1b, N0, M0) non-small cell lung cancer, adenocarcinoma presented with left upper lobe lung nodule diagnosed in January 2020.  The patient has evidence for disease recurrence in AP window lymphadenopathy in December 2022.   There was insufficient material for the molecular studies by [facility].  PRIOR THERAPY: 1) Status post left lingulectomy with lymph node dissection under the care of Dr. Servando Snare on January 01, 2019 2) Concurrent chemoradiation with carboplatin for an AUC of 2 and paclitaxel 45 mg per metered squared weekly.  First dose on 01/24/2022.  Status post 7 cycles. Last dose was given March 14, 2022  CURRENT THERAPY: Consolidation treatment with immunotherapy with Imfinzi 1500 Mg IV every 4 weeks.  First dose Apr 12, 2022.  Status post 5 cycles.  INTERVAL HISTORY: Jack Taylor 76 y.o. male returns to the clinic today for a follow-up visit accompanied by his wife.  The patient is feeling well currently well today without any concerning complaints.  The patient is currently undergoing immunotherapy with Imfinzi and is tolerating well without any concerning adverse side effects.  He denies any recent fever, chills, night sweats, or  weight loss.  Denies any chest pain, cough, or hemoptysis.  He reports he has baseline dyspnea on exertion.  Overall his dyspnea on exertion has improved greatly compared to prior. Denies cough, hemoptysis, or chest pain. He was started on synthroid at his last appointment which is is complaint with. He denies any nausea, vomiting, or diarrhea. If he has constipation, his wife will give him a stool softener. He denies any headache or visual changes.  Denies any rashes or skin changes.   He is here today for evaluation before starting cycle #6.        MEDICAL HISTORY: Past Medical History:  Diagnosis Date   Adenomatous colon polyp    Anxiety    Blood transfusion without reported diagnosis    Cancer ([place])    Lung; 11/2018   CKD (chronic kidney disease)    stage III 11/09/18   COPD (chronic obstructive pulmonary disease) (HCC)    Gastric ulcer    GERD (gastroesophageal reflux disease)    High cholesterol    History of radiation therapy    Left Lung- 02/17/22-03/18/22-Dr. Gery Pray   Hypertension    Pre-diabetes    A1c 6.2% 11/09/18    ALLERGIES:  has No Known Allergies.  MEDICATIONS:  Current Outpatient Medications  Medication Sig Dispense Refill   albuterol (PROVENTIL HFA;VENTOLIN HFA) 108 (90 Base) MCG/ACT inhaler Inhale 2 puffs into the lungs every 6 (six) hours as needed for wheezing or shortness of breath. Reported on 06/02/2016     amLODipine (NORVASC) 10 MG tablet Take 10 mg by mouth daily.      ANORO ELLIPTA 62.5-25 MCG/ACT AEPB Inhale 1 puff into the lungs daily.     esomeprazole (NEXIUM) 40 MG capsule Take 40 mg by mouth daily.      levothyroxine (SYNTHROID) 50 MCG tablet Take 1 tablet (50 mcg total) by mouth daily before breakfast. 30 tablet 2   losartan (COZAAR) 50 MG tablet Take 50 mg by mouth daily.      prochlorperazine (COMPAZINE) 10 MG tablet TAKE 1 TABLET BY MOUTH EVERY 6 HOURS AS NEEDED 30 tablet 0   rosuvastatin (CRESTOR) 10 MG tablet Take 10 mg by  mouth daily.     sucralfate (CARAFATE) 1 g tablet Take 1 tablet (1 g total) by mouth 4 (four) times daily -  with meals and at bedtime. Crush and dissolve in 10 mL of warm water prior to swallowing 120 tablet 1   traZODone (DESYREL) 50 MG tablet Take 50 mg by mouth at bedtime.     diazepam (VALIUM) 2 MG tablet Take by mouth. (Patient not taking: Reported on 7/89/3810)     folic acid (FOLVITE) 1 MG tablet Take by mouth. (Patient not taking: Reported on 08/31/2022)     No current facility-administered medications for this visit.    SURGICAL HISTORY:   Past Surgical History:  Procedure Laterality Date   BRONCHIAL NEEDLE ASPIRATION BIOPSY  01/10/2022   Procedure: BRONCHIAL NEEDLE ASPIRATION BIOPSIES;  Surgeon: Collene Gobble, MD;  Location: [facility] ENDOSCOPY;  Service: Cardiopulmonary;;   COLONOSCOPY  2008   ESOPHAGOGASTRODUODENOSCOPY  11/23/2011   Procedure: ESOPHAGOGASTRODUODENOSCOPY (EGD);  Surgeon: Owens Loffler, MD;  Location: [facility]OS[facility];  Service: Endoscopy;  Laterality: N/A;   INTERCOSTAL NERVE BLOCK Left 01/01/2019   Procedure: INTERCOSTAL NERVE BLOCK;  Surgeon: Grace Isaac, MD;  Location: [place];  Service: Thoracic;  Laterality: Left;   LUNG BIOPSY  12/18/2018   Procedure: LUNG BIOPSY - of Left Upper Lobe Biopsy of #7 Node Biopsy of #10L Node;  Surgeon: Grace Isaac, MD;  Location: [place];  Service: Thoracic;;   LYMPH NODE DISSECTION Left 01/01/2019   Procedure: LYMPH NODE DISSECTION;  Surgeon: Grace Isaac, MD;  Location: [place];  Service: Thoracic;  Laterality: Left;   UPPER GASTROINTESTINAL ENDOSCOPY  11/23/11   VIDEO ASSISTED THORACOSCOPY (VATS)/WEDGE RESECTION Left 01/01/2019   Procedure: VIDEO ASSISTED THORACOSCOPY (VATS)/ LEFT LINGULECTOMY;  Surgeon: Grace Isaac, MD;  Location: [place];  Service: Thoracic;  Laterality: Left;   VIDEO BRONCHOSCOPY WITH ENDOBRONCHIAL NAVIGATION N/A 12/18/2018   Procedure: VIDEO BRONCHOSCOPY WITH ENDOBRONCHIAL NAVIGATION with Placement of Fiducial Markers x 3;  Surgeon: Grace Isaac, MD;  Location: [place];  Service: Thoracic;  Laterality: N/A;   VIDEO BRONCHOSCOPY WITH ENDOBRONCHIAL ULTRASOUND N/A 12/18/2018   Procedure: VIDEO BRONCHOSCOPY WITH ENDOBRONCHIAL ULTRASOUND;  Surgeon: Grace Isaac, MD;  Location: [place];  Service: Thoracic;  Laterality: N/A;   VIDEO BRONCHOSCOPY WITH ENDOBRONCHIAL ULTRASOUND Left 01/10/2022   Procedure: VIDEO BRONCHOSCOPY WITH ENDOBRONCHIAL ULTRASOUND;  Surgeon: Collene Gobble, MD;  Location: [facility];  Service: Cardiopulmonary;  Laterality:  Left;    REVIEW OF SYSTEMS:   Review of Systems  Constitutional: Negative for appetite change, chills, fatigue, fever and unexpected weight change.  HENT: Negative for mouth sores, nosebleeds, sore throat and trouble swallowing.   Eyes: Negative for eye problems and icterus.  Respiratory: Positive for stable persistent dyspnea on exertion with certain activities.  Negative for cough, hemoptysis, and wheezing.   Cardiovascular: Negative for chest pain and leg swelling.  Gastrointestinal:  Negative for abdominal pain, diarrhea, constipation (none at this time), nausea and vomiting.  Genitourinary: Negative for bladder incontinence, difficulty urinating, dysuria, frequency and hematuria.   Musculoskeletal: Negative for back pain, gait problem, neck pain and neck stiffness.  Skin: Negative for itching and rash.  Neurological: Negative for dizziness, extremity weakness, gait problem, headaches, light-headedness and seizures.  Hematological: Negative for adenopathy. Does not bruise/bleed easily.  Psychiatric/Behavioral: Negative for confusion, depression and sleep disturbance. The patient is not nervous/anxious.     PHYSICAL EXAMINATION:  Blood pressure 136/74, pulse (!) 48, temperature 97.7 F (36.5 C), temperature  source Oral, resp. rate 16, height 5\' 8"  (1.727 m), weight 175 lb 11.2 oz (79.7 kg), SpO2 97 %.  ECOG PERFORMANCE STATUS: 1  Physical Exam  Constitutional: Oriented to person, place, and time and well-developed, well-nourished, and in no distress.  HENT:  Head: Normocephalic and atraumatic.  Hard of hearing Mouth/Throat: Oropharynx is clear and moist. No oropharyngeal exudate.  Eyes: Conjunctivae are normal. Right eye exhibits no discharge. Left eye exhibits no discharge. No scleral icterus.  Neck: Normal range of motion. Neck supple.  Cardiovascular: Bradycardic, regular rhythm, normal heart sounds and intact distal pulses.   Pulmonary/Chest: Effort normal and breath sounds  normal. No respiratory distress. No wheezes. No rales.  Abdominal: Soft. Bowel sounds are normal. Exhibits no distension and no mass. There is no tenderness.  Musculoskeletal: Normal range of motion. Exhibits no edema.  Lymphadenopathy:    No cervical adenopathy.  Neurological: Alert and oriented to person, place, and time. Exhibits normal muscle tone. Gait normal. Coordination normal.  Skin: Skin is warm and dry. No rash noted. Not diaphoretic. No erythema. No pallor.  Psychiatric: Mood, memory and judgment normal.  Vitals reviewed.    LABORATORY DATA: Lab Results  Component Value Date   WBC 4.6 08/31/2022   HGB 13.7 08/31/2022   HCT 38.8 (L) 08/31/2022   MCV 94.4 08/31/2022   PLT 142 (L) 08/31/2022      Chemistry      Component Value Date/Time   NA 142 08/31/2022 1002   K 4.3 08/31/2022 1002   CL 107 08/31/2022 1002   CO2 30 08/31/2022 1002   BUN 12 08/31/2022 1002   CREATININE 1.79 (H) 08/31/2022 1002      Component Value Date/Time   CALCIUM 9.2 08/31/2022 1002   ALKPHOS 72 08/31/2022 1002   AST 15 08/31/2022 1002   ALT 15 08/31/2022 1002   BILITOT 0.5 08/31/2022 1002       RADIOGRAPHIC STUDIES:  No results found.   ASSESSMENT/PLAN:  This is a very pleasant 76 year old Caucasian male diagnosed with recurrent non-small cell lung cancer that was initially diagnosed as a stage Ia (T1b, N0, M0) non-small cell lung cancer, adenocarcinoma.  He initially presented with a left upper lobe lung nodule in January 2020.  He had evidence of disease recurrence with an AP window lymphadenopathy in December 2022.  He is status post left lingulectomy with lymph node dissection under the care of Dr. Servando Snare on 01/01/2019.  This was biopsy-proven to be disease recurrent adenocarcinoma lung.  He then underwent a course of concurrent chemoradiation with carboplatin for an AUC 2 and paclitaxel 45 mg per metered squared.  He is status post 7 cycles.  His last dose was given on  03/14/2022.  The patient had a partial response to treatment.   He is currently undergoing consolidation immunotherapy with Imfinzi for 200 mg IV every 4 weeks.  He status post 5 cycles and is tolerated well without any concerning adverse side effects.   Labs were reviewed.  Recommend that he proceed with cycle number 6 today as scheduled.  The patient has CKD and his creatinine is 1.79 today.  Okay to proceed with treatment with his creatinine today.    We will see him back for follow-up visit in 4 weeks for evaluation and repeat blood work before undergoing cycle #7.  I will arrange for a restaging CT scan of the chest (w/o contrast due to CKD) prior to his next cycle of treatment.   The patient is supposed  to have a colonoscopy as recommended for screening by his PCP. Discussed it is ok to proceed with his routine health screenings as indicated.   We will continue to monitor his thyroid and make dose adjustments as necessary.    The patient was advised to call immediately if he has any concerning symptoms in the interval. The patient voices understanding of current disease status and treatment options and is in agreement with the current care plan. All questions were answered. The patient knows to call the clinic with any problems, questions or concerns. We can certainly see the patient much sooner if necessary     Orders Placed This Encounter  Procedures   CT Chest Wo Contrast    Standing Status:   Future    Standing Expiration Date:   08/31/2023    Order Specific Question:   Preferred imaging location?    Answer:   [facility]     The total time spent in the appointment was 20-29 minutes.   Iviana Blasingame L [place] Trawick, PA-C 08/31/22

## 2022-08-31 ENCOUNTER — Inpatient Hospital Stay (HOSPITAL_BASED_OUTPATIENT_CLINIC_OR_DEPARTMENT_OTHER): Payer: PPO | Admitting: Physician Assistant

## 2022-08-31 ENCOUNTER — Inpatient Hospital Stay: Payer: PPO

## 2022-08-31 ENCOUNTER — Other Ambulatory Visit: Payer: Self-pay

## 2022-08-31 ENCOUNTER — Inpatient Hospital Stay: Payer: PPO | Attending: Internal Medicine

## 2022-08-31 VITALS — BP 136/74 | HR 48 | Temp 97.7°F | Resp 16 | Ht 68.0 in | Wt 175.7 lb

## 2022-08-31 VITALS — BP 123/62 | HR 55 | Resp 16

## 2022-08-31 DIAGNOSIS — Z5112 Encounter for antineoplastic immunotherapy: Secondary | ICD-10-CM

## 2022-08-31 DIAGNOSIS — C3412 Malignant neoplasm of upper lobe, left bronchus or lung: Secondary | ICD-10-CM | POA: Insufficient documentation

## 2022-08-31 DIAGNOSIS — J449 Chronic obstructive pulmonary disease, unspecified: Secondary | ICD-10-CM | POA: Insufficient documentation

## 2022-08-31 DIAGNOSIS — C3492 Malignant neoplasm of unspecified part of left bronchus or lung: Secondary | ICD-10-CM

## 2022-08-31 DIAGNOSIS — Z79899 Other long term (current) drug therapy: Secondary | ICD-10-CM | POA: Diagnosis not present

## 2022-08-31 DIAGNOSIS — Z79890 Hormone replacement therapy (postmenopausal): Secondary | ICD-10-CM | POA: Diagnosis not present

## 2022-08-31 DIAGNOSIS — R0609 Other forms of dyspnea: Secondary | ICD-10-CM | POA: Diagnosis not present

## 2022-08-31 DIAGNOSIS — Z923 Personal history of irradiation: Secondary | ICD-10-CM | POA: Insufficient documentation

## 2022-08-31 LAB — CBC WITH DIFFERENTIAL (CANCER CENTER ONLY)
Abs Immature Granulocytes: 0.02 10*3/uL (ref 0.00–0.07)
Basophils Absolute: 0 10*3/uL (ref 0.0–0.1)
Basophils Relative: 1 %
Eosinophils Absolute: 0.1 10*3/uL (ref 0.0–0.5)
Eosinophils Relative: 3 %
HCT: 38.8 % — ABNORMAL LOW (ref 39.0–52.0)
Hemoglobin: 13.7 g/dL (ref 13.0–17.0)
Immature Granulocytes: 0 %
Lymphocytes Relative: 27 %
Lymphs Abs: 1.2 10*3/uL (ref 0.7–4.0)
MCH: 33.3 pg (ref 26.0–34.0)
MCHC: 35.3 g/dL (ref 30.0–36.0)
MCV: 94.4 fL (ref 80.0–100.0)
Monocytes Absolute: 0.6 10*3/uL (ref 0.1–1.0)
Monocytes Relative: 12 %
Neutro Abs: 2.7 10*3/uL (ref 1.7–7.7)
Neutrophils Relative %: 57 %
Platelet Count: 142 10*3/uL — ABNORMAL LOW (ref 150–400)
RBC: 4.11 MIL/uL — ABNORMAL LOW (ref 4.22–5.81)
RDW: 12.2 % (ref 11.5–15.5)
WBC Count: 4.6 10*3/uL (ref 4.0–10.5)
nRBC: 0 % (ref 0.0–0.2)

## 2022-08-31 LAB — CMP (CANCER CENTER ONLY)
ALT: 15 U/L (ref 0–44)
AST: 15 U/L (ref 15–41)
Albumin: 4.1 g/dL (ref 3.5–5.0)
Alkaline Phosphatase: 72 U/L (ref 38–126)
Anion gap: 5 (ref 5–15)
BUN: 12 mg/dL (ref 8–23)
CO2: 30 mmol/L (ref 22–32)
Calcium: 9.2 mg/dL (ref 8.9–10.3)
Chloride: 107 mmol/L (ref 98–111)
Creatinine: 1.79 mg/dL — ABNORMAL HIGH (ref 0.61–1.24)
GFR, Estimated: 39 mL/min — ABNORMAL LOW (ref >60)
Glucose, Bld: 72 mg/dL (ref 70–99)
Potassium: 4.3 mmol/L (ref 3.5–5.1)
Sodium: 142 mmol/L (ref 135–145)
Total Bilirubin: 0.5 mg/dL (ref 0.3–1.2)
Total Protein: 7.2 g/dL (ref 6.5–8.1)

## 2022-08-31 LAB — TSH: TSH: 8.056 u[IU]/mL — ABNORMAL HIGH (ref 0.350–4.500)

## 2022-08-31 MED ORDER — SODIUM CHLORIDE 0.9 % IV SOLN
1500.0000 mg | Freq: Once | INTRAVENOUS | Status: AC
  Administered 2022-08-31: 1500 mg via INTRAVENOUS
  Filled 2022-08-31: qty 30

## 2022-08-31 MED ORDER — SODIUM CHLORIDE 0.9 % IV SOLN: Freq: Once | INTRAVENOUS | Status: AC

## 2022-08-31 NOTE — Patient Instructions (Signed)
[facility] MED[facility]COL[facility]Y   Discharge Instructions: Thank you for choosing [facility] to provide your oncology and hematology care.   If you have a lab appointment with the Cancer Center, please go directly to the Cancer Center and check in at the registration area.   Wear comfortable clothing and clothing appropriate for easy access to any Portacath or PICC line.   We strive to give you quality time with your provider. You may need to reschedule your appointment if you arrive late (15 or more minutes).  Arriving late affects you and other patients whose appointments are after yours.  Also, if you miss three or more appointments without notifying the office, you may be dismissed from the clinic at the provider's discretion.      For prescription refill requests, have your pharmacy contact our office and allow 72 hours for refills to be completed.    Today you received the following chemotherapy and/or immunotherapy agents: Durvalumab (Imfinzi).      To help prevent nausea and vomiting after your treatment, we encourage you to take your nausea medication as directed.  BELOW ARE SYMPTOMS THAT SHOULD BE REPORTED IMMEDIATELY: *FEVER GREATER THAN 100.4 F (38 C) OR HIGHER *CHILLS OR SWEATING *NAUSEA AND VOMITING THAT IS NOT CONTROLLED WITH YOUR NAUSEA MEDICATION *UNUSUAL SHORTNESS OF BREATH *UNUSUAL BRUISING OR BLEEDING *URINARY PROBLEMS (pain or burning when urinating, or frequent urination) *BOWEL PROBLEMS (unusual diarrhea, constipation, pain near the anus) TENDERNESS IN MOUTH AND THROAT WITH OR WITHOUT PRESENCE OF ULCERS (sore throat, sores in mouth, or a toothache) UNUSUAL RASH, SWELLING OR PAIN  UNUSUAL VAGINAL DISCHARGE OR ITCHING   Items with * indicate a potential emergency and should be followed up as soon as possible or go to the Emergency Department if any problems should occur.  Please show the CHEMOTHERAPY ALERT CARD or IMMUNOTHERAPY ALERT CARD  at check-in to the Emergency Department and triage nurse.  Should you have questions after your visit or need to cancel or reschedule your appointment, please contact [facility]  Dept: [phone]  and follow the prompts.  Office hours are 8:00 a.m. to 4:30 p.m. Monday - Friday. Please note that voicemails left after 4:00 p.m. may not be returned until the following business day.  We are closed weekends and major holidays. You have access to a nurse at all times for urgent questions. Please call the main number to the clinic Dept: [phone] and follow the prompts.   For any non-urgent questions, you may also contact your provider using MyChart. We now offer e-Visits for anyone 18 and older to request care online for non-urgent symptoms. For details visit [email or web address].   Also download the MyChart app! Go to the app store, search "MyChart", open the app, select [facility], and log in with your MyChart username and password.  Due to Covid, a mask is required upon entering the hospital/clinic. If you do not have a mask, one will be given to you upon arrival. For doctor visits, patients may have 1 support person aged 18 or older with them. For treatment visits, patients cannot have anyone with them due to current Covid guidelines and our immunocompromised population.   

## 2022-08-31 NOTE — Progress Notes (Signed)
Per Cassie, PA ok to treat with creatinine 1.79.

## 2022-09-02 LAB — T4: T4, Total: 8.3 ug/dL (ref 4.5–12.0)

## 2022-09-03 ENCOUNTER — Other Ambulatory Visit: Payer: Self-pay

## 2022-09-05 ENCOUNTER — Other Ambulatory Visit: Payer: Self-pay

## 2022-09-11 ENCOUNTER — Other Ambulatory Visit: Payer: Self-pay

## 2022-09-19 ENCOUNTER — Telehealth: Payer: Self-pay | Admitting: Internal Medicine

## 2022-09-19 NOTE — Telephone Encounter (Signed)
Called patient regarding upcoming October, November and December appointments. Spoke with patient's spouse, patient will be notified.

## 2022-09-23 ENCOUNTER — Ambulatory Visit (HOSPITAL_COMMUNITY)
Admission: RE | Admit: 2022-09-23 | Discharge: 2022-09-23 | Disposition: A | Discharge status: Home / Self Care | Payer: PPO | Source: Ambulatory Visit | Attending: Physician Assistant | Admitting: Physician Assistant

## 2022-09-23 ENCOUNTER — Encounter (HOSPITAL_COMMUNITY): Payer: Self-pay

## 2022-09-23 DIAGNOSIS — C3492 Malignant neoplasm of unspecified part of left bronchus or lung: Secondary | ICD-10-CM | POA: Diagnosis present

## 2022-09-27 ENCOUNTER — Other Ambulatory Visit: Payer: PPO

## 2022-09-27 ENCOUNTER — Ambulatory Visit: Payer: PPO

## 2022-09-27 ENCOUNTER — Ambulatory Visit: Payer: PPO | Admitting: Internal Medicine

## 2022-09-28 ENCOUNTER — Inpatient Hospital Stay: Payer: PPO

## 2022-09-28 ENCOUNTER — Inpatient Hospital Stay (HOSPITAL_BASED_OUTPATIENT_CLINIC_OR_DEPARTMENT_OTHER): Payer: PPO | Admitting: Internal Medicine

## 2022-09-28 ENCOUNTER — Other Ambulatory Visit: Payer: Self-pay

## 2022-09-28 ENCOUNTER — Other Ambulatory Visit: Payer: Self-pay | Admitting: Internal Medicine

## 2022-09-28 ENCOUNTER — Inpatient Hospital Stay: Payer: PPO | Attending: Internal Medicine

## 2022-09-28 VITALS — BP 124/64 | HR 87 | Temp 97.7°F | Resp 16 | Ht 68.0 in | Wt 176.1 lb

## 2022-09-28 DIAGNOSIS — Z5112 Encounter for antineoplastic immunotherapy: Secondary | ICD-10-CM | POA: Diagnosis present

## 2022-09-28 DIAGNOSIS — J449 Chronic obstructive pulmonary disease, unspecified: Secondary | ICD-10-CM | POA: Diagnosis not present

## 2022-09-28 DIAGNOSIS — I2510 Atherosclerotic heart disease of native coronary artery without angina pectoris: Secondary | ICD-10-CM | POA: Diagnosis not present

## 2022-09-28 DIAGNOSIS — C3492 Malignant neoplasm of unspecified part of left bronchus or lung: Secondary | ICD-10-CM

## 2022-09-28 DIAGNOSIS — I700 Atherosclerosis of aorta: Secondary | ICD-10-CM | POA: Diagnosis not present

## 2022-09-28 DIAGNOSIS — Z79890 Hormone replacement therapy (postmenopausal): Secondary | ICD-10-CM | POA: Diagnosis not present

## 2022-09-28 DIAGNOSIS — R252 Cramp and spasm: Secondary | ICD-10-CM | POA: Diagnosis not present

## 2022-09-28 DIAGNOSIS — Z9221 Personal history of antineoplastic chemotherapy: Secondary | ICD-10-CM | POA: Diagnosis not present

## 2022-09-28 DIAGNOSIS — Z923 Personal history of irradiation: Secondary | ICD-10-CM | POA: Insufficient documentation

## 2022-09-28 DIAGNOSIS — Z79899 Other long term (current) drug therapy: Secondary | ICD-10-CM | POA: Diagnosis not present

## 2022-09-28 DIAGNOSIS — C3412 Malignant neoplasm of upper lobe, left bronchus or lung: Secondary | ICD-10-CM | POA: Insufficient documentation

## 2022-09-28 LAB — CBC WITH DIFFERENTIAL (CANCER CENTER ONLY)
Abs Immature Granulocytes: 0.01 10*3/uL (ref 0.00–0.07)
Basophils Absolute: 0 10*3/uL (ref 0.0–0.1)
Basophils Relative: 1 %
Eosinophils Absolute: 0.1 10*3/uL (ref 0.0–0.5)
Eosinophils Relative: 2 %
HCT: 37.7 % — ABNORMAL LOW (ref 39.0–52.0)
Hemoglobin: 13 g/dL (ref 13.0–17.0)
Immature Granulocytes: 0 %
Lymphocytes Relative: 28 %
Lymphs Abs: 0.9 10*3/uL (ref 0.7–4.0)
MCH: 32.6 pg (ref 26.0–34.0)
MCHC: 34.5 g/dL (ref 30.0–36.0)
MCV: 94.5 fL (ref 80.0–100.0)
Monocytes Absolute: 0.3 10*3/uL (ref 0.1–1.0)
Monocytes Relative: 9 %
Neutro Abs: 2 10*3/uL (ref 1.7–7.7)
Neutrophils Relative %: 60 %
Platelet Count: 131 10*3/uL — ABNORMAL LOW (ref 150–400)
RBC: 3.99 MIL/uL — ABNORMAL LOW (ref 4.22–5.81)
RDW: 12.9 % (ref 11.5–15.5)
WBC Count: 3.4 10*3/uL — ABNORMAL LOW (ref 4.0–10.5)
nRBC: 0 % (ref 0.0–0.2)

## 2022-09-28 LAB — CMP (CANCER CENTER ONLY)
ALT: 10 U/L (ref 0–44)
AST: 13 U/L — ABNORMAL LOW (ref 15–41)
Albumin: 3.8 g/dL (ref 3.5–5.0)
Alkaline Phosphatase: 63 U/L (ref 38–126)
Anion gap: 6 (ref 5–15)
BUN: 11 mg/dL (ref 8–23)
CO2: 26 mmol/L (ref 22–32)
Calcium: 9 mg/dL (ref 8.9–10.3)
Chloride: 107 mmol/L (ref 98–111)
Creatinine: 1.93 mg/dL — ABNORMAL HIGH (ref 0.61–1.24)
GFR, Estimated: 35 mL/min — ABNORMAL LOW (ref >60)
Glucose, Bld: 155 mg/dL — ABNORMAL HIGH (ref 70–99)
Potassium: 4.2 mmol/L (ref 3.5–5.1)
Sodium: 139 mmol/L (ref 135–145)
Total Bilirubin: 0.5 mg/dL (ref 0.3–1.2)
Total Protein: 6.4 g/dL — ABNORMAL LOW (ref 6.5–8.1)

## 2022-09-28 LAB — TSH: TSH: 15.054 u[IU]/mL — ABNORMAL HIGH (ref 0.350–4.500)

## 2022-09-28 MED ORDER — SODIUM CHLORIDE 0.9 % IV SOLN: Freq: Once | INTRAVENOUS | Status: AC

## 2022-09-28 MED ORDER — LEVOTHYROXINE SODIUM 75 MCG PO TABS: 75.0000 ug | ORAL_TABLET | Freq: Every day | ORAL | 2 refills | Status: DC

## 2022-09-28 MED ORDER — SODIUM CHLORIDE 0.9 % IV SOLN
1500.0000 mg | Freq: Once | INTRAVENOUS | Status: AC
  Administered 2022-09-28: 1500 mg via INTRAVENOUS
  Filled 2022-09-28: qty 30

## 2022-09-28 NOTE — Progress Notes (Signed)
[facility] [phone]   Fax:([phone]  OFFICE PROGRESS NOTE  [name], MD [address] [place] [address]  DIAGNOSIS: Recurrent non-small cell lung cancer initially diagnosed as stage IA (T1b, N0, M0) non-small cell lung cancer, adenocarcinoma presented with left upper lobe lung nodule diagnosed in January 2020.  The patient has evidence for disease recurrence in AP window lymphadenopathy in December 2022.   There was insufficient material for the molecular studies by [facility].   PRIOR THERAPY:  1) Status post left lingulectomy with lymph node dissection under the care of Dr. Servando Snare on January 01, 2019. 2) Concurrent chemoradiation with carboplatin for an AUC of 2 and paclitaxel 45 mg per metered squared weekly.  First dose on 01/24/2022.  Status post 7 cycles. Last dose was given March 14, 2022   CURRENT THERAPY: Consolidation treatment with immunotherapy with Imfinzi 1500 Mg IV every 4 weeks.  First dose Apr 12, 2022.  Status post 6 cycles.  INTERVAL HISTORY: Jack [place] 76 y.o. male returns to the clinic today for follow-up visit accompanied by his wife.  The patient is feeling fine today with no concerning complaints except for occasional cramps in his leg.  He denied having any chest pain, shortness of breath, cough or hemoptysis.  He denied having any nausea, vomiting, diarrhea or constipation.  He has no headache or visual changes.  He still very active.  He has been tolerating his treatment with immunotherapy fairly well.  He is here today for evaluation with repeat CT scan of the chest for restaging of his disease.   MEDICAL HISTORY: Past Medical History:  Diagnosis Date   Adenomatous colon polyp    Anxiety    Blood transfusion without reported diagnosis    Cancer ([facility])    Lung; 11/2018   CKD (chronic kidney disease)    stage III 11/09/18   COPD (chronic obstructive pulmonary disease) (HCC)    Gastric  ulcer    GERD (gastroesophageal reflux disease)    High cholesterol    History of radiation therapy    Left Lung- 02/17/22-03/18/22-Dr. Gery Pray   Hypertension    Pre-diabetes    A1c 6.2% 11/09/18    ALLERGIES:  has No Known Allergies.  MEDICATIONS:  Current Outpatient Medications  Medication Sig Dispense Refill   albuterol (PROVENTIL HFA;VENTOLIN HFA) 108 (90 Base) MCG/ACT inhaler Inhale 2 puffs into the lungs every 6 (six) hours as needed for wheezing or shortness of breath. Reported on 06/02/2016     amLODipine (NORVASC) 10 MG tablet Take 10 mg by mouth daily.      ANORO ELLIPTA 62.5-25 MCG/ACT AEPB Inhale 1 puff into the lungs daily.     diazepam (VALIUM) 2 MG tablet Take by mouth. (Patient not taking: Reported on 08/31/2022)     esomeprazole (NEXIUM) 40 MG capsule Take 40 mg by mouth daily.      folic acid (FOLVITE) 1 MG tablet Take by mouth. (Patient not taking: Reported on 08/31/2022)     levothyroxine (SYNTHROID) 50 MCG tablet Take 1 tablet (50 mcg total) by mouth daily before breakfast. 30 tablet 2   losartan (COZAAR) 50 MG tablet Take 50 mg by mouth daily.      prochlorperazine (COMPAZINE) 10 MG tablet TAKE 1 TABLET BY MOUTH EVERY 6 HOURS AS NEEDED 30 tablet 0   rosuvastatin (CRESTOR) 10 MG tablet Take 10 mg by mouth daily.     sucralfate (CARAFATE) 1  g tablet Take 1 tablet (1 g total) by mouth 4 (four) times daily -  with meals and at bedtime. Crush and dissolve in 10 mL of warm water prior to swallowing 120 tablet 1   traZODone (DESYREL) 50 MG tablet Take 50 mg by mouth at bedtime.     No current facility-administered medications for this visit.    SURGICAL HISTORY:  Past Surgical History:  Procedure Laterality Date   BRONCHIAL NEEDLE ASPIRATION BIOPSY  01/10/2022   Procedure: BRONCHIAL NEEDLE ASPIRATION BIOPSIES;  Surgeon: Collene Gobble, MD;  Location: [facility] ENDOSCOPY;  Service: Cardiopulmonary;;   COLONOSCOPY  2008   ESOPHAGOGASTRODUODENOSCOPY  11/23/2011   Procedure:  ESOPHAGOGASTRODUODENOSCOPY (EGD);  Surgeon: Owens Loffler, MD;  Location: [facility] ENDOSCOPY;  Service: Endoscopy;  Laterality: N/A;   INTERCOSTAL NERVE BLOCK Left 01/01/2019   Procedure: INTERCOSTAL NERVE BLOCK;  Surgeon: Grace Isaac, MD;  Location: [facility];  Service: Thoracic;  Laterality: Left;   LUNG BIOPSY  12/18/2018   Procedure: LUNG BIOPSY - of Left Upper Lobe Biopsy of #7 Node Biopsy of #10L Node;  Surgeon: Grace Isaac, MD;  Location: [facility];  Service: Thoracic;;   LYMPH NODE DISSECTION Left 01/01/2019   Procedure: LYMPH NODE DISSECTION;  Surgeon: Grace Isaac, MD;  Location: [facility];  Service: Thoracic;  Laterality: Left;   UPPER GASTROINTESTINAL ENDOSCOPY  11/23/11   VIDEO ASSISTED THORACOSCOPY (VATS)/WEDGE RESECTION Left 01/01/2019   Procedure: VIDEO ASSISTED THORACOSCOPY (VATS)/ LEFT LINGULECTOMY;  Surgeon: Grace Isaac, MD;  Location: [facility];  Service: Thoracic;  Laterality: Left;   VIDEO BRONCHOSCOPY WITH ENDOBRONCHIAL NAVIGATION N/A 12/18/2018   Procedure: VIDEO BRONCHOSCOPY WITH ENDOBRONCHIAL NAVIGATION with Placement of Fiducial Markers x 3;  Surgeon: Grace Isaac, MD;  Location: [facility];  Service: Thoracic;  Laterality: N/A;   VIDEO BRONCHOSCOPY WITH ENDOBRONCHIAL ULTRASOUND N/A 12/18/2018   Procedure: VIDEO BRONCHOSCOPY WITH ENDOBRONCHIAL ULTRASOUND;  Surgeon: Grace Isaac, MD;  Location: [facility];  Service: Thoracic;  Laterality: N/A;   VIDEO BRONCHOSCOPY WITH ENDOBRONCHIAL ULTRASOUND Left 01/10/2022   Procedure: VIDEO BRONCHOSCOPY WITH ENDOBRONCHIAL ULTRASOUND;  Surgeon: Collene Gobble, MD;  Location: [facility];  Service: Cardiopulmonary;  Laterality: Left;    REVIEW OF SYSTEMS:  Constitutional: negative Eyes: negative Ears, nose, mouth, throat, and face: negative Respiratory: negative Cardiovascular: negative Gastrointestinal: negative Genitourinary:negative Integument/breast: negative Hematologic/lymphatic:  negative Musculoskeletal:negative Neurological: negative Behavioral/Psych: negative Endocrine: negative Allergic/Immunologic: negative   PHYSICAL EXAMINATION: General appearance: alert, cooperative, and no distress Head: Normocephalic, without obvious abnormality, atraumatic Neck: no adenopathy, no JVD, supple, symmetrical, trachea midline, and thyroid not enlarged, symmetric, no tenderness/mass/nodules Lymph nodes: Cervical, supraclavicular, and axillary nodes normal. Resp: clear to auscultation bilaterally Back: symmetric, no curvature. ROM normal. No CVA tenderness. Cardio: regular rate and rhythm, S1, S2 normal, no murmur, click, rub or gallop GI: soft, non-tender; bowel sounds normal; no masses,  no organomegaly Extremities: extremities normal, atraumatic, no cyanosis or edema Neurologic: Alert and oriented X 3, normal strength and tone. Normal symmetric reflexes. Normal coordination and gait  ECOG PERFORMANCE STATUS: 1 - Symptomatic but completely ambulatory  Blood pressure 124/64, pulse 87, temperature 97.7 F (36.5 C), temperature source Oral, resp. rate 16, height 5\' 8"  (1.727 m), weight 176 lb 1 oz (79.9 kg), SpO2 94 %.  LABORATORY DATA: Lab Results  Component Value Date   WBC 3.4 (L) 09/28/2022   HGB 13.0 09/28/2022   HCT 37.7 (L) 09/28/2022   MCV 94.5 09/28/2022   PLT 131 (L) 09/28/2022  Chemistry      Component Value Date/Time   NA 139 09/28/2022 0855   K 4.2 09/28/2022 0855   CL 107 09/28/2022 0855   CO2 26 09/28/2022 0855   BUN 11 09/28/2022 0855   CREATININE 1.93 (H) 09/28/2022 0855      Component Value Date/Time   CALCIUM 9.0 09/28/2022 0855   ALKPHOS 63 09/28/2022 0855   AST 13 (L) 09/28/2022 0855   ALT 10 09/28/2022 0855   BILITOT 0.5 09/28/2022 0855       RADIOGRAPHIC STUDIES: CT Chest Wo Contrast  Result Date: 09/25/2022 CLINICAL DATA:  76 year old male with history of non-small cell lung cancer status post chemotherapy and radiation  therapy complete in April 2023. Ongoing immunotherapy. * Tracking Code: BO * EXAM: CT CHEST WITHOUT CONTRAST TECHNIQUE: Multidetector CT imaging of the chest was performed following the standard protocol without IV contrast. RADIATION DOSE REDUCTION: This exam was performed according to the departmental dose-optimization program which includes automated exposure control, adjustment of the mA and/or kV according to patient size and/or use of iterative reconstruction technique. COMPARISON:  Chest CT 05/31/2022. FINDINGS: Cardiovascular: Heart size is normal. There is no significant pericardial fluid, thickening or pericardial calcification. There is aortic atherosclerosis, as well as atherosclerosis of the great vessels of the mediastinum and the coronary arteries, including calcified atherosclerotic plaque in the left main, left anterior descending, left circumflex and right coronary arteries. Mediastinum/Nodes: No pathologically enlarged mediastinal or hilar lymph nodes. Please note that accurate exclusion of hilar adenopathy is limited on noncontrast CT scans. Esophagus is unremarkable in appearance. No axillary lymphadenopathy. Lungs/Pleura: Postoperative changes of wedge resection are again noted in the posterior aspect of the left upper lobe where there is some mild chronic soft tissue thickening adjacent to the suture line which appears stable compared to the prior examination. A few scattered small pulmonary nodules are noted in the lungs, generally similar in number and size to the prior examination, with the largest of these lesions in the inferior aspect of the right upper lobe (axial image 86 of series 7) abutting the minor fissure measuring 5 mm. Occasional new micro nodularity is noted, best demonstrated by a tiny 2 mm nodule in the periphery of the anterior aspect of the right upper lobe (axial image 72 of series 7), highly nonspecific but statistically likely benign. No confluent consolidative airspace  disease. No pleural effusions. Upper Abdomen: Aortic atherosclerosis. Musculoskeletal: There are no aggressive appearing lytic or blastic lesions noted in the visualized portions of the skeleton. IMPRESSION: 1. Stable examination demonstrating postoperative changes of wedge resection in the posterior aspect of the left upper lobe with no definitive findings to suggest locally recurrent disease and no definitive evidence of metastatic disease in the thorax. 2. No evidence of recurrent lymphadenopathy. 3. Scattered tiny micronodules in the lungs bilaterally, generally stable compared to the prior study and statistically likely benign, as discussed above. Continued attention on follow-up studies is recommended. 4. Aortic atherosclerosis, in addition to left main and three-vessel coronary artery disease. Assessment for potential risk factor modification, dietary therapy or pharmacologic therapy may be warranted, if clinically indicated. Aortic Atherosclerosis (ICD10-I70.0). Electronically Signed   By: Vinnie Langton M.D.   On: 09/25/2022 06:29    ASSESSMENT AND PLAN: This is a very pleasant 76 years old white male diagnosed with recurrent non-small cell lung cancer that was initially diagnosed as stage IA (T1b, N0, M0) non-small cell lung cancer, adenocarcinoma presented with left upper lobe lung nodule in January 2020.  The patient has evidence for disease recurrence in AP window lymphadenopathy in December 2022.  He is status post left lingulectomy with lymph node dissection under the care of Dr. Servando Snare on January 01, 2019.  This was biopsy-proven to be recurrent adenocarcinoma of the lung. The patient underwent a course of concurrent chemoradiation with weekly carboplatin for AUC of 2 and paclitaxel 45 Mg/M2 status post 7 cycles.  Last dose was given March 14, 2022.  The patient has partial response to this treatment. The patient is currently undergoing consolidation treatment with immunotherapy with Imfinzi  1500 Mg IV every 4 weeks status post 6 cycles.   The patient has been tolerating this treatment well with no concerning adverse effects. He had repeat CT scan of the chest performed recently.  I personally and independently reviewed the scan and discussed the result with the patient today. His scan showed no concerning findings for disease progression. I recommended for the patient to proceed with cycle #7 today as planned. I will see him back for follow-up visit in 4 weeks for evaluation before the next cycle of his treatment. For the renal insufficiency he will follow with his primary care physician and nephrology. The patient was advised to call immediately if he has any other concerning symptoms in the interval. The patient voices understanding of current disease status and treatment options and is in agreement with the current care plan.  All questions were answered. The patient knows to call the clinic with any problems, questions or concerns. We can certainly see the patient much sooner if necessary.  Disclaimer: This note was dictated with voice recognition software. Similar sounding words can inadvertently be transcribed and may not be corrected upon review.

## 2022-09-28 NOTE — Patient Instructions (Signed)
[facility]  Discharge Instructions: Thank you for choosing [facility] to provide your oncology and hematology care.   If you have a lab appointment with the [facility], please go directly to the [facility] and check in at the registration area.   Wear comfortable clothing and clothing appropriate for easy access to any Portacath or PICC line.   We strive to give you quality time with your provider. You may need to reschedule your appointment if you arrive late (15 or more minutes).  Arriving late affects you and other patients whose appointments are after yours.  Also, if you miss three or more appointments without notifying the office, you may be dismissed from the clinic at the provider's discretion.      For prescription refill requests, have your pharmacy contact our office and allow 72 hours for refills to be completed.    Today you received the following chemotherapy and/or immunotherapy agents: Durvalumab      To help prevent nausea and vomiting after your treatment, we encourage you to take your nausea medication as directed.  BELOW ARE SYMPTOMS THAT SHOULD BE REPORTED IMMEDIATELY: *FEVER GREATER THAN 100.4 F (38 C) OR HIGHER *CHILLS OR SWEATING *NAUSEA AND VOMITING THAT IS NOT CONTROLLED WITH YOUR NAUSEA MEDICATION *UNUSUAL SHORTNESS OF BREATH *UNUSUAL BRUISING OR BLEEDING *URINARY PROBLEMS (pain or burning when urinating, or frequent urination) *BOWEL PROBLEMS (unusual diarrhea, constipation, pain near the anus) TENDERNESS IN MOUTH AND THROAT WITH OR WITHOUT PRESENCE OF ULCERS (sore throat, sores in mouth, or a toothache) UNUSUAL RASH, SWELLING OR PAIN  UNUSUAL VAGINAL DISCHARGE OR ITCHING   Items with * indicate a potential emergency and should be followed up as soon as possible or go to the Emergency Department if any problems should occur.  Please show the CHEMOTHERAPY ALERT CARD or IMMUNOTHERAPY ALERT CARD at check-in to  the Emergency Department and triage nurse.  Should you have questions after your visit or need to cancel or reschedule your appointment, please contact [facility]  Dept: [phone]  and follow the prompts.  Office hours are 8:00 a.m. to 4:30 p.m. Monday - Friday. Please note that voicemails left after 4:00 p.m. may not be returned until the following business day.  We are closed weekends and major holidays. You have access to a nurse at all times for urgent questions. Please call the main number to the clinic Dept: [phone] and follow the prompts.   For any non-urgent questions, you may also contact your provider using MyChart. We now offer e-Visits for anyone 100 and older to request care online for non-urgent symptoms. For details visit [email or web address].   Also download the MyChart app! Go to the app store, search "MyChart", open the app, select [facility], and log in with your MyChart username and password.  Masks are optional in the cancer centers. If you would like for your care team to wear a mask while they are taking care of you, please let them know. You may have one support person who is at least 76 years old accompany you for your appointments.

## 2022-09-28 NOTE — Progress Notes (Addendum)
Per Dr Julien Nordmann ok to treat with creat 1.93 mg/dL

## 2022-09-29 ENCOUNTER — Telehealth: Payer: Self-pay

## 2022-09-29 NOTE — Telephone Encounter (Signed)
LM for pt with lab results and the new rx for levothyroxine sent to the pharmacy.

## 2022-09-29 NOTE — Telephone Encounter (Signed)
-----   Message from Ardeen Garland, RN sent at 09/29/2022  3:51 PM EDT -----  ----- Message ----- From: Curt Bears, MD Sent: 09/28/2022   4:54 PM EDT To: Ardeen Garland, RN; [facility]  Please let the patient know that I changed his levothyroxine dose to 75 mcg p.o. daily because of the elevated TSH and I sent the prescription to his pharmacy. ----- Message ----- From: Buel Ream, Lab In [place] Sent: 09/28/2022   9:07 AM EDT To: Curt Bears, MD

## 2022-10-01 ENCOUNTER — Other Ambulatory Visit: Payer: Self-pay

## 2022-10-02 ENCOUNTER — Other Ambulatory Visit: Payer: Self-pay

## 2022-10-11 ENCOUNTER — Other Ambulatory Visit: Payer: Self-pay

## 2022-10-21 NOTE — Progress Notes (Unsigned)
[facility] OFFICE PROGRESS NOTE  [name], MD [address] [place] [address]  DIAGNOSIS: Recurrent non-small cell lung cancer initially diagnosed as stage IA (T1b, N0, M0) non-small cell lung cancer, adenocarcinoma presented with left upper lobe lung nodule diagnosed in January 2020.  The patient has evidence for disease recurrence in AP window lymphadenopathy in December 2022.   There was insufficient material for the molecular studies by [facility].  PRIOR THERAPY: 1) Status post left lingulectomy with lymph node dissection under the care of Dr. Servando Snare on January 01, 2019 2) Concurrent chemoradiation with carboplatin for an AUC of 2 and paclitaxel 45 mg per metered squared weekly.  First dose on 01/24/2022.  Status post 7 cycles. Last dose was given March 14, 2022  CURRENT THERAPY: Consolidation treatment with immunotherapy with Imfinzi 1500 Mg IV every 4 weeks.  First dose Apr 12, 2022.  Status post 7 cycles.   INTERVAL HISTORY: Jack Taylor 76 y.o. male returns to the clinic today for a follow-up visit accompanied by his wife.  The patient is feeling well currently well today without any concerning complaints.  The patient is currently undergoing immunotherapy with Imfinzi and is tolerating well without any concerning adverse side effects. He denies any recent fever, chills, night sweats, or  weight loss.  Denies any chest pain, cough, or hemoptysis.  He reports he has baseline dyspnea on exertion.  Overall his dyspnea on exertion has improved greatly compared to prior. Denies cough, hemoptysis, or chest pain. He was started on synthroid at his last appointment which is is complaint with. He denies any nausea, vomiting, or diarrhea. If he has constipation, his wife will give him a stool softener. He denies any headache or visual changes.  Denies any rashes or skin changes.   He is here today for evaluation before starting cycle #8.   MEDICAL  HISTORY: Past Medical History:  Diagnosis Date   Adenomatous colon polyp    Anxiety    Blood transfusion without reported diagnosis    Cancer ([facility])    Lung; 11/2018   CKD (chronic kidney disease)    stage III 11/09/18   COPD (chronic obstructive pulmonary disease) (HCC)    Gastric ulcer    GERD (gastroesophageal reflux disease)    High cholesterol    History of radiation therapy    Left Lung- 02/17/22-03/18/22-Dr. Gery Pray   Hypertension    Pre-diabetes    A1c 6.2% 11/09/18    ALLERGIES:  has No Known Allergies.  MEDICATIONS:  Current Outpatient Medications  Medication Sig Dispense Refill   albuterol (PROVENTIL HFA;VENTOLIN HFA) 108 (90 Base) MCG/ACT inhaler Inhale 2 puffs into the lungs every 6 (six) hours as needed for wheezing or shortness of breath. Reported on 06/02/2016     amLODipine (NORVASC) 10 MG tablet Take 10 mg by mouth daily.      ANORO ELLIPTA 62.5-25 MCG/ACT AEPB Inhale 1 puff into the lungs daily.     diazepam (VALIUM) 2 MG tablet Take by mouth. (Patient not taking: Reported on 08/31/2022)     esomeprazole (NEXIUM) 40 MG capsule Take 40 mg by mouth daily.      folic acid (FOLVITE) 1 MG tablet Take by mouth. (Patient not taking: Reported on 08/31/2022)     levothyroxine (SYNTHROID) 75 MCG tablet Take 1 tablet (75 mcg total) by mouth daily before breakfast. 30 tablet 2   losartan (COZAAR) 50 MG tablet Take 50 mg by mouth daily.  OVER THE COUNTER MEDICATION Take 1 tablet by mouth daily as needed (for constipation).     prochlorperazine (COMPAZINE) 10 MG tablet TAKE 1 TABLET BY MOUTH EVERY 6 HOURS AS NEEDED 30 tablet 0   rosuvastatin (CRESTOR) 10 MG tablet Take 10 mg by mouth daily.     sucralfate (CARAFATE) 1 g tablet Take 1 tablet (1 g total) by mouth 4 (four) times daily -  with meals and at bedtime. Crush and dissolve in 10 mL of warm water prior to swallowing 120 tablet 1   traZODone (DESYREL) 50 MG tablet Take 50 mg by mouth at bedtime.     No current  facility-administered medications for this visit.    SURGICAL HISTORY:  Past Surgical History:  Procedure Laterality Date   BRONCHIAL NEEDLE ASPIRATION BIOPSY  01/10/2022   Procedure: BRONCHIAL NEEDLE ASPIRATION BIOPSIES;  Surgeon: Collene Gobble, MD;  Location: [facility] ENDOSCOPY;  Service: Cardiopulmonary;;   COLONOSCOPY  2008   ESOPHAGOGASTRODUODENOSCOPY  11/23/2011   Procedure: ESOPHAGOGASTRODUODENOSCOPY (EGD);  Surgeon: Owens Loffler, MD;  Location: [facility] ENDOSCOPY;  Service: Endoscopy;  Laterality: N/A;   INTERCOSTAL NERVE BLOCK Left 01/01/2019   Procedure: INTERCOSTAL NERVE BLOCK;  Surgeon: Grace Isaac, MD;  Location: [facility];  Service: Thoracic;  Laterality: Left;   LUNG BIOPSY  12/18/2018   Procedure: LUNG BIOPSY - of Left Upper Lobe Biopsy of #7 Node Biopsy of #10L Node;  Surgeon: Grace Isaac, MD;  Location: [facility];  Service: Thoracic;;   LYMPH NODE DISSECTION Left 01/01/2019   Procedure: LYMPH NODE DISSECTION;  Surgeon: Grace Isaac, MD;  Location: [facility];  Service: Thoracic;  Laterality: Left;   UPPER GASTROINTESTINAL ENDOSCOPY  11/23/11   VIDEO ASSISTED THORACOSCOPY (VATS)/WEDGE RESECTION Left 01/01/2019   Procedure: VIDEO ASSISTED THORACOSCOPY (VATS)/ LEFT LINGULECTOMY;  Surgeon: Grace Isaac, MD;  Location: [facility];  Service: Thoracic;  Laterality: Left;   VIDEO BRONCHOSCOPY WITH ENDOBRONCHIAL NAVIGATION N/A 12/18/2018   Procedure: VIDEO BRONCHOSCOPY WITH ENDOBRONCHIAL NAVIGATION with Placement of Fiducial Markers x 3;  Surgeon: Grace Isaac, MD;  Location: [facility];  Service: Thoracic;  Laterality: N/A;   VIDEO BRONCHOSCOPY WITH ENDOBRONCHIAL ULTRASOUND N/A 12/18/2018   Procedure: VIDEO BRONCHOSCOPY WITH ENDOBRONCHIAL ULTRASOUND;  Surgeon: Grace Isaac, MD;  Location: [facility];  Service: Thoracic;  Laterality: N/A;   VIDEO BRONCHOSCOPY WITH ENDOBRONCHIAL ULTRASOUND Left 01/10/2022   Procedure: VIDEO BRONCHOSCOPY WITH ENDOBRONCHIAL ULTRASOUND;  Surgeon: Collene Gobble, MD;  Location: [facility];  Service: Cardiopulmonary;  Laterality: Left;    REVIEW OF SYSTEMS:   Review of Systems  Constitutional: Negative for appetite change, chills, fatigue, fever and unexpected weight change.  HENT:   Negative for mouth sores, nosebleeds, sore throat and trouble swallowing.   Eyes: Negative for eye problems and icterus.  Respiratory: Negative for cough, hemoptysis, shortness of breath and wheezing.   Cardiovascular: Negative for chest pain and leg swelling.  Gastrointestinal: Negative for abdominal pain, constipation, diarrhea, nausea and vomiting.  Genitourinary: Negative for bladder incontinence, difficulty urinating, dysuria, frequency and hematuria.   Musculoskeletal: Negative for back pain, gait problem, neck pain and neck stiffness.  Skin: Negative for itching and rash.  Neurological: Negative for dizziness, extremity weakness, gait problem, headaches, light-headedness and seizures.  Hematological: Negative for adenopathy. Does not bruise/bleed easily.  Psychiatric/Behavioral: Negative for confusion, depression and sleep disturbance. The patient is not nervous/anxious.     PHYSICAL EXAMINATION:  There were no vitals taken for this visit.  ECOG PERFORMANCE STATUS: {CHL  ONC ECOG [id number]  Physical Exam  Constitutional: Oriented to person, place, and time and well-developed, well-nourished, and in no distress. No distress.  HENT:  Head: Normocephalic and atraumatic.  Mouth/Throat: Oropharynx is clear and moist. No oropharyngeal exudate.  Eyes: Conjunctivae are normal. Right eye exhibits no discharge. Left eye exhibits no discharge. No scleral icterus.  Neck: Normal range of motion. Neck supple.  Cardiovascular: Normal rate, regular rhythm, normal heart sounds and intact distal pulses.   Pulmonary/Chest: Effort normal and breath sounds normal. No respiratory distress. No wheezes. No rales.  Abdominal: Soft. Bowel sounds are normal. Exhibits no  distension and no mass. There is no tenderness.  Musculoskeletal: Normal range of motion. Exhibits no edema.  Lymphadenopathy:    No cervical adenopathy.  Neurological: Alert and oriented to person, place, and time. Exhibits normal muscle tone. Gait normal. Coordination normal.  Skin: Skin is warm and dry. No rash noted. Not diaphoretic. No erythema. No pallor.  Psychiatric: Mood, memory and judgment normal.  Vitals reviewed.  LABORATORY DATA: Lab Results  Component Value Date   WBC 3.4 (L) 09/28/2022   HGB 13.0 09/28/2022   HCT 37.7 (L) 09/28/2022   MCV 94.5 09/28/2022   PLT 131 (L) 09/28/2022      Chemistry      Component Value Date/Time   NA 139 09/28/2022 0855   K 4.2 09/28/2022 0855   CL 107 09/28/2022 0855   CO2 26 09/28/2022 0855   BUN 11 09/28/2022 0855   CREATININE 1.93 (H) 09/28/2022 0855      Component Value Date/Time   CALCIUM 9.0 09/28/2022 0855   ALKPHOS 63 09/28/2022 0855   AST 13 (L) 09/28/2022 0855   ALT 10 09/28/2022 0855   BILITOT 0.5 09/28/2022 0855       RADIOGRAPHIC STUDIES:  CT Chest Wo Contrast  Result Date: 09/25/2022 CLINICAL DATA:  76 year old male with history of non-small cell lung cancer status post chemotherapy and radiation therapy complete in April 2023. Ongoing immunotherapy. * Tracking Code: BO * EXAM: CT CHEST WITHOUT CONTRAST TECHNIQUE: Multidetector CT imaging of the chest was performed following the standard protocol without IV contrast. RADIATION DOSE REDUCTION: This exam was performed according to the departmental dose-optimization program which includes automated exposure control, adjustment of the mA and/or kV according to patient size and/or use of iterative reconstruction technique. COMPARISON:  Chest CT 05/31/2022. FINDINGS: Cardiovascular: Heart size is normal. There is no significant pericardial fluid, thickening or pericardial calcification. There is aortic atherosclerosis, as well as atherosclerosis of the great vessels of  the mediastinum and the coronary arteries, including calcified atherosclerotic plaque in the left main, left anterior descending, left circumflex and right coronary arteries. Mediastinum/Nodes: No pathologically enlarged mediastinal or hilar lymph nodes. Please note that accurate exclusion of hilar adenopathy is limited on noncontrast CT scans. Esophagus is unremarkable in appearance. No axillary lymphadenopathy. Lungs/Pleura: Postoperative changes of wedge resection are again noted in the posterior aspect of the left upper lobe where there is some mild chronic soft tissue thickening adjacent to the suture line which appears stable compared to the prior examination. A few scattered small pulmonary nodules are noted in the lungs, generally similar in number and size to the prior examination, with the largest of these lesions in the inferior aspect of the right upper lobe (axial image 86 of series 7) abutting the minor fissure measuring 5 mm. Occasional new micro nodularity is noted, best demonstrated by a tiny 2 mm nodule in the periphery of the anterior  aspect of the right upper lobe (axial image 72 of series 7), highly nonspecific but statistically likely benign. No confluent consolidative airspace disease. No pleural effusions. Upper Abdomen: Aortic atherosclerosis. Musculoskeletal: There are no aggressive appearing lytic or blastic lesions noted in the visualized portions of the skeleton. IMPRESSION: 1. Stable examination demonstrating postoperative changes of wedge resection in the posterior aspect of the left upper lobe with no definitive findings to suggest locally recurrent disease and no definitive evidence of metastatic disease in the thorax. 2. No evidence of recurrent lymphadenopathy. 3. Scattered tiny micronodules in the lungs bilaterally, generally stable compared to the prior study and statistically likely benign, as discussed above. Continued attention on follow-up studies is recommended. 4. Aortic  atherosclerosis, in addition to left main and three-vessel coronary artery disease. Assessment for potential risk factor modification, dietary therapy or pharmacologic therapy may be warranted, if clinically indicated. Aortic Atherosclerosis (ICD10-I70.0). Electronically Signed   By: Vinnie Langton M.D.   On: 09/25/2022 06:29     ASSESSMENT/PLAN:  This is a very pleasant 76 year old Caucasian male diagnosed with recurrent non-small cell lung cancer that was initially diagnosed as a stage Ia (T1b, N0, M0) non-small cell lung cancer, adenocarcinoma.  He initially presented with a left upper lobe lung nodule in January 2020.  He had evidence of disease recurrence with an AP window lymphadenopathy in December 2022.  He is status post left lingulectomy with lymph node dissection under the care of Dr. Servando Snare on 01/01/2019.  This was biopsy-proven to be disease recurrent adenocarcinoma lung.  He then underwent a course of concurrent chemoradiation with carboplatin for an AUC 2 and paclitaxel 45 mg per metered squared.  He is status post 7 cycles.  His last dose was given on 03/14/2022.  The patient had a partial response to treatment.   He is currently undergoing consolidation immunotherapy with Imfinzi for 200 mg IV every 4 weeks.  He status post  7 cycles and is tolerated well without any concerning adverse side effects.   Labs were reviewed.  Recommend that he proceed with cycle number 6 today as scheduled.  The patient has CKD and his creatinine is *** today.  Okay to proceed with treatment with his creatinine today.    We will see him back for follow-up visit in 4 weeks for evaluation and repeat blood work before undergoing cycle #9.   The patient was advised to call immediately if she has any concerning symptoms in the interval. The patient voices understanding of current disease status and treatment options and is in agreement with the current care plan. All questions were answered. The patient  knows to call the clinic with any problems, questions or concerns. We can certainly see the patient much sooner if necessary       No orders of the defined types were placed in this encounter.    I spent {CHL ONC TIME VISIT - MVEHM:[id number]} counseling the patient face to face. The total time spent in the appointment was {CHL ONC TIME VISIT - MOQHU:[id number]}.  Komal Stangelo L Hutch Rhett, PA-C 10/21/22

## 2022-10-26 ENCOUNTER — Inpatient Hospital Stay: Payer: PPO

## 2022-10-26 ENCOUNTER — Inpatient Hospital Stay: Payer: PPO | Attending: Internal Medicine

## 2022-10-26 ENCOUNTER — Inpatient Hospital Stay (HOSPITAL_BASED_OUTPATIENT_CLINIC_OR_DEPARTMENT_OTHER): Payer: PPO | Admitting: Physician Assistant

## 2022-10-26 ENCOUNTER — Other Ambulatory Visit: Payer: Self-pay

## 2022-10-26 VITALS — BP 108/60 | HR 59 | Resp 16

## 2022-10-26 VITALS — BP 109/61 | HR 47 | Temp 98.1°F | Resp 15

## 2022-10-26 DIAGNOSIS — C3492 Malignant neoplasm of unspecified part of left bronchus or lung: Secondary | ICD-10-CM

## 2022-10-26 DIAGNOSIS — R0609 Other forms of dyspnea: Secondary | ICD-10-CM | POA: Diagnosis not present

## 2022-10-26 DIAGNOSIS — C3412 Malignant neoplasm of upper lobe, left bronchus or lung: Secondary | ICD-10-CM | POA: Diagnosis present

## 2022-10-26 DIAGNOSIS — J449 Chronic obstructive pulmonary disease, unspecified: Secondary | ICD-10-CM | POA: Diagnosis not present

## 2022-10-26 DIAGNOSIS — Z79899 Other long term (current) drug therapy: Secondary | ICD-10-CM | POA: Insufficient documentation

## 2022-10-26 DIAGNOSIS — Z923 Personal history of irradiation: Secondary | ICD-10-CM | POA: Insufficient documentation

## 2022-10-26 DIAGNOSIS — Z5112 Encounter for antineoplastic immunotherapy: Secondary | ICD-10-CM | POA: Insufficient documentation

## 2022-10-26 DIAGNOSIS — Z79890 Hormone replacement therapy (postmenopausal): Secondary | ICD-10-CM | POA: Diagnosis not present

## 2022-10-26 DIAGNOSIS — E039 Hypothyroidism, unspecified: Secondary | ICD-10-CM | POA: Insufficient documentation

## 2022-10-26 DIAGNOSIS — R490 Dysphonia: Secondary | ICD-10-CM | POA: Insufficient documentation

## 2022-10-26 LAB — CBC WITH DIFFERENTIAL (CANCER CENTER ONLY)
Abs Immature Granulocytes: 0.02 10*3/uL (ref 0.00–0.07)
Basophils Absolute: 0 10*3/uL (ref 0.0–0.1)
Basophils Relative: 1 %
Eosinophils Absolute: 0.1 10*3/uL (ref 0.0–0.5)
Eosinophils Relative: 2 %
HCT: 41.5 % (ref 39.0–52.0)
Hemoglobin: 14.4 g/dL (ref 13.0–17.0)
Immature Granulocytes: 0 %
Lymphocytes Relative: 26 %
Lymphs Abs: 1.2 10*3/uL (ref 0.7–4.0)
MCH: 33 pg (ref 26.0–34.0)
MCHC: 34.7 g/dL (ref 30.0–36.0)
MCV: 95 fL (ref 80.0–100.0)
Monocytes Absolute: 0.5 10*3/uL (ref 0.1–1.0)
Monocytes Relative: 11 %
Neutro Abs: 2.9 10*3/uL (ref 1.7–7.7)
Neutrophils Relative %: 60 %
Platelet Count: 124 10*3/uL — ABNORMAL LOW (ref 150–400)
RBC: 4.37 MIL/uL (ref 4.22–5.81)
RDW: 13 % (ref 11.5–15.5)
WBC Count: 4.8 10*3/uL (ref 4.0–10.5)
nRBC: 0 % (ref 0.0–0.2)

## 2022-10-26 LAB — CMP (CANCER CENTER ONLY)
ALT: 13 U/L (ref 0–44)
AST: 14 U/L — ABNORMAL LOW (ref 15–41)
Albumin: 4.3 g/dL (ref 3.5–5.0)
Alkaline Phosphatase: 57 U/L (ref 38–126)
Anion gap: 5 (ref 5–15)
BUN: 16 mg/dL (ref 8–23)
CO2: 28 mmol/L (ref 22–32)
Calcium: 9.5 mg/dL (ref 8.9–10.3)
Chloride: 106 mmol/L (ref 98–111)
Creatinine: 2.05 mg/dL — ABNORMAL HIGH (ref 0.61–1.24)
GFR, Estimated: 33 mL/min — ABNORMAL LOW (ref >60)
Glucose, Bld: 97 mg/dL (ref 70–99)
Potassium: 4.7 mmol/L (ref 3.5–5.1)
Sodium: 139 mmol/L (ref 135–145)
Total Bilirubin: 0.6 mg/dL (ref 0.3–1.2)
Total Protein: 7 g/dL (ref 6.5–8.1)

## 2022-10-26 LAB — TSH: TSH: 27.096 u[IU]/mL — ABNORMAL HIGH (ref 0.350–4.500)

## 2022-10-26 MED ORDER — SODIUM CHLORIDE 0.9 % IV SOLN
1500.0000 mg | Freq: Once | INTRAVENOUS | Status: AC
  Administered 2022-10-26: 1500 mg via INTRAVENOUS
  Filled 2022-10-26: qty 30

## 2022-10-26 MED ORDER — SODIUM CHLORIDE 0.9 % IV SOLN: Freq: Once | INTRAVENOUS | Status: AC

## 2022-10-26 NOTE — Patient Instructions (Signed)
[facility] MEDICAL ONCOLOGY   Discharge Instructions: Thank you for choosing [facility] to provide your oncology and hematology care.   If you have a lab appointment with the Cancer Center, please go directly to the Cancer Center and check in at the registration area.   Wear comfortable clothing and clothing appropriate for easy access to any Portacath or PICC line.   We strive to give you quality time with your provider. You may need to reschedule your appointment if you arrive late (15 or more minutes).  Arriving late affects you and other patients whose appointments are after yours.  Also, if you miss three or more appointments without notifying the office, you may be dismissed from the clinic at the provider's discretion.      For prescription refill requests, have your pharmacy contact our office and allow 72 hours for refills to be completed.    Today you received the following chemotherapy and/or immunotherapy agents: Durvalumab (Imfinzi).      To help prevent nausea and vomiting after your treatment, we encourage you to take your nausea medication as directed.  BELOW ARE SYMPTOMS THAT SHOULD BE REPORTED IMMEDIATELY: *FEVER GREATER THAN 100.4 F (38 C) OR HIGHER *CHILLS OR SWEATING *NAUSEA AND VOMITING THAT IS NOT CONTROLLED WITH YOUR NAUSEA MEDICATION *UNUSUAL SHORTNESS OF BREATH *UNUSUAL BRUISING OR BLEEDING *URINARY PROBLEMS (pain or burning when urinating, or frequent urination) *BOWEL PROBLEMS (unusual diarrhea, constipation, pain near the anus) TENDERNESS IN MOUTH AND THROAT WITH OR WITHOUT PRESENCE OF ULCERS (sore throat, sores in mouth, or a toothache) UNUSUAL RASH, SWELLING OR PAIN  UNUSUAL VAGINAL DISCHARGE OR ITCHING   Items with * indicate a potential emergency and should be followed up as soon as possible or go to the Emergency Department if any problems should occur.  Please show the CHEMOTHERAPY ALERT CARD or IMMUNOTHERAPY ALERT CARD  at check-in to the Emergency Department and triage nurse.  Should you have questions after your visit or need to cancel or reschedule your appointment, please contact [facility]COL[facility]  Dept: [phone]  and follow the prompts.  Office hours are 8:00 a.m. to 4:30 p.m. Monday - Friday. Please note that voicemails left after 4:00 p.m. may not be returned until the following business day.  We are closed weekends and major holidays. You have access to a nurse at all times for urgent questions. Please call the main number to the clinic Dept: [phone] and follow the prompts.   For any non-urgent questions, you may also contact your provider using MyChart. We now offer e-Visits for anyone 18 and older to request care online for non-urgent symptoms. For details visit [email or web address].   Also download the MyChart app! Go to the app store, search "MyChart", open the app, select [facility], and log in with your MyChart username and password.  Due to Covid, a mask is required upon entering the hospital/clinic. If you do not have a mask, one will be given to you upon arrival. For doctor visits, patients may have 1 support person aged 18 or older with them. For treatment visits, patients cannot have anyone with them due to current Covid guidelines and our immunocompromised population.   

## 2022-10-26 NOTE — Progress Notes (Signed)
Ok to treat with Elevated Scr per provider today.

## 2022-10-27 ENCOUNTER — Other Ambulatory Visit: Payer: Self-pay | Admitting: Physician Assistant

## 2022-10-27 DIAGNOSIS — E039 Hypothyroidism, unspecified: Secondary | ICD-10-CM

## 2022-10-27 MED ORDER — LEVOTHYROXINE SODIUM 88 MCG PO TABS: 88.0000 ug | ORAL_TABLET | Freq: Every day | ORAL | 2 refills | Status: DC

## 2022-10-31 ENCOUNTER — Other Ambulatory Visit: Payer: Self-pay | Admitting: Internal Medicine

## 2022-11-23 ENCOUNTER — Inpatient Hospital Stay (HOSPITAL_BASED_OUTPATIENT_CLINIC_OR_DEPARTMENT_OTHER): Payer: PPO | Admitting: Internal Medicine

## 2022-11-23 ENCOUNTER — Inpatient Hospital Stay: Payer: PPO | Attending: Internal Medicine

## 2022-11-23 ENCOUNTER — Other Ambulatory Visit: Payer: Self-pay

## 2022-11-23 ENCOUNTER — Inpatient Hospital Stay: Payer: PPO

## 2022-11-23 VITALS — BP 120/65 | HR 54 | Temp 97.4°F | Resp 16 | Wt 177.1 lb

## 2022-11-23 DIAGNOSIS — N189 Chronic kidney disease, unspecified: Secondary | ICD-10-CM | POA: Insufficient documentation

## 2022-11-23 DIAGNOSIS — C3490 Malignant neoplasm of unspecified part of unspecified bronchus or lung: Secondary | ICD-10-CM | POA: Diagnosis not present

## 2022-11-23 DIAGNOSIS — C3492 Malignant neoplasm of unspecified part of left bronchus or lung: Secondary | ICD-10-CM

## 2022-11-23 DIAGNOSIS — Z5112 Encounter for antineoplastic immunotherapy: Secondary | ICD-10-CM | POA: Diagnosis present

## 2022-11-23 DIAGNOSIS — Z923 Personal history of irradiation: Secondary | ICD-10-CM | POA: Insufficient documentation

## 2022-11-23 DIAGNOSIS — C3412 Malignant neoplasm of upper lobe, left bronchus or lung: Secondary | ICD-10-CM | POA: Diagnosis present

## 2022-11-23 DIAGNOSIS — Z79899 Other long term (current) drug therapy: Secondary | ICD-10-CM | POA: Diagnosis not present

## 2022-11-23 LAB — CBC WITH DIFFERENTIAL (CANCER CENTER ONLY)
Abs Immature Granulocytes: 0.01 10*3/uL (ref 0.00–0.07)
Basophils Absolute: 0 10*3/uL (ref 0.0–0.1)
Basophils Relative: 1 %
Eosinophils Absolute: 0.1 10*3/uL (ref 0.0–0.5)
Eosinophils Relative: 2 %
HCT: 38.6 % — ABNORMAL LOW (ref 39.0–52.0)
Hemoglobin: 13.7 g/dL (ref 13.0–17.0)
Immature Granulocytes: 0 %
Lymphocytes Relative: 25 %
Lymphs Abs: 1.1 10*3/uL (ref 0.7–4.0)
MCH: 33 pg (ref 26.0–34.0)
MCHC: 35.5 g/dL (ref 30.0–36.0)
MCV: 93 fL (ref 80.0–100.0)
Monocytes Absolute: 0.5 10*3/uL (ref 0.1–1.0)
Monocytes Relative: 11 %
Neutro Abs: 2.8 10*3/uL (ref 1.7–7.7)
Neutrophils Relative %: 61 %
Platelet Count: 135 10*3/uL — ABNORMAL LOW (ref 150–400)
RBC: 4.15 MIL/uL — ABNORMAL LOW (ref 4.22–5.81)
RDW: 12.7 % (ref 11.5–15.5)
WBC Count: 4.6 10*3/uL (ref 4.0–10.5)
nRBC: 0 % (ref 0.0–0.2)

## 2022-11-23 LAB — CMP (CANCER CENTER ONLY)
ALT: 16 U/L (ref 0–44)
AST: 16 U/L (ref 15–41)
Albumin: 3.5 g/dL (ref 3.5–5.0)
Alkaline Phosphatase: 53 U/L (ref 38–126)
Anion gap: 7 (ref 5–15)
BUN: 15 mg/dL (ref 8–23)
CO2: 26 mmol/L (ref 22–32)
Calcium: 9.3 mg/dL (ref 8.9–10.3)
Chloride: 107 mmol/L (ref 98–111)
Creatinine: 1.84 mg/dL — ABNORMAL HIGH (ref 0.61–1.24)
GFR, Estimated: 38 mL/min — ABNORMAL LOW (ref >60)
Glucose, Bld: 88 mg/dL (ref 70–99)
Potassium: 4 mmol/L (ref 3.5–5.1)
Sodium: 140 mmol/L (ref 135–145)
Total Bilirubin: 0.7 mg/dL (ref 0.3–1.2)
Total Protein: 6.9 g/dL (ref 6.5–8.1)

## 2022-11-23 LAB — TSH: TSH: 10.729 u[IU]/mL — ABNORMAL HIGH (ref 0.350–4.500)

## 2022-11-23 MED ORDER — SODIUM CHLORIDE 0.9 % IV SOLN: Freq: Once | INTRAVENOUS | Status: AC

## 2022-11-23 MED ORDER — SODIUM CHLORIDE 0.9 % IV SOLN
1500.0000 mg | Freq: Once | INTRAVENOUS | Status: AC
  Administered 2022-11-23: 1500 mg via INTRAVENOUS
  Filled 2022-11-23: qty 30

## 2022-11-23 NOTE — Progress Notes (Signed)
[place] [phone]   [phone]  OFFICE PROGRESS NOTE  Jack Major, MD [address] [place] [address]  DIAGNOSIS: Recurrent non-small cell lung cancer initially diagnosed as stage IA (T1b, N0, M0) non-small cell lung cancer, adenocarcinoma presented with left upper lobe lung nodule diagnosed in January 2020.  The patient has evidence for disease recurrence in AP window lymphadenopathy in December 2022.   There was insufficient material for the molecular studies by [facility].   PRIOR THERAPY:  1) Status post left lingulectomy with lymph node dissection under the care of Dr. Servando Snare on January 01, 2019. 2) Concurrent chemoradiation with carboplatin for an AUC of 2 and paclitaxel 45 mg per metered squared weekly.  First dose on 01/24/2022.  Status post 7 cycles. Last dose was given March 14, 2022   CURRENT THERAPY: Consolidation treatment with immunotherapy with Imfinzi 1500 Mg IV every 4 weeks.  First dose Apr 12, 2022.  Status post 8 cycles.  INTERVAL HISTORY: Jack Taylor 76 y.o. male returns to the clinic today for follow-up visit accompanied by his wife.  The patient is feeling fine today with no concerning complaints except for feeling sad today after he lost his younger brother last week.  He denied having any current chest pain, shortness of breath, cough or hemoptysis.  He has no nausea, vomiting, diarrhea or constipation.  He has no headache or visual changes.  He denied having any recent weight loss or night sweats.  He continues to tolerate his treatment with consolidation immunotherapy fairly well.  MEDICAL HISTORY: Past Medical History:  Diagnosis Date   Adenomatous colon polyp    Anxiety    Blood transfusion without reported diagnosis    Cancer ([facility])    Lung; 11/2018   CKD (chronic kidney disease)    stage III 11/09/18   COPD (chronic obstructive pulmonary disease) (HCC)    Gastric ulcer    GERD  (gastroesophageal reflux disease)    High cholesterol    History of radiation therapy    Left Lung- 02/17/22-03/18/22-Dr. Gery Pray   Hypertension    Pre-diabetes    A1c 6.2% 11/09/18    ALLERGIES:  has No Known Allergies.  MEDICATIONS:  Current Outpatient Medications  Medication Sig Dispense Refill   albuterol (PROVENTIL HFA;VENTOLIN HFA) 108 (90 Base) MCG/ACT inhaler Inhale 2 puffs into the lungs every 6 (six) hours as needed for wheezing or shortness of breath. Reported on 06/02/2016     amLODipine (NORVASC) 10 MG tablet Take 10 mg by mouth daily.      ANORO ELLIPTA 62.5-25 MCG/ACT AEPB Inhale 1 puff into the lungs daily.     diazepam (VALIUM) 2 MG tablet Take by mouth. (Patient not taking: Reported on 08/31/2022)     esomeprazole (NEXIUM) 40 MG capsule Take 40 mg by mouth daily.      folic acid (FOLVITE) 1 MG tablet Take by mouth. (Patient not taking: Reported on 08/31/2022)     levothyroxine (SYNTHROID) 88 MCG tablet Take 1 tablet (88 mcg total) by mouth daily before breakfast. 30 tablet 2   losartan (COZAAR) 50 MG tablet Take 50 mg by mouth daily.      OVER THE COUNTER MEDICATION Take 1 tablet by mouth daily as needed (for constipation).     prochlorperazine (COMPAZINE) 10 MG tablet TAKE 1 TABLET BY MOUTH EVERY 6 HOURS AS NEEDED 30 tablet 0   rosuvastatin (CRESTOR) 10 MG tablet Take 10  mg by mouth daily.     sucralfate (CARAFATE) 1 g tablet Take 1 tablet (1 g total) by mouth 4 (four) times daily -  with meals and at bedtime. Crush and dissolve in 10 mL of warm water prior to swallowing 120 tablet 1   traZODone (DESYREL) 50 MG tablet Take 50 mg by mouth at bedtime.     No current facility-administered medications for this visit.    SURGICAL HISTORY:  Past Surgical History:  Procedure Laterality Date   BRONCHIAL NEEDLE ASPIRATION BIOPSY  01/10/2022   Procedure: BRONCHIAL NEEDLE ASPIRATION BIOPSIES;  Surgeon: Collene Gobble, MD;  Location: [facility] ENDOSCOPY;  Service: Cardiopulmonary;;    COLONOSCOPY  2008   ESOPHAGOGASTRODUODENOSCOPY  11/23/2011   Procedure: ESOPHAGOGASTRODUODENOSCOPY (EGD);  Surgeon: Owens Loffler, MD;  Location: [facility] ENDOSCOPY;  Service: Endoscopy;  Laterality: N/A;   INTERCOSTAL NERVE BLOCK Left 01/01/2019   Procedure: INTERCOSTAL NERVE BLOCK;  Surgeon: Grace Isaac, MD;  Location: [facility];  Service: Thoracic;  Laterality: Left;   LUNG BIOPSY  12/18/2018   Procedure: LUNG BIOPSY - of Left Upper Lobe Biopsy of #7 Node Biopsy of #10L Node;  Surgeon: Grace Isaac, MD;  Location: [facility];  Service: Thoracic;;   LYMPH NODE DISSECTION Left 01/01/2019   Procedure: LYMPH NODE DISSECTION;  Surgeon: Grace Isaac, MD;  Location: [facility];  Service: Thoracic;  Laterality: Left;   UPPER GASTROINTESTINAL ENDOSCOPY  11/23/11   VIDEO ASSISTED THORACOSCOPY (VATS)/WEDGE RESECTION Left 01/01/2019   Procedure: VIDEO ASSISTED THORACOSCOPY (VATS)/ LEFT LINGULECTOMY;  Surgeon: Grace Isaac, MD;  Location: [facility];  Service: Thoracic;  Laterality: Left;   VIDEO BRONCHOSCOPY WITH ENDOBRONCHIAL NAVIGATION N/A 12/18/2018   Procedure: VIDEO BRONCHOSCOPY WITH ENDOBRONCHIAL NAVIGATION with Placement of Fiducial Markers x 3;  Surgeon: Grace Isaac, MD;  Location: [facility];  Service: Thoracic;  Laterality: N/A;   VIDEO BRONCHOSCOPY WITH ENDOBRONCHIAL ULTRASOUND N/A 12/18/2018   Procedure: VIDEO BRONCHOSCOPY WITH ENDOBRONCHIAL ULTRASOUND;  Surgeon: Grace Isaac, MD;  Location: [facility];  Service: Thoracic;  Laterality: N/A;   VIDEO BRONCHOSCOPY WITH ENDOBRONCHIAL ULTRASOUND Left 01/10/2022   Procedure: VIDEO BRONCHOSCOPY WITH ENDOBRONCHIAL ULTRASOUND;  Surgeon: Collene Gobble, MD;  Location: [facility];  Service: Cardiopulmonary;  Laterality: Left;    REVIEW OF SYSTEMS:  A comprehensive review of systems was negative.   PHYSICAL EXAMINATION: General appearance: alert, cooperative, and no distress Head: Normocephalic, without obvious abnormality, atraumatic Neck: no  adenopathy, no JVD, supple, symmetrical, trachea midline, and thyroid not enlarged, symmetric, no tenderness/mass/nodules Lymph nodes: Cervical, supraclavicular, and axillary nodes normal. Resp: clear to auscultation bilaterally Back: symmetric, no curvature. ROM normal. No CVA tenderness. Cardio: regular rate and rhythm, S1, S2 normal, no murmur, click, rub or gallop GI: soft, non-tender; bowel sounds normal; no masses,  no organomegaly Extremities: extremities normal, atraumatic, no cyanosis or edema  ECOG PERFORMANCE STATUS: 1 - Symptomatic but completely ambulatory  Blood pressure 120/65, pulse (!) 54, temperature (!) 97.4 F (36.3 C), resp. rate 16, weight 177 lb 1 oz (80.3 kg), SpO2 97 %.  LABORATORY DATA: Lab Results  Component Value Date   WBC 4.6 11/23/2022   HGB 13.7 11/23/2022   HCT 38.6 (L) 11/23/2022   MCV 93.0 11/23/2022   PLT 135 (L) 11/23/2022      Chemistry      Component Value Date/Time   NA 140 11/23/2022 0822   K 4.0 11/23/2022 0822   CL 107 11/23/2022 0822   CO2 26 11/23/2022 8182  BUN 15 11/23/2022 0822   CREATININE 1.84 (H) 11/23/2022 0822      Component Value Date/Time   CALCIUM 9.3 11/23/2022 0822   ALKPHOS 53 11/23/2022 0822   AST 16 11/23/2022 0822   ALT 16 11/23/2022 0822   BILITOT 0.7 11/23/2022 0822       RADIOGRAPHIC STUDIES: No results found.  ASSESSMENT AND PLAN: This is a very pleasant 76 years old white male diagnosed with recurrent non-small cell lung cancer that was initially diagnosed as stage IA (T1b, N0, M0) non-small cell lung cancer, adenocarcinoma presented with left upper lobe lung nodule in January 2020.  The patient has evidence for disease recurrence in AP window lymphadenopathy in December 2022.  He is status post left lingulectomy with lymph node dissection under the care of Dr. Servando Snare on January 01, 2019.  This was biopsy-proven to be recurrent adenocarcinoma of the lung. The patient underwent a course of concurrent  chemoradiation with weekly carboplatin for AUC of 2 and paclitaxel 45 Mg/M2 status post 7 cycles.  Last dose was given March 14, 2022.  The patient has partial response to this treatment. The patient is currently undergoing consolidation treatment with immunotherapy with Imfinzi 1500 Mg IV every 4 weeks status post 8 cycles.   The patient has been tolerating this treatment well with no concerning adverse effects. I recommended for him to proceed with cycle #9 today as planned. I will see him back for follow-up visit in 4 weeks for evaluation before starting cycle #10 with repeat CT scan of the chest for restaging of his disease. For the renal insufficiency he will follow with his primary care physician and nephrology. The patient was advised to call immediately if he has any other concerning symptoms in the interval. The patient voices understanding of current disease status and treatment options and is in agreement with the current care plan.  All questions were answered. The patient knows to call the clinic with any problems, questions or concerns. We can certainly see the patient much sooner if necessary.  Disclaimer: This note was dictated with voice recognition software. Similar sounding words can inadvertently be transcribed and may not be corrected upon review.

## 2022-11-23 NOTE — Patient Instructions (Signed)
[facility] ONCOLOGY  Discharge Instructions: Thank you for choosing [facility] to provide your oncology and hematology care.   If you have a lab appointment with the [facility], please go directly to the [facility] and check in at the registration area.   Wear comfortable clothing and clothing appropriate for easy access to any Portacath or PICC line.   We strive to give you quality time with your provider. You may need to reschedule your appointment if you arrive late (15 or more minutes).  Arriving late affects you and other patients whose appointments are after yours.  Also, if you miss three or more appointments without notifying the office, you may be dismissed from the clinic at the provider's discretion.      For prescription refill requests, have your pharmacy contact our office and allow 72 hours for refills to be completed.    Today you received the following chemotherapy and/or immunotherapy agents imfinzi       To help prevent nausea and vomiting after your treatment, we encourage you to take your nausea medication as directed.  BELOW ARE SYMPTOMS THAT SHOULD BE REPORTED IMMEDIATELY: *FEVER GREATER THAN 100.4 F (38 C) OR HIGHER *CHILLS OR SWEATING *NAUSEA AND VOMITING THAT IS NOT CONTROLLED WITH YOUR NAUSEA MEDICATION *UNUSUAL SHORTNESS OF BREATH *UNUSUAL BRUISING OR BLEEDING *URINARY PROBLEMS (pain or burning when urinating, or frequent urination) *BOWEL PROBLEMS (unusual diarrhea, constipation, pain near the anus) TENDERNESS IN MOUTH AND THROAT WITH OR WITHOUT PRESENCE OF ULCERS (sore throat, sores in mouth, or a toothache) UNUSUAL RASH, SWELLING OR PAIN  UNUSUAL VAGINAL DISCHARGE OR ITCHING   Items with * indicate a potential emergency and should be followed up as soon as possible or go to the Emergency Department if any problems should occur.  Please show the CHEMOTHERAPY ALERT CARD or IMMUNOTHERAPY ALERT CARD at check-in to  the Emergency Department and triage nurse.  Should you have questions after your visit or need to cancel or reschedule your appointment, please contact [facility]  Dept: [phone]  and follow the prompts.  Office hours are 8:00 a.m. to 4:30 p.m. Monday - Friday. Please note that voicemails left after 4:00 p.m. may not be returned until the following business day.  We are closed weekends and major holidays. You have access to a nurse at all times for urgent questions. Please call the main number to the clinic Dept: [phone] and follow the prompts.   For any non-urgent questions, you may also contact your provider using MyChart. We now offer e-Visits for anyone 26 and older to request care online for non-urgent symptoms. For details visit [email or web address].   Also download the MyChart app! Go to the app store, search "MyChart", open the app, select [facility], and log in with your MyChart username and password.  Masks are optional in the cancer centers. If you would like for your care team to wear a mask while they are taking care of you, please let them know. You may have one support person who is at least 76 years old accompany you for your appointments.

## 2022-11-23 NOTE — Progress Notes (Signed)
Per Dr. Julien Nordmann , it is ok to treat pt today with Durvalumab and creatinine of 1.84.

## 2022-11-24 LAB — T4: T4, Total: 10.4 ug/dL (ref 4.5–12.0)

## 2022-12-12 ENCOUNTER — Telehealth: Payer: Self-pay | Admitting: Internal Medicine

## 2022-12-12 NOTE — Telephone Encounter (Signed)
Called patient regarding upcoming January appointments, left a voicemail.

## 2022-12-19 ENCOUNTER — Ambulatory Visit (HOSPITAL_COMMUNITY)
Admission: RE | Admit: 2022-12-19 | Discharge: 2022-12-19 | Disposition: A | Discharge status: Home / Self Care | Payer: PPO | Source: Ambulatory Visit | Attending: Internal Medicine | Admitting: Internal Medicine

## 2022-12-19 ENCOUNTER — Encounter (HOSPITAL_COMMUNITY): Payer: Self-pay

## 2022-12-19 DIAGNOSIS — C3490 Malignant neoplasm of unspecified part of unspecified bronchus or lung: Secondary | ICD-10-CM | POA: Insufficient documentation

## 2022-12-21 ENCOUNTER — Inpatient Hospital Stay (HOSPITAL_BASED_OUTPATIENT_CLINIC_OR_DEPARTMENT_OTHER): Payer: PPO | Admitting: Internal Medicine

## 2022-12-21 ENCOUNTER — Inpatient Hospital Stay: Payer: PPO

## 2022-12-21 ENCOUNTER — Inpatient Hospital Stay: Payer: PPO | Attending: Internal Medicine

## 2022-12-21 ENCOUNTER — Other Ambulatory Visit: Payer: Self-pay

## 2022-12-21 VITALS — BP 121/63 | HR 49 | Temp 97.7°F | Resp 18

## 2022-12-21 DIAGNOSIS — Z79899 Other long term (current) drug therapy: Secondary | ICD-10-CM | POA: Insufficient documentation

## 2022-12-21 DIAGNOSIS — Z923 Personal history of irradiation: Secondary | ICD-10-CM | POA: Insufficient documentation

## 2022-12-21 DIAGNOSIS — C3412 Malignant neoplasm of upper lobe, left bronchus or lung: Secondary | ICD-10-CM | POA: Diagnosis present

## 2022-12-21 DIAGNOSIS — I700 Atherosclerosis of aorta: Secondary | ICD-10-CM | POA: Insufficient documentation

## 2022-12-21 DIAGNOSIS — Z5112 Encounter for antineoplastic immunotherapy: Secondary | ICD-10-CM | POA: Insufficient documentation

## 2022-12-21 DIAGNOSIS — C3492 Malignant neoplasm of unspecified part of left bronchus or lung: Secondary | ICD-10-CM | POA: Diagnosis not present

## 2022-12-21 DIAGNOSIS — J449 Chronic obstructive pulmonary disease, unspecified: Secondary | ICD-10-CM | POA: Diagnosis not present

## 2022-12-21 DIAGNOSIS — R591 Generalized enlarged lymph nodes: Secondary | ICD-10-CM | POA: Insufficient documentation

## 2022-12-21 DIAGNOSIS — K449 Diaphragmatic hernia without obstruction or gangrene: Secondary | ICD-10-CM | POA: Diagnosis not present

## 2022-12-21 DIAGNOSIS — Z9221 Personal history of antineoplastic chemotherapy: Secondary | ICD-10-CM | POA: Diagnosis not present

## 2022-12-21 LAB — CBC WITH DIFFERENTIAL (CANCER CENTER ONLY)
Abs Immature Granulocytes: 0.01 10*3/uL (ref 0.00–0.07)
Basophils Absolute: 0 10*3/uL (ref 0.0–0.1)
Basophils Relative: 1 %
Eosinophils Absolute: 0.1 10*3/uL (ref 0.0–0.5)
Eosinophils Relative: 2 %
HCT: 38.7 % — ABNORMAL LOW (ref 39.0–52.0)
Hemoglobin: 13.8 g/dL (ref 13.0–17.0)
Immature Granulocytes: 0 %
Lymphocytes Relative: 28 %
Lymphs Abs: 1.3 10*3/uL (ref 0.7–4.0)
MCH: 33.1 pg (ref 26.0–34.0)
MCHC: 35.7 g/dL (ref 30.0–36.0)
MCV: 92.8 fL (ref 80.0–100.0)
Monocytes Absolute: 0.5 10*3/uL (ref 0.1–1.0)
Monocytes Relative: 11 %
Neutro Abs: 2.8 10*3/uL (ref 1.7–7.7)
Neutrophils Relative %: 58 %
Platelet Count: 137 10*3/uL — ABNORMAL LOW (ref 150–400)
RBC: 4.17 MIL/uL — ABNORMAL LOW (ref 4.22–5.81)
RDW: 12.7 % (ref 11.5–15.5)
WBC Count: 4.8 10*3/uL (ref 4.0–10.5)
nRBC: 0 % (ref 0.0–0.2)

## 2022-12-21 LAB — CMP (CANCER CENTER ONLY)
ALT: 14 U/L (ref 0–44)
AST: 12 U/L — ABNORMAL LOW (ref 15–41)
Albumin: 3.8 g/dL (ref 3.5–5.0)
Alkaline Phosphatase: 56 U/L (ref 38–126)
Anion gap: 4 — ABNORMAL LOW (ref 5–15)
BUN: 14 mg/dL (ref 8–23)
CO2: 27 mmol/L (ref 22–32)
Calcium: 9.2 mg/dL (ref 8.9–10.3)
Chloride: 108 mmol/L (ref 98–111)
Creatinine: 1.73 mg/dL — ABNORMAL HIGH (ref 0.61–1.24)
GFR, Estimated: 40 mL/min — ABNORMAL LOW (ref >60)
Glucose, Bld: 77 mg/dL (ref 70–99)
Potassium: 4.1 mmol/L (ref 3.5–5.1)
Sodium: 139 mmol/L (ref 135–145)
Total Bilirubin: 0.5 mg/dL (ref 0.3–1.2)
Total Protein: 6.3 g/dL — ABNORMAL LOW (ref 6.5–8.1)

## 2022-12-21 LAB — TSH: TSH: 13.789 u[IU]/mL — ABNORMAL HIGH (ref 0.350–4.500)

## 2022-12-21 MED ORDER — SODIUM CHLORIDE 0.9 % IV SOLN
1500.0000 mg | Freq: Once | INTRAVENOUS | Status: AC
  Administered 2022-12-21: 1500 mg via INTRAVENOUS
  Filled 2022-12-21: qty 30

## 2022-12-21 MED ORDER — SODIUM CHLORIDE 0.9 % IV SOLN: Freq: Once | INTRAVENOUS | Status: AC

## 2022-12-21 NOTE — Progress Notes (Signed)
[facility] Telephone:([phone]) [phone]   Fax:([phone]  OFFICE PROGRESS NOTE  Gwenlyn Found, MD [address] [place] [address]  DIAGNOSIS: Recurrent non-small cell lung cancer initially diagnosed as stage IA (T1b, N0, M0) non-small cell lung cancer, adenocarcinoma presented with left upper lobe lung nodule diagnosed in January 2020.  The patient has evidence for disease recurrence in AP window lymphadenopathy in December 2022.   There was insufficient material for the molecular studies by [facility].   PRIOR THERAPY:  1) Status post left lingulectomy with lymph node dissection under the care of Dr. Tyrone Sage on January 01, 2019. 2) Concurrent chemoradiation with carboplatin for an AUC of 2 and paclitaxel 45 mg per metered squared weekly.  First dose on 01/24/2022.  Status post 7 cycles. Last dose was given March 14, 2022   CURRENT THERAPY: Consolidation treatment with immunotherapy with Imfinzi 1500 Mg IV every 4 weeks.  First dose Apr 12, 2022.  Status post 9 cycles.  INTERVAL HISTORY: Jack Taylor 77 y.o. male returns to the clinic today for follow-up visit accompanied by his wife.  The patient is feeling fine today with no concerning complaints.  He denied having any current chest pain, shortness of breath, cough or hemoptysis.  He has no nausea, vomiting, diarrhea or constipation.  He has no headache or visual changes.  He has no skin rash.  He has been tolerating his treatment with Imfinzi fairly well.  The patient had repeat CT scan of the chest performed recently and he is here for evaluation and discussion of his discuss results before starting cycle #10.   MEDICAL HISTORY: Past Medical History:  Diagnosis Date   Adenomatous colon polyp    Anxiety    Blood transfusion without reported diagnosis    Cancer (HCC)    Lung; 11/2018   CKD (chronic kidney disease)    stage III 11/09/18   COPD (chronic obstructive pulmonary disease) (HCC)     Gastric ulcer    GERD (gastroesophageal reflux disease)    High cholesterol    History of radiation therapy    Left Lung- 02/17/22-03/18/22-Dr. Antony Blackbird   Hypertension    Pre-diabetes    A1c 6.2% 11/09/18    ALLERGIES:  has No Known Allergies.  MEDICATIONS:  Current Outpatient Medications  Medication Sig Dispense Refill   albuterol (PROVENTIL HFA;VENTOLIN HFA) 108 (90 Base) MCG/ACT inhaler Inhale 2 puffs into the lungs every 6 (six) hours as needed for wheezing or shortness of breath. Reported on 06/02/2016     amLODipine (NORVASC) 10 MG tablet Take 10 mg by mouth daily.      ANORO ELLIPTA 62.5-25 MCG/ACT AEPB Inhale 1 puff into the lungs daily.     diazepam (VALIUM) 2 MG tablet Take by mouth. (Patient not taking: Reported on 08/31/2022)     esomeprazole (NEXIUM) 40 MG capsule Take 40 mg by mouth daily.      folic acid (FOLVITE) 1 MG tablet Take by mouth. (Patient not taking: Reported on 08/31/2022)     levothyroxine (SYNTHROID) 88 MCG tablet Take 1 tablet (88 mcg total) by mouth daily before breakfast. 30 tablet 2   losartan (COZAAR) 50 MG tablet Take 50 mg by mouth daily.      OVER THE COUNTER MEDICATION Take 1 tablet by mouth daily as needed (for constipation).     prochlorperazine (COMPAZINE) 10 MG tablet TAKE 1 TABLET BY MOUTH EVERY 6 HOURS AS NEEDED 30 tablet  0   rosuvastatin (CRESTOR) 10 MG tablet Take 10 mg by mouth daily.     sucralfate (CARAFATE) 1 g tablet Take 1 tablet (1 g total) by mouth 4 (four) times daily -  with meals and at bedtime. Crush and dissolve in 10 mL of warm water prior to swallowing 120 tablet 1   traZODone (DESYREL) 50 MG tablet Take 50 mg by mouth at bedtime.     No current facility-administered medications for this visit.    SURGICAL HISTORY:  Past Surgical History:  Procedure Laterality Date   BRONCHIAL NEEDLE ASPIRATION BIOPSY  01/10/2022   Procedure: BRONCHIAL NEEDLE ASPIRATION BIOPSIES;  Surgeon: Collene Gobble, MD;  Location: [facility] ENDOSCOPY;   Service: Cardiopulmonary;;   COLONOSCOPY  2008   ESOPHAGOGASTRODUODENOSCOPY  11/23/2011   Procedure: ESOPHAGOGASTRODUODENOSCOPY (EGD);  Surgeon: Owens Loffler, MD;  Location: [facility] ENDOSCOPY;  Service: Endoscopy;  Laterality: N/A;   INTERCOSTAL NERVE BLOCK Left 01/01/2019   Procedure: INTERCOSTAL NERVE BLOCK;  Surgeon: Grace Isaac, MD;  Location: [place];  Service: Thoracic;  Laterality: Left;   LUNG BIOPSY  12/18/2018   Procedure: LUNG BIOPSY - of Left Upper Lobe Biopsy of #7 Node Biopsy of #10L Node;  Surgeon: Grace Isaac, MD;  Location: [place];  Service: Thoracic;;   LYMPH NODE DISSECTION Left 01/01/2019   Procedure: LYMPH NODE DISSECTION;  Surgeon: Grace Isaac, MD;  Location: [place];  Service: Thoracic;  Laterality: Left;   UPPER GASTROINTESTINAL ENDOSCOPY  11/23/11   VIDEO ASSISTED THORACOSCOPY (VATS)/WEDGE RESECTION Left 01/01/2019   Procedure: VIDEO ASSISTED THORACOSCOPY (VATS)/ LEFT LINGULECTOMY;  Surgeon: Grace Isaac, MD;  Location: [place];  Service: Thoracic;  Laterality: Left;   VIDEO BRONCHOSCOPY WITH ENDOBRONCHIAL NAVIGATION N/A 12/18/2018   Procedure: VIDEO BRONCHOSCOPY WITH ENDOBRONCHIAL NAVIGATION with Placement of Fiducial Markers x 3;  Surgeon: Grace Isaac, MD;  Location: [place];  Service: Thoracic;  Laterality: N/A;   VIDEO BRONCHOSCOPY WITH ENDOBRONCHIAL ULTRASOUND N/A 12/18/2018   Procedure: VIDEO BRONCHOSCOPY WITH ENDOBRONCHIAL ULTRASOUND;  Surgeon: Grace Isaac, MD;  Location: [place];  Service: Thoracic;  Laterality: N/A;   VIDEO BRONCHOSCOPY WITH ENDOBRONCHIAL ULTRASOUND Left 01/10/2022   Procedure: VIDEO BRONCHOSCOPY WITH ENDOBRONCHIAL ULTRASOUND;  Surgeon: Collene Gobble, MD;  Location: [place];  Service: Cardiopulmonary;  Laterality: Left;    REVIEW OF SYSTEMS:  Constitutional: negative Eyes: negative Ears, nose, mouth, throat, and face: negative Respiratory: negative Cardiovascular: negative Gastrointestinal:  negative Genitourinary:negative Integument/breast: negative Hematologic/lymphatic: negative Musculoskeletal:negative Neurological: negative Behavioral/Psych: negative Endocrine: negative Allergic/Immunologic: negative   PHYSICAL EXAMINATION: General appearance: alert, cooperative, and no distress Head: Normocephalic, without obvious abnormality, atraumatic Neck: no adenopathy, no JVD, supple, symmetrical, trachea midline, and thyroid not enlarged, symmetric, no tenderness/mass/nodules Lymph nodes: Cervical, supraclavicular, and axillary nodes normal. Resp: clear to auscultation bilaterally Back: symmetric, no curvature. ROM normal. No CVA tenderness. Cardio: regular rate and rhythm, S1, S2 normal, no murmur, click, rub or gallop GI: soft, non-tender; bowel sounds normal; no masses,  no organomegaly Extremities: extremities normal, atraumatic, no cyanosis or edema Neurologic: Alert and oriented X 3, normal strength and tone. Normal symmetric reflexes. Normal coordination and gait  ECOG PERFORMANCE STATUS: 1 - Symptomatic but completely ambulatory  Blood pressure (!) 112/56, pulse (!) 53, temperature 97.6 F (36.4 C), temperature source Oral, resp. rate 18, weight 176 lb (79.8 kg), SpO2 97 %.  LABORATORY DATA: Lab Results  Component Value Date   WBC 4.8 12/21/2022   HGB 13.8 12/21/2022   HCT 38.7 (  L) 12/21/2022   MCV 92.8 12/21/2022   PLT 137 (L) 12/21/2022      Chemistry      Component Value Date/Time   NA 139 12/21/2022 0933   K 4.1 12/21/2022 0933   CL 108 12/21/2022 0933   CO2 27 12/21/2022 0933   BUN 14 12/21/2022 0933   CREATININE 1.73 (H) 12/21/2022 0933      Component Value Date/Time   CALCIUM 9.2 12/21/2022 0933   ALKPHOS 56 12/21/2022 0933   AST 12 (L) 12/21/2022 0933   ALT 14 12/21/2022 0933   BILITOT 0.5 12/21/2022 0933       RADIOGRAPHIC STUDIES: CT Chest Wo Contrast  Result Date: 12/19/2022 CLINICAL DATA:  Non-small cell lung cancer with  lymphadenopathy. Chemotherapy and radiation therapy completed in April 2023. Ongoing immunotherapy. * Tracking Code: BO * EXAM: CT CHEST WITHOUT CONTRAST TECHNIQUE: Multidetector CT imaging of the chest was performed following the standard protocol without IV contrast. RADIATION DOSE REDUCTION: This exam was performed according to the departmental dose-optimization program which includes automated exposure control, adjustment of the mA and/or kV according to patient size and/or use of iterative reconstruction technique. COMPARISON:  09/23/2022 and 11/26/2020. FINDINGS: Cardiovascular: Atherosclerotic calcification of the aorta, aortic valve and coronary arteries. Heart is at the upper limits of normal in size. No pericardial effusion. Mediastinum/Nodes: No pathologically enlarged mediastinal or axillary lymph nodes. Hilar regions are difficult to definitively evaluate without IV contrast. Esophagus is grossly unremarkable. Lungs/Pleura: A few scattered pulmonary nodules measure up to 4 mm in the posteromedial right upper lobe, unchanged from 11/26/2020. No specific follow-up necessary. Postoperative and post treatment scarring in the lingula, unchanged. Linear scarring in the right lower lobe. No new pulmonary nodules. No pleural fluid. Minimal adherent debris in the airway. Upper Abdomen: Visualized portions of the liver, gallbladder, adrenal glands, kidneys, spleen, pancreas, stomach and bowel are unremarkable with the exception of a tiny hiatal hernia. No upper abdominal adenopathy. Musculoskeletal: Degenerative changes in the spine. No worrisome lytic or sclerotic lesions. IMPRESSION: 1. No evidence of recurrent or metastatic disease. 2. Aortic atherosclerosis (ICD10-I70.0). Coronary artery calcification. Electronically Signed   By: Leanna Battles M.D.   On: 12/19/2022 10:04    ASSESSMENT AND PLAN: This is a very pleasant 77 years old white male diagnosed with recurrent non-small cell lung cancer that was  initially diagnosed as stage IA (T1b, N0, M0) non-small cell lung cancer, adenocarcinoma presented with left upper lobe lung nodule in January 2020.  The patient has evidence for disease recurrence in AP window lymphadenopathy in December 2022.  He is status post left lingulectomy with lymph node dissection under the care of Dr. Tyrone Sage on January 01, 2019.  This was biopsy-proven to be recurrent adenocarcinoma of the lung. The patient underwent a course of concurrent chemoradiation with weekly carboplatin for AUC of 2 and paclitaxel 45 Mg/M2 status post 7 cycles.  Last dose was given March 14, 2022.  The patient has partial response to this treatment. The patient is currently undergoing consolidation treatment with immunotherapy with Imfinzi 1500 Mg IV every 4 weeks status post 9 cycles.   He has been tolerating this treatment well with no concerning adverse effects. He had repeat CT scan of the chest performed recently.  I personally and independently reviewed the scan and discussed the result with the patient and his wife. His scan showed no concerning findings for disease recurrence or metastasis. I recommended for him to continue his current consolidation treatment with Imfinzi and  he will proceed with cycle #10 today. He will come back for follow-up visit in 4 weeks for evaluation before the next cycle of his treatment. For the renal insufficiency, he is followed by his nephrologist. The patient was advised to call immediately if he has any concerning symptoms in the interval.  The patient voices understanding of current disease status and treatment options and is in agreement with the current care plan.  All questions were answered. The patient knows to call the clinic with any problems, questions or concerns. We can certainly see the patient much sooner if necessary.  Disclaimer: This note was dictated with voice recognition software. Similar sounding words can inadvertently be transcribed  and may not be corrected upon review.

## 2022-12-21 NOTE — Patient Instructions (Signed)
[facility]  Discharge Instructions: Thank you for choosing [facility] to provide your oncology and hematology care.   If you have a lab appointment with the Cancer Center, please go directly to the Cancer Center and check in at the registration area.   Wear comfortable clothing and clothing appropriate for easy access to any Portacath or PICC line.   We strive to give you quality time with your provider. You may need to reschedule your appointment if you arrive late (15 or more minutes).  Arriving late affects you and other patients whose appointments are after yours.  Also, if you miss three or more appointments without notifying the office, you may be dismissed from the clinic at the provider's discretion.      For prescription refill requests, have your pharmacy contact our office and allow 72 hours for refills to be completed.    Today you received the following chemotherapy and/or immunotherapy agents: imfinzi       To help prevent nausea and vomiting after your treatment, we encourage you to take your nausea medication as directed.  BELOW ARE SYMPTOMS THAT SHOULD BE REPORTED IMMEDIATELY: *FEVER GREATER THAN 100.4 F (38 C) OR HIGHER *CHILLS OR SWEATING *NAUSEA AND VOMITING THAT IS NOT CONTROLLED WITH YOUR NAUSEA MEDICATION *UNUSUAL SHORTNESS OF BREATH *UNUSUAL BRUISING OR BLEEDING *URINARY PROBLEMS (pain or burning when urinating, or frequent urination) *BOWEL PROBLEMS (unusual diarrhea, constipation, pain near the anus) TENDERNESS IN MOUTH AND THROAT WITH OR WITHOUT PRESENCE OF ULCERS (sore throat, sores in mouth, or a toothache) UNUSUAL RASH, SWELLING OR PAIN  UNUSUAL VAGINAL DISCHARGE OR ITCHING   Items with * indicate a potential emergency and should be followed up as soon as possible or go to the Emergency Department if any problems should occur.  Please show the CHEMOTHERAPY ALERT CARD or IMMUNOTHERAPY ALERT CARD at check-in to  the Emergency Department and triage nurse.  Should you have questions after your visit or need to cancel or reschedule your appointment, please contact [facility]  Dept: [phone]  and follow the prompts.  Office hours are 8:00 a.m. to 4:30 p.m. Monday - Friday. Please note that voicemails left after 4:00 p.m. may not be returned until the following business day.  We are closed weekends and major holidays. You have access to a nurse at all times for urgent questions. Please call the main number to the clinic Dept: [phone] and follow the prompts.   For any non-urgent questions, you may also contact your provider using MyChart. We now offer e-Visits for anyone 55 and older to request care online for non-urgent symptoms. For details visit [email or web address].   Also download the MyChart app! Go to the app store, search "MyChart", open the app, select [facility], and log in with your MyChart username and password.  Masks are optional in the cancer centers. If you would like for your care team to wear a mask while they are taking care of you, please let them know. You may have one support person who is at least 77 years old accompany you for your appointments.

## 2022-12-21 NOTE — Progress Notes (Signed)
D1/C10 Imfinzi. Creatinine 1.73. OK to treat per Dr. Arbutus Ped

## 2023-01-13 ENCOUNTER — Other Ambulatory Visit: Payer: Self-pay

## 2023-01-14 NOTE — Progress Notes (Unsigned)
[place] OFFICE PROGRESS NOTE  [name], MD [address] [place]  [address]  DIAGNOSIS: Recurrent non-small cell lung cancer initially diagnosed as stage IA (T1b, N0, M0) non-small cell lung cancer, adenocarcinoma presented with left upper lobe lung nodule diagnosed in January 2020.  The patient has evidence for disease recurrence in AP window lymphadenopathy in December 2022.   There was insufficient material for the molecular studies by [facility].  PRIOR THERAPY: 1) Status post left lingulectomy with lymph node dissection under the care of Dr. Servando Snare on January 01, 2019 2) Concurrent chemoradiation with carboplatin for an AUC of 2 and paclitaxel 45 mg per metered squared weekly.  First dose on 01/24/2022.  Status post 7 cycles. Last dose was given March 14, 2022  CURRENT THERAPY: Consolidation treatment with immunotherapy with Imfinzi 1500 Mg IV every 4 weeks.  First dose Apr 12, 2022.  Status post 10 cycles.    INTERVAL HISTORY: Jack Taylor 77 y.o. male returns to the clinic today for a follow-up visit accompanied by his wife.  The patient is feeling well currently well today without any concerning complaints except for ***.  The patient is currently undergoing immunotherapy with Imfinzi and is tolerating well without any concerning adverse side effects except for thyroid dysfunction. He denies any recent fever, chills, night sweats, or weight loss.  Denies any chest pain, cough, or hemoptysis.  He reports he has baseline dyspnea on exertion. He denies any nausea, vomiting, or diarrhea. If he has constipation, his wife will give him a stool softener. He denies any headache or visual changes.  Denies any rashes or skin changes. He is here today for evaluation before starting cycle #11.    MEDICAL HISTORY: Past Medical History:  Diagnosis Date   Adenomatous colon polyp    Anxiety    Blood transfusion without reported diagnosis    Cancer  ([place])    Lung; 11/2018   CKD (chronic kidney disease)    stage III 11/09/18   COPD (chronic obstructive pulmonary disease) (HCC)    Gastric ulcer    GERD (gastroesophageal reflux disease)    High cholesterol    History of radiation therapy    Left Lung- 02/17/22-03/18/22-Dr. Gery Pray   Hypertension    Pre-diabetes    A1c 6.2% 11/09/18    ALLERGIES:  has No Known Allergies.  MEDICATIONS:  Current Outpatient Medications  Medication Sig Dispense Refill   albuterol (PROVENTIL HFA;VENTOLIN HFA) 108 (90 Base) MCG/ACT inhaler Inhale 2 puffs into the lungs every 6 (six) hours as needed for wheezing or shortness of breath. Reported on 06/02/2016     amLODipine (NORVASC) 10 MG tablet Take 10 mg by mouth daily.      ANORO ELLIPTA 62.5-25 MCG/ACT AEPB Inhale 1 puff into the lungs daily.     diazepam (VALIUM) 2 MG tablet Take by mouth. (Patient not taking: Reported on 08/31/2022)     esomeprazole (NEXIUM) 40 MG capsule Take 40 mg by mouth daily.      folic acid (FOLVITE) 1 MG tablet Take by mouth. (Patient not taking: Reported on 08/31/2022)     levothyroxine (SYNTHROID) 88 MCG tablet Take 1 tablet (88 mcg total) by mouth daily before breakfast. 30 tablet 2   losartan (COZAAR) 50 MG tablet Take 50 mg by mouth daily.      OVER THE COUNTER MEDICATION Take 1 tablet by mouth daily as needed (for constipation).     prochlorperazine (COMPAZINE) 10 MG  tablet TAKE 1 TABLET BY MOUTH EVERY 6 HOURS AS NEEDED 30 tablet 0   rosuvastatin (CRESTOR) 10 MG tablet Take 10 mg by mouth daily.     sucralfate (CARAFATE) 1 g tablet Take 1 tablet (1 g total) by mouth 4 (four) times daily -  with meals and at bedtime. Crush and dissolve in 10 mL of warm water prior to swallowing 120 tablet 1   traZODone (DESYREL) 50 MG tablet Take 50 mg by mouth at bedtime.     No current facility-administered medications for this visit.    SURGICAL HISTORY:  Past Surgical History:  Procedure Laterality Date   BRONCHIAL NEEDLE  ASPIRATION BIOPSY  01/10/2022   Procedure: BRONCHIAL NEEDLE ASPIRATION BIOPSIES;  Surgeon: Collene Gobble, MD;  Location: [facility] ENDOSCOPY;  Service: Cardiopulmonary;;   COLONOSCOPY  2008   ESOPHAGOGASTRODUODENOSCOPY  11/23/2011   Procedure: ESOPHAGOGASTRODUODENOSCOPY (EGD);  Surgeon: Owens Loffler, MD;  Location: [facility] ENDOSCOPY;  Service: Endoscopy;  Laterality: N/A;   INTERCOSTAL NERVE BLOCK Left 01/01/2019   Procedure: INTERCOSTAL NERVE BLOCK;  Surgeon: Grace Isaac, MD;  Location: [place];  Service: Thoracic;  Laterality: Left;   LUNG BIOPSY  12/18/2018   Procedure: LUNG BIOPSY - of Left Upper Lobe Biopsy of #7 Node Biopsy of #10L Node;  Surgeon: Grace Isaac, MD;  Location: [place];  Service: Thoracic;;   LYMPH NODE DISSECTION Left 01/01/2019   Procedure: LYMPH NODE DISSECTION;  Surgeon: Grace Isaac, MD;  Location: [place];  Service: Thoracic;  Laterality: Left;   UPPER GASTROINTESTINAL ENDOSCOPY  11/23/11   VIDEO ASSISTED THORACOSCOPY (VATS)/WEDGE RESECTION Left 01/01/2019   Procedure: VIDEO ASSISTED THORACOSCOPY (VATS)/ LEFT LINGULECTOMY;  Surgeon: Grace Isaac, MD;  Location: [place];  Service: Thoracic;  Laterality: Left;   VIDEO BRONCHOSCOPY WITH ENDOBRONCHIAL NAVIGATION N/A 12/18/2018   Procedure: VIDEO BRONCHOSCOPY WITH ENDOBRONCHIAL NAVIGATION with Placement of Fiducial Markers x 3;  Surgeon: Grace Isaac, MD;  Location: [place];  Service: Thoracic;  Laterality: N/A;   VIDEO BRONCHOSCOPY WITH ENDOBRONCHIAL ULTRASOUND N/A 12/18/2018   Procedure: VIDEO BRONCHOSCOPY WITH ENDOBRONCHIAL ULTRASOUND;  Surgeon: Grace Isaac, MD;  Location: [place];  Service: Thoracic;  Laterality: N/A;   VIDEO BRONCHOSCOPY WITH ENDOBRONCHIAL ULTRASOUND Left 01/10/2022   Procedure: VIDEO BRONCHOSCOPY WITH ENDOBRONCHIAL ULTRASOUND;  Surgeon: Collene Gobble, MD;  Location: [place];  Service: Cardiopulmonary;  Laterality: Left;    REVIEW OF SYSTEMS:   Review of Systems  Constitutional:  Negative for appetite change, chills, fatigue, fever and unexpected weight change.  HENT:   Negative for mouth sores, nosebleeds, sore throat and trouble swallowing.   Eyes: Negative for eye problems and icterus.  Respiratory: Negative for cough, hemoptysis, shortness of breath and wheezing.   Cardiovascular: Negative for chest pain and leg swelling.  Gastrointestinal: Negative for abdominal pain, constipation, diarrhea, nausea and vomiting.  Genitourinary: Negative for bladder incontinence, difficulty urinating, dysuria, frequency and hematuria.   Musculoskeletal: Negative for back pain, gait problem, neck pain and neck stiffness.  Skin: Negative for itching and rash.  Neurological: Negative for dizziness, extremity weakness, gait problem, headaches, light-headedness and seizures.  Hematological: Negative for adenopathy. Does not bruise/bleed easily.  Psychiatric/Behavioral: Negative for confusion, depression and sleep disturbance. The patient is not nervous/anxious.     PHYSICAL EXAMINATION:  There were no vitals taken for this visit.  ECOG PERFORMANCE STATUS: {CHL ONC ECOG [id number]  Physical Exam  Constitutional: Oriented to person, place, and time and well-developed, well-nourished, and in no distress. No  distress.  HENT:  Head: Normocephalic and atraumatic.  Mouth/Throat: Oropharynx is clear and moist. No oropharyngeal exudate.  Eyes: Conjunctivae are normal. Right eye exhibits no discharge. Left eye exhibits no discharge. No scleral icterus.  Neck: Normal range of motion. Neck supple.  Cardiovascular: Normal rate, regular rhythm, normal heart sounds and intact distal pulses.   Pulmonary/Chest: Effort normal and breath sounds normal. No respiratory distress. No wheezes. No rales.  Abdominal: Soft. Bowel sounds are normal. Exhibits no distension and no mass. There is no tenderness.  Musculoskeletal: Normal range of motion. Exhibits no edema.  Lymphadenopathy:    No cervical  adenopathy.  Neurological: Alert and oriented to person, place, and time. Exhibits normal muscle tone. Gait normal. Coordination normal.  Skin: Skin is warm and dry. No rash noted. Not diaphoretic. No erythema. No pallor.  Psychiatric: Mood, memory and judgment normal.  Vitals reviewed.  LABORATORY DATA: Lab Results  Component Value Date   WBC 4.8 12/21/2022   HGB 13.8 12/21/2022   HCT 38.7 (L) 12/21/2022   MCV 92.8 12/21/2022   PLT 137 (L) 12/21/2022      Chemistry      Component Value Date/Time   NA 139 12/21/2022 0933   K 4.1 12/21/2022 0933   CL 108 12/21/2022 0933   CO2 27 12/21/2022 0933   BUN 14 12/21/2022 0933   CREATININE 1.73 (H) 12/21/2022 0933      Component Value Date/Time   CALCIUM 9.2 12/21/2022 0933   ALKPHOS 56 12/21/2022 0933   AST 12 (L) 12/21/2022 0933   ALT 14 12/21/2022 0933   BILITOT 0.5 12/21/2022 0933       RADIOGRAPHIC STUDIES:  CT Chest Wo Contrast  Result Date: 12/19/2022 CLINICAL DATA:  Non-small cell lung cancer with lymphadenopathy. Chemotherapy and radiation therapy completed in April 2023. Ongoing immunotherapy. * Tracking Code: BO * EXAM: CT CHEST WITHOUT CONTRAST TECHNIQUE: Multidetector CT imaging of the chest was performed following the standard protocol without IV contrast. RADIATION DOSE REDUCTION: This exam was performed according to the departmental dose-optimization program which includes automated exposure control, adjustment of the mA and/or kV according to patient size and/or use of iterative reconstruction technique. COMPARISON:  09/23/2022 and 11/26/2020. FINDINGS: Cardiovascular: Atherosclerotic calcification of the aorta, aortic valve and coronary arteries. Heart is at the upper limits of normal in size. No pericardial effusion. Mediastinum/Nodes: No pathologically enlarged mediastinal or axillary lymph nodes. Hilar regions are difficult to definitively evaluate without IV contrast. Esophagus is grossly unremarkable.  Lungs/Pleura: A few scattered pulmonary nodules measure up to 4 mm in the posteromedial right upper lobe, unchanged from 11/26/2020. No specific follow-up necessary. Postoperative and post treatment scarring in the lingula, unchanged. Linear scarring in the right lower lobe. No new pulmonary nodules. No pleural fluid. Minimal adherent debris in the airway. Upper Abdomen: Visualized portions of the liver, gallbladder, adrenal glands, kidneys, spleen, pancreas, stomach and bowel are unremarkable with the exception of a tiny hiatal hernia. No upper abdominal adenopathy. Musculoskeletal: Degenerative changes in the spine. No worrisome lytic or sclerotic lesions. IMPRESSION: 1. No evidence of recurrent or metastatic disease. 2. Aortic atherosclerosis (ICD10-I70.0). Coronary artery calcification. Electronically Signed   By: Lorin Picket M.D.   On: 12/19/2022 10:04     ASSESSMENT/PLAN:  This is a very pleasant 77 year old Caucasian male diagnosed with recurrent non-small cell lung cancer that was initially diagnosed as a stage Ia (T1b, N0, M0) non-small cell lung cancer, adenocarcinoma.  He initially presented with a left upper lobe lung  nodule in January 2020.  He had evidence of disease recurrence with an AP window lymphadenopathy in December 2022.  He is status post left lingulectomy with lymph node dissection under the care of Dr. Servando Snare on 01/01/2019.  This was biopsy-proven to be disease recurrent adenocarcinoma lung.  He then underwent a course of concurrent chemoradiation with carboplatin for an AUC 2 and paclitaxel 45 mg per metered squared.  He is status post 7 cycles.  His last dose was given on 03/14/2022.  The patient had a partial response to treatment.   He is currently undergoing consolidation immunotherapy with Imfinzi for 200 mg IV every 4 weeks.  He status post 10 cycles and is tolerated well without any concerning adverse side effects.   Labs were reviewed.  Recommend that he proceed with  cycle number 8 today as scheduled.  The patient has CKD and his creatinine is *** today. We will instruct him to increase his hydration. Okay to proceed with treatment with his creatinine today.   We will see him back for follow-up visit in 4 weeks for evaluation and repeat blood work before undergoing cycle #11  He will continue taking his synthroid for his hypothyroidism   The patient was advised to call immediately if he has any concerning symptoms in the interval. The patient voices understanding of current disease status and treatment options and is in agreement with the current care plan. All questions were answered. The patient knows to call the clinic with any problems, questions or concerns. We can certainly see the patient much sooner if necessary           No orders of the defined types were placed in this encounter.    I spent {CHL ONC TIME VISIT - IWOEH:[id number]} counseling the patient face to face. The total time spent in the appointment was {CHL ONC TIME VISIT - BBCWU:[id number]}.  Mella Inclan L Mariselda Badalamenti, PA-C 01/14/23

## 2023-01-17 ENCOUNTER — Other Ambulatory Visit: Payer: Self-pay

## 2023-01-18 ENCOUNTER — Inpatient Hospital Stay: Payer: PPO | Attending: Internal Medicine | Admitting: Physician Assistant

## 2023-01-18 ENCOUNTER — Inpatient Hospital Stay: Payer: PPO

## 2023-01-18 ENCOUNTER — Other Ambulatory Visit: Payer: Self-pay

## 2023-01-18 VITALS — BP 117/56 | HR 50 | Temp 97.6°F | Resp 16 | Wt 175.3 lb

## 2023-01-18 DIAGNOSIS — Z79890 Hormone replacement therapy (postmenopausal): Secondary | ICD-10-CM | POA: Insufficient documentation

## 2023-01-18 DIAGNOSIS — R0609 Other forms of dyspnea: Secondary | ICD-10-CM | POA: Diagnosis not present

## 2023-01-18 DIAGNOSIS — C3492 Malignant neoplasm of unspecified part of left bronchus or lung: Secondary | ICD-10-CM

## 2023-01-18 DIAGNOSIS — Z79620 Long term (current) use of immunosuppressive biologic: Secondary | ICD-10-CM | POA: Diagnosis not present

## 2023-01-18 DIAGNOSIS — Z79899 Other long term (current) drug therapy: Secondary | ICD-10-CM | POA: Diagnosis not present

## 2023-01-18 DIAGNOSIS — J449 Chronic obstructive pulmonary disease, unspecified: Secondary | ICD-10-CM | POA: Insufficient documentation

## 2023-01-18 DIAGNOSIS — Z923 Personal history of irradiation: Secondary | ICD-10-CM | POA: Diagnosis not present

## 2023-01-18 DIAGNOSIS — E039 Hypothyroidism, unspecified: Secondary | ICD-10-CM

## 2023-01-18 DIAGNOSIS — Z5112 Encounter for antineoplastic immunotherapy: Secondary | ICD-10-CM

## 2023-01-18 DIAGNOSIS — C3412 Malignant neoplasm of upper lobe, left bronchus or lung: Secondary | ICD-10-CM | POA: Diagnosis present

## 2023-01-18 LAB — CMP (CANCER CENTER ONLY)
ALT: 14 U/L (ref 0–44)
AST: 14 U/L — ABNORMAL LOW (ref 15–41)
Albumin: 4.1 g/dL (ref 3.5–5.0)
Alkaline Phosphatase: 57 U/L (ref 38–126)
Anion gap: 5 (ref 5–15)
BUN: 16 mg/dL (ref 8–23)
CO2: 27 mmol/L (ref 22–32)
Calcium: 9.3 mg/dL (ref 8.9–10.3)
Chloride: 106 mmol/L (ref 98–111)
Creatinine: 1.78 mg/dL — ABNORMAL HIGH (ref 0.61–1.24)
GFR, Estimated: 39 mL/min — ABNORMAL LOW (ref >60)
Glucose, Bld: 117 mg/dL — ABNORMAL HIGH (ref 70–99)
Potassium: 4.2 mmol/L (ref 3.5–5.1)
Sodium: 138 mmol/L (ref 135–145)
Total Bilirubin: 0.6 mg/dL (ref 0.3–1.2)
Total Protein: 6.9 g/dL (ref 6.5–8.1)

## 2023-01-18 LAB — CBC WITH DIFFERENTIAL (CANCER CENTER ONLY)
Abs Immature Granulocytes: 0.02 10*3/uL (ref 0.00–0.07)
Basophils Absolute: 0 10*3/uL (ref 0.0–0.1)
Basophils Relative: 1 %
Eosinophils Absolute: 0.1 10*3/uL (ref 0.0–0.5)
Eosinophils Relative: 2 %
HCT: 39 % (ref 39.0–52.0)
Hemoglobin: 14.2 g/dL (ref 13.0–17.0)
Immature Granulocytes: 0 %
Lymphocytes Relative: 26 %
Lymphs Abs: 1.3 10*3/uL (ref 0.7–4.0)
MCH: 33.6 pg (ref 26.0–34.0)
MCHC: 36.4 g/dL — ABNORMAL HIGH (ref 30.0–36.0)
MCV: 92.2 fL (ref 80.0–100.0)
Monocytes Absolute: 0.4 10*3/uL (ref 0.1–1.0)
Monocytes Relative: 9 %
Neutro Abs: 3.1 10*3/uL (ref 1.7–7.7)
Neutrophils Relative %: 62 %
Platelet Count: 138 10*3/uL — ABNORMAL LOW (ref 150–400)
RBC: 4.23 MIL/uL (ref 4.22–5.81)
RDW: 12.4 % (ref 11.5–15.5)
WBC Count: 4.9 10*3/uL (ref 4.0–10.5)
nRBC: 0 % (ref 0.0–0.2)

## 2023-01-18 LAB — TSH: TSH: 16.074 u[IU]/mL — ABNORMAL HIGH (ref 0.350–4.500)

## 2023-01-18 MED ORDER — SODIUM CHLORIDE 0.9 % IV SOLN: Freq: Once | INTRAVENOUS | Status: AC

## 2023-01-18 MED ORDER — LEVOTHYROXINE SODIUM 100 MCG PO TABS: 100.0000 ug | ORAL_TABLET | Freq: Every day | ORAL | 2 refills | Status: DC

## 2023-01-18 MED ORDER — SODIUM CHLORIDE 0.9 % IV SOLN
1500.0000 mg | Freq: Once | INTRAVENOUS | Status: AC
  Administered 2023-01-18: 1500 mg via INTRAVENOUS
  Filled 2023-01-18: qty 30

## 2023-01-18 NOTE — Progress Notes (Signed)
Patient seen by  PA Today  Vitals are within treatment parameters.  Labs reviewed: and are within treatment parameters."  Per physician team, patient is ready for treatment and there are NO modifications to the treatment plan.

## 2023-01-24 ENCOUNTER — Telehealth: Payer: Self-pay | Admitting: Internal Medicine

## 2023-01-24 NOTE — Telephone Encounter (Signed)
Called patient regarding March/April appointments, left a voicemail.

## 2023-01-25 ENCOUNTER — Other Ambulatory Visit: Payer: Self-pay

## 2023-01-26 ENCOUNTER — Other Ambulatory Visit: Payer: Self-pay

## 2023-02-15 ENCOUNTER — Encounter: Payer: Self-pay | Admitting: Internal Medicine

## 2023-02-15 ENCOUNTER — Inpatient Hospital Stay: Payer: PPO

## 2023-02-15 ENCOUNTER — Inpatient Hospital Stay: Payer: PPO | Attending: Internal Medicine

## 2023-02-15 ENCOUNTER — Other Ambulatory Visit: Payer: Self-pay

## 2023-02-15 ENCOUNTER — Inpatient Hospital Stay (HOSPITAL_BASED_OUTPATIENT_CLINIC_OR_DEPARTMENT_OTHER): Payer: PPO | Admitting: Internal Medicine

## 2023-02-15 VITALS — BP 119/53 | HR 45 | Temp 97.6°F | Resp 14

## 2023-02-15 DIAGNOSIS — J449 Chronic obstructive pulmonary disease, unspecified: Secondary | ICD-10-CM | POA: Insufficient documentation

## 2023-02-15 DIAGNOSIS — Z79890 Hormone replacement therapy (postmenopausal): Secondary | ICD-10-CM | POA: Insufficient documentation

## 2023-02-15 DIAGNOSIS — C3492 Malignant neoplasm of unspecified part of left bronchus or lung: Secondary | ICD-10-CM

## 2023-02-15 DIAGNOSIS — Z5112 Encounter for antineoplastic immunotherapy: Secondary | ICD-10-CM | POA: Insufficient documentation

## 2023-02-15 DIAGNOSIS — Z923 Personal history of irradiation: Secondary | ICD-10-CM | POA: Insufficient documentation

## 2023-02-15 DIAGNOSIS — C3412 Malignant neoplasm of upper lobe, left bronchus or lung: Secondary | ICD-10-CM | POA: Insufficient documentation

## 2023-02-15 DIAGNOSIS — Z79899 Other long term (current) drug therapy: Secondary | ICD-10-CM | POA: Diagnosis not present

## 2023-02-15 DIAGNOSIS — Z79620 Long term (current) use of immunosuppressive biologic: Secondary | ICD-10-CM | POA: Diagnosis not present

## 2023-02-15 LAB — CMP (CANCER CENTER ONLY)
ALT: 11 U/L (ref 0–44)
AST: 12 U/L — ABNORMAL LOW (ref 15–41)
Albumin: 4 g/dL (ref 3.5–5.0)
Alkaline Phosphatase: 57 U/L (ref 38–126)
Anion gap: 6 (ref 5–15)
BUN: 12 mg/dL (ref 8–23)
CO2: 25 mmol/L (ref 22–32)
Calcium: 8.8 mg/dL — ABNORMAL LOW (ref 8.9–10.3)
Chloride: 107 mmol/L (ref 98–111)
Creatinine: 1.73 mg/dL — ABNORMAL HIGH (ref 0.61–1.24)
GFR, Estimated: 40 mL/min — ABNORMAL LOW (ref >60)
Glucose, Bld: 191 mg/dL — ABNORMAL HIGH (ref 70–99)
Potassium: 3.8 mmol/L (ref 3.5–5.1)
Sodium: 138 mmol/L (ref 135–145)
Total Bilirubin: 0.6 mg/dL (ref 0.3–1.2)
Total Protein: 6.8 g/dL (ref 6.5–8.1)

## 2023-02-15 LAB — CBC WITH DIFFERENTIAL (CANCER CENTER ONLY)
Abs Immature Granulocytes: 0.01 10*3/uL (ref 0.00–0.07)
Basophils Absolute: 0 10*3/uL (ref 0.0–0.1)
Basophils Relative: 1 %
Eosinophils Absolute: 0.1 10*3/uL (ref 0.0–0.5)
Eosinophils Relative: 2 %
HCT: 39.1 % (ref 39.0–52.0)
Hemoglobin: 13.8 g/dL (ref 13.0–17.0)
Immature Granulocytes: 0 %
Lymphocytes Relative: 26 %
Lymphs Abs: 0.8 10*3/uL (ref 0.7–4.0)
MCH: 33.2 pg (ref 26.0–34.0)
MCHC: 35.3 g/dL (ref 30.0–36.0)
MCV: 94 fL (ref 80.0–100.0)
Monocytes Absolute: 0.3 10*3/uL (ref 0.1–1.0)
Monocytes Relative: 9 %
Neutro Abs: 2 10*3/uL (ref 1.7–7.7)
Neutrophils Relative %: 62 %
Platelet Count: 142 10*3/uL — ABNORMAL LOW (ref 150–400)
RBC: 4.16 MIL/uL — ABNORMAL LOW (ref 4.22–5.81)
RDW: 12.4 % (ref 11.5–15.5)
WBC Count: 3.2 10*3/uL — ABNORMAL LOW (ref 4.0–10.5)
nRBC: 0 % (ref 0.0–0.2)

## 2023-02-15 LAB — TSH: TSH: 7.839 u[IU]/mL — ABNORMAL HIGH (ref 0.350–4.500)

## 2023-02-15 MED ORDER — SODIUM CHLORIDE 0.9 % IV SOLN
1500.0000 mg | Freq: Once | INTRAVENOUS | Status: AC
  Administered 2023-02-15: 1500 mg via INTRAVENOUS
  Filled 2023-02-15: qty 30

## 2023-02-15 MED ORDER — SODIUM CHLORIDE 0.9 % IV SOLN: Freq: Once | INTRAVENOUS | Status: AC

## 2023-02-15 NOTE — Progress Notes (Signed)
[place] [phone]   [phone]  OFFICE PROGRESS NOTE  [name], MD [address] [place] [address]  DIAGNOSIS: Recurrent non-small cell lung cancer initially diagnosed as stage IA (T1b, N0, M0) non-small cell lung cancer, adenocarcinoma presented with left upper lobe lung nodule diagnosed in January 2020.  The patient has evidence for disease recurrence in AP window lymphadenopathy in December 2022.   There was insufficient material for the molecular studies by [facility].   PRIOR THERAPY:  1) Status post left lingulectomy with lymph node dissection under the care of Dr. Servando Snare on January 01, 2019. 2) Concurrent chemoradiation with carboplatin for an AUC of 2 and paclitaxel 45 mg per metered squared weekly.  First dose on 01/24/2022.  Status post 7 cycles. Last dose was given March 14, 2022   CURRENT THERAPY: Consolidation treatment with immunotherapy with Imfinzi 1500 Mg IV every 4 weeks.  First dose Apr 12, 2022.  Status post 11 cycles.  INTERVAL HISTORY: BERNON SURTI 77 y.o. male returns to the clinic today for follow-up visit accompanied by his wife.  The patient is feeling fine today with no concerning complaints.  He denied having any chest pain, shortness of breath, cough or hemoptysis.  He has no nausea, vomiting, diarrhea or constipation.  He has no headache or visual changes.  He denied having any recent weight loss or night sweats.  He is here today for evaluation before starting cycle #12 of his treatment.   MEDICAL HISTORY: Past Medical History:  Diagnosis Date   Adenomatous colon polyp    Anxiety    Blood transfusion without reported diagnosis    Cancer ([place])    Lung; 11/2018   CKD (chronic kidney disease)    stage III 11/09/18   COPD (chronic obstructive pulmonary disease) (HCC)    Gastric ulcer    GERD (gastroesophageal reflux disease)    High cholesterol    History of radiation therapy     Left Lung- 02/17/22-03/18/22-Dr. Gery Pray   Hypertension    Pre-diabetes    A1c 6.2% 11/09/18    ALLERGIES:  has No Known Allergies.  MEDICATIONS:  Current Outpatient Medications  Medication Sig Dispense Refill   albuterol (PROVENTIL HFA;VENTOLIN HFA) 108 (90 Base) MCG/ACT inhaler Inhale 2 puffs into the lungs every 6 (six) hours as needed for wheezing or shortness of breath. Reported on 06/02/2016     amLODipine (NORVASC) 10 MG tablet Take 10 mg by mouth daily.      ANORO ELLIPTA 62.5-25 MCG/ACT AEPB Inhale 1 puff into the lungs daily.     diazepam (VALIUM) 2 MG tablet Take by mouth. (Patient not taking: Reported on 08/31/2022)     esomeprazole (NEXIUM) 40 MG capsule Take 40 mg by mouth daily.      folic acid (FOLVITE) 1 MG tablet Take by mouth. (Patient not taking: Reported on 08/31/2022)     levothyroxine (SYNTHROID) 100 MCG tablet Take 1 tablet (100 mcg total) by mouth daily before breakfast. 30 tablet 2   losartan (COZAAR) 50 MG tablet Take 50 mg by mouth daily.      OVER THE COUNTER MEDICATION Take 1 tablet by mouth daily as needed (for constipation).     prochlorperazine (COMPAZINE) 10 MG tablet TAKE 1 TABLET BY MOUTH EVERY 6 HOURS AS NEEDED 30 tablet 0   rosuvastatin (CRESTOR) 10 MG tablet Take 10 mg by mouth daily.     sucralfate (CARAFATE) 1  g tablet Take 1 tablet (1 g total) by mouth 4 (four) times daily -  with meals and at bedtime. Crush and dissolve in 10 mL of warm water prior to swallowing 120 tablet 1   traZODone (DESYREL) 50 MG tablet Take 50 mg by mouth at bedtime.     No current facility-administered medications for this visit.    SURGICAL HISTORY:  Past Surgical History:  Procedure Laterality Date   BRONCHIAL NEEDLE ASPIRATION BIOPSY  01/10/2022   Procedure: BRONCHIAL NEEDLE ASPIRATION BIOPSIES;  Surgeon: Collene Gobble, MD;  Location: [facility] ENDOSCOPY;  Service: Cardiopulmonary;;   COLONOSCOPY  2008   ESOPHAGOGASTRODUODENOSCOPY  11/23/2011   Procedure:  ESOPHAGOGASTRODUODENOSCOPY (EGD);  Surgeon: Owens Loffler, MD;  Location: [facility] ENDOSCOPY;  Service: Endoscopy;  Laterality: N/A;   INTERCOSTAL NERVE BLOCK Left 01/01/2019   Procedure: INTERCOSTAL NERVE BLOCK;  Surgeon: Grace Isaac, MD;  Location: [place];  Service: Thoracic;  Laterality: Left;   LUNG BIOPSY  12/18/2018   Procedure: LUNG BIOPSY - of Left Upper Lobe Biopsy of #7 Node Biopsy of #10L Node;  Surgeon: Grace Isaac, MD;  Location: [place];  Service: Thoracic;;   LYMPH NODE DISSECTION Left 01/01/2019   Procedure: LYMPH NODE DISSECTION;  Surgeon: Grace Isaac, MD;  Location: [place];  Service: Thoracic;  Laterality: Left;   UPPER GASTROINTESTINAL ENDOSCOPY  11/23/11   VIDEO ASSISTED THORACOSCOPY (VATS)/WEDGE RESECTION Left 01/01/2019   Procedure: VIDEO ASSISTED THORACOSCOPY (VATS)/ LEFT LINGULECTOMY;  Surgeon: Grace Isaac, MD;  Location: [place];  Service: Thoracic;  Laterality: Left;   VIDEO BRONCHOSCOPY WITH ENDOBRONCHIAL NAVIGATION N/A 12/18/2018   Procedure: VIDEO BRONCHOSCOPY WITH ENDOBRONCHIAL NAVIGATION with Placement of Fiducial Markers x 3;  Surgeon: Grace Isaac, MD;  Location: [place];  Service: Thoracic;  Laterality: N/A;   VIDEO BRONCHOSCOPY WITH ENDOBRONCHIAL ULTRASOUND N/A 12/18/2018   Procedure: VIDEO BRONCHOSCOPY WITH ENDOBRONCHIAL ULTRASOUND;  Surgeon: Grace Isaac, MD;  Location: [place];  Service: Thoracic;  Laterality: N/A;   VIDEO BRONCHOSCOPY WITH ENDOBRONCHIAL ULTRASOUND Left 01/10/2022   Procedure: VIDEO BRONCHOSCOPY WITH ENDOBRONCHIAL ULTRASOUND;  Surgeon: Collene Gobble, MD;  Location: [place];  Service: Cardiopulmonary;  Laterality: Left;    REVIEW OF SYSTEMS:  A comprehensive review of systems was negative.   PHYSICAL EXAMINATION: General appearance: alert, cooperative, and no distress Head: Normocephalic, without obvious abnormality, atraumatic Neck: no adenopathy, no JVD, supple, symmetrical, trachea midline, and thyroid not enlarged,  symmetric, no tenderness/mass/nodules Lymph nodes: Cervical, supraclavicular, and axillary nodes normal. Resp: clear to auscultation bilaterally Back: symmetric, no curvature. ROM normal. No CVA tenderness. Cardio: regular rate and rhythm, S1, S2 normal, no murmur, click, rub or gallop GI: soft, non-tender; bowel sounds normal; no masses,  no organomegaly Extremities: extremities normal, atraumatic, no cyanosis or edema  ECOG PERFORMANCE STATUS: 1 - Symptomatic but completely ambulatory  Blood pressure 120/62, pulse (!) 46, temperature 97.6 F (36.4 C), temperature source Oral, resp. rate 17, weight 174 lb 9 oz (79.2 kg), SpO2 96 %.  LABORATORY DATA: Lab Results  Component Value Date   WBC 3.2 (L) 02/15/2023   HGB 13.8 02/15/2023   HCT 39.1 02/15/2023   MCV 94.0 02/15/2023   PLT 142 (L) 02/15/2023      Chemistry      Component Value Date/Time   NA 138 01/18/2023 0910   K 4.2 01/18/2023 0910   CL 106 01/18/2023 0910   CO2 27 01/18/2023 0910   BUN 16 01/18/2023 0910   CREATININE 1.78 (  H) 01/18/2023 0910      Component Value Date/Time   CALCIUM 9.3 01/18/2023 0910   ALKPHOS 57 01/18/2023 0910   AST 14 (L) 01/18/2023 0910   ALT 14 01/18/2023 0910   BILITOT 0.6 01/18/2023 0910       RADIOGRAPHIC STUDIES: No results found.  ASSESSMENT AND PLAN: This is a very pleasant 77 years old white male diagnosed with recurrent non-small cell lung cancer that was initially diagnosed as stage IA (T1b, N0, M0) non-small cell lung cancer, adenocarcinoma presented with left upper lobe lung nodule in January 2020.  The patient has evidence for disease recurrence in AP window lymphadenopathy in December 2022.  He is status post left lingulectomy with lymph node dissection under the care of Dr. Servando Snare on January 01, 2019.  This was biopsy-proven to be recurrent adenocarcinoma of the lung. The patient underwent a course of concurrent chemoradiation with weekly carboplatin for AUC of 2 and  paclitaxel 45 Mg/M2 status post 7 cycles.  Last dose was given March 14, 2022.  The patient has partial response to this treatment. The patient is currently undergoing consolidation treatment with immunotherapy with Imfinzi 1500 Mg IV every 4 weeks status post 11 cycles.   The patient continues to tolerate this treatment well with no concerning adverse effects. I recommended for him to proceed with cycle #12 today as planned. For the renal insufficiency, he is followed by his nephrologist. The patient was advised to call immediately if he has any other concerning symptoms in the interval. The patient voices understanding of current disease status and treatment options and is in agreement with the current care plan.  All questions were answered. The patient knows to call the clinic with any problems, questions or concerns. We can certainly see the patient much sooner if necessary.  Disclaimer: This note was dictated with voice recognition software. Similar sounding words can inadvertently be transcribed and may not be corrected upon review.

## 2023-02-15 NOTE — Progress Notes (Signed)
Patient seen by MD today  Vitals are within treatment parameters.  Labs reviewed: and are not all within treatment parameters. Per Dr. Willette Alma d,it is okay to treat pt today with Durvalumab and creatinine of  1.73.  Per physician team, patient is ready for treatment and there are NO modifications to the treatment plan.

## 2023-02-15 NOTE — Patient Instructions (Signed)
[facility]  Discharge Instructions: Thank you for choosing [facility] to provide your oncology and hematology care.   If you have a lab appointment with the Cancer Center, please go directly to the Cancer Center and check in at the registration area.   Wear comfortable clothing and clothing appropriate for easy access to any Portacath or PICC line.   We strive to give you quality time with your provider. You may need to reschedule your appointment if you arrive late (15 or more minutes).  Arriving late affects you and other patients whose appointments are after yours.  Also, if you miss three or more appointments without notifying the office, you may be dismissed from the clinic at the provider's discretion.      For prescription refill requests, have your pharmacy contact our office and allow 72 hours for refills to be completed.    Today you received the following chemotherapy and/or immunotherapy agents: Imfinzi      To help prevent nausea and vomiting after your treatment, we encourage you to take your nausea medication as directed.  BELOW ARE SYMPTOMS THAT SHOULD BE REPORTED IMMEDIATELY: *FEVER GREATER THAN 100.4 F (38 C) OR HIGHER *CHILLS OR SWEATING *NAUSEA AND VOMITING THAT IS NOT CONTROLLED WITH YOUR NAUSEA MEDICATION *UNUSUAL SHORTNESS OF BREATH *UNUSUAL BRUISING OR BLEEDING *URINARY PROBLEMS (pain or burning when urinating, or frequent urination) *BOWEL PROBLEMS (unusual diarrhea, constipation, pain near the anus) TENDERNESS IN MOUTH AND THROAT WITH OR WITHOUT PRESENCE OF ULCERS (sore throat, sores in mouth, or a toothache) UNUSUAL RASH, SWELLING OR PAIN  UNUSUAL VAGINAL DISCHARGE OR ITCHING   Items with * indicate a potential emergency and should be followed up as soon as possible or go to the Emergency Department if any problems should occur.  Please show the CHEMOTHERAPY ALERT CARD or IMMUNOTHERAPY ALERT CARD at  check-in to the Emergency Department and triage nurse.  Should you have questions after your visit or need to cancel or reschedule your appointment, please contact  [facility]  Dept: [phone]  and follow the prompts.  Office hours are 8:00 a.m. to 4:30 p.m. Monday - Friday. Please note that voicemails left after 4:00 p.m. may not be returned until the following business day.  We are closed weekends and major holidays. You have access to a nurse at all times for urgent questions. Please call the main number to the clinic Dept: [phone] and follow the prompts.   For any non-urgent questions, you may also contact your provider using MyChart. We now offer e-Visits for anyone 18 and older to request care online for non-urgent symptoms. For details visit [email or web address].   Also download the MyChart app! Go to the app store, search "MyChart", open the app, select , and log in with your MyChart username and password.   

## 2023-02-17 LAB — T4: T4, Total: 10.1 ug/dL (ref 4.5–12.0)

## 2023-02-21 ENCOUNTER — Telehealth: Payer: Self-pay | Admitting: Internal Medicine

## 2023-02-21 NOTE — Telephone Encounter (Signed)
Called patient regarding upcoming April appointments, patient is notified. 

## 2023-03-03 ENCOUNTER — Telehealth: Payer: Self-pay | Admitting: Internal Medicine

## 2023-03-03 NOTE — Telephone Encounter (Signed)
Rescheduled 04/10 appointments per provider pal, left a voicemail.

## 2023-03-07 ENCOUNTER — Other Ambulatory Visit: Payer: Self-pay

## 2023-03-09 NOTE — Progress Notes (Signed)
[facility] OFFICE PROGRESS NOTE  Gwenlyn Found, MD [address] [place] [address]  DIAGNOSIS: Recurrent non-small cell lung cancer initially diagnosed as stage IA (T1b, N0, M0) non-small cell lung cancer, adenocarcinoma presented with left upper lobe lung nodule diagnosed in January 2020.  The patient has evidence for disease recurrence in AP window lymphadenopathy in December 2022.   There was insufficient material for the molecular studies by [facility].  PRIOR THERAPY: 1) Status post left lingulectomy with lymph node dissection under the care of Dr. Tyrone Sage on January 01, 2019 2) Concurrent chemoradiation with carboplatin for an AUC of 2 and paclitaxel 45 mg per metered squared weekly.  First dose on 01/24/2022.  Status post 7 cycles. Last dose was given March 14, 2022   CURRENT THERAPY: Consolidation treatment with immunotherapy with Imfinzi 1500 Mg IV every 4 weeks.  First dose Apr 12, 2022.  Status post 12 cycles.    INTERVAL HISTORY: Jack Taylor 77 y.o. male returns to the clinic today for a follow-up visit accompanied by his wife.  The patient is feeling well currently well today without any concerning complaints. The patient is currently undergoing immunotherapy with Imfinzi and is tolerating well without any concerning adverse side effects except for thyroid dysfunction. He denies any recent fever, chills, night sweats, or weight loss.  Denies any chest pain, cough, or hemoptysis.  He reports he has baseline dyspnea on exertion. He denies any nausea, vomiting, or diarrhea. Denies any recent constipation. He denies any headache or visual changes.  Denies any rashes or skin changes except for itching without associated rash. He is here today for evaluation before starting cycle #13.    MEDICAL HISTORY: Past Medical History:  Diagnosis Date   Adenomatous colon polyp    Anxiety    Blood transfusion without reported diagnosis    Cancer (HCC)     Lung; 11/2018   CKD (chronic kidney disease)    stage III 11/09/18   COPD (chronic obstructive pulmonary disease) (HCC)    Gastric ulcer    GERD (gastroesophageal reflux disease)    High cholesterol    History of radiation therapy    Left Lung- 02/17/22-03/18/22-Dr. Antony Blackbird   Hypertension    Pre-diabetes    A1c 6.2% 11/09/18    ALLERGIES:  has No Known Allergies.  MEDICATIONS:  Current Outpatient Medications  Medication Sig Dispense Refill   albuterol (PROVENTIL HFA;VENTOLIN HFA) 108 (90 Base) MCG/ACT inhaler Inhale 2 puffs into the lungs every 6 (six) hours as needed for wheezing or shortness of breath. Reported on 06/02/2016     amLODipine (NORVASC) 10 MG tablet Take 10 mg by mouth daily.      ANORO ELLIPTA 62.5-25 MCG/ACT AEPB Inhale 1 puff into the lungs daily.     diazepam (VALIUM) 2 MG tablet Take by mouth. (Patient not taking: Reported on 08/31/2022)     esomeprazole (NEXIUM) 40 MG capsule Take 40 mg by mouth daily.      levothyroxine (SYNTHROID) 100 MCG tablet Take 1 tablet (100 mcg total) by mouth daily before breakfast. 30 tablet 2   losartan (COZAAR) 50 MG tablet Take 50 mg by mouth daily.      OVER THE COUNTER MEDICATION Take 1 tablet by mouth daily as needed (for constipation).     prochlorperazine (COMPAZINE) 10 MG tablet TAKE 1 TABLET BY MOUTH EVERY 6 HOURS AS NEEDED 30 tablet 0   rosuvastatin (CRESTOR) 10 MG tablet Take 10 mg  by mouth daily.     sucralfate (CARAFATE) 1 g tablet Take 1 tablet (1 g total) by mouth 4 (four) times daily -  with meals and at bedtime. Crush and dissolve in 10 mL of warm water prior to swallowing 120 tablet 1   traZODone (DESYREL) 50 MG tablet Take 50 mg by mouth at bedtime.     No current facility-administered medications for this visit.    SURGICAL HISTORY:  Past Surgical History:  Procedure Laterality Date   BRONCHIAL NEEDLE ASPIRATION BIOPSY  01/10/2022   Procedure: BRONCHIAL NEEDLE ASPIRATION BIOPSIES;  Surgeon: Leslye PeerByrum, Robert S,  MD;  [facility];  Service: Cardiopulmonary;;   COLONOSCOPY  2008   ESOPHAGOGASTRODUODENOSCOPY  11/23/2011   Procedure: ESOPHAGOGASTRODUODENOSCOPY (EGD);  Surgeon: Rob Buntinganiel Jacobs, MD;  [facility];  Service: Endoscopy;  Laterality: N/A;   INTERCOSTAL NERVE BLOCK Left 01/01/2019   Procedure: INTERCOSTAL NERVE BLOCK;  Surgeon: Delight OvensGerhardt, Edward B, MD;  [facility];  Service: Thoracic;  Laterality: Left;   LUNG BIOPSY  12/18/2018   Procedure: LUNG BIOPSY - of Left Upper Lobe Biopsy of #7 Node Biopsy of #10L Node;  Surgeon: Delight OvensGerhardt, Edward B, MD;  [facility];  Service: Thoracic;;   LYMPH NODE DISSECTION Left 01/01/2019   Procedure: LYMPH NODE DISSECTION;  Surgeon: Delight OvensGerhardt, Edward B, MD;  [facility];  Service: Thoracic;  Laterality: Left;   UPPER GASTROINTESTINAL ENDOSCOPY  11/23/11   VIDEO ASSISTED THORACOSCOPY (VATS)/WEDGE RESECTION Left 01/01/2019   Procedure: VIDEO ASSISTED THORACOSCOPY (VATS)/ LEFT LINGULECTOMY;  Surgeon: Delight OvensGerhardt, Edward B, MD;  Location: [facility] OR;  Service: Thoracic;  Laterality: Left;   VIDEO BRONCHOSCOPY WITH ENDOBRONCHIAL NAVIGATION N/A 12/18/2018   Procedure: VIDEO BRONCHOSCOPY WITH ENDOBRONCHIAL NAVIGATION with Placement of Fiducial Markers x 3;  Surgeon: Delight OvensGerhardt, Edward B, MD;  [facility];  Service: Thoracic;  Laterality: N/A;   VIDEO BRONCHOSCOPY WITH ENDOBRONCHIAL ULTRASOUND N/A 12/18/2018   Procedure: VIDEO BRONCHOSCOPY WITH ENDOBRONCHIAL ULTRASOUND;  Surgeon: Delight OvensGerhardt, Edward B, MD;  [facility];  Service: Thoracic;  Laterality: N/A;   VIDEO BRONCHOSCOPY WITH ENDOBRONCHIAL ULTRASOUND Left 01/10/2022   Procedure: VIDEO BRONCHOSCOPY WITH ENDOBRONCHIAL ULTRASOUND;  Surgeon: Leslye PeerByrum, Robert S, MD;  Location: [facility] ENDOSCOPY;  Service: Cardiopulmonary;  Laterality: Left;    REVIEW OF SYSTEMS:   Constitutional: Negative for appetite change, chills, fatigue, fever and unexpected weight change.  HENT: Negative for mouth sores, nosebleeds, sore  throat and trouble swallowing.  Eyes: Negative for eye problems and icterus.  Respiratory: Positive for baseline dyspnea on exertion. Negative for cough, hemoptysis, and wheezing.   Cardiovascular: Negative for chest pain and leg swelling.  Gastrointestinal: Negative for abdominal pain, constipation, diarrhea, nausea and vomiting.  Genitourinary: Negative for bladder incontinence, difficulty urinating, dysuria, frequency and hematuria.   Musculoskeletal: Negative for back pain, gait problem, neck pain and neck stiffness.  Skin: Positive for itching. Negative for rash. Positive for dry skin Neurological: Negative for dizziness, extremity weakness, gait problem, headaches, light-headedness and seizures.  Hematological: Negative for adenopathy. Does not bruise/bleed easily.  Psychiatric/Behavioral: Negative for confusion, depression and sleep disturbance. The patient is not nervous/anxious.   PHYSICAL EXAMINATION:  Blood pressure 125/64, pulse (!) 52, temperature 97.9 F (36.6 C), resp. rate 18, weight 174 lb 3.2 oz (79 kg), SpO2 96 %.  ECOG PERFORMANCE STATUS: 1  Physical Exam  Constitutional: Oriented to person, place, and time and well-developed, well-nourished, and in no distress.  HENT:  Head: Normocephalic and atraumatic.  Hard of hearing Mouth/Throat: Oropharynx is  clear and moist. No oropharyngeal exudate.  Eyes: Conjunctivae are normal. Right eye exhibits no discharge. Left eye exhibits no discharge. No scleral icterus.  Neck: Normal range of motion. Neck supple.  Cardiovascular: Bradycardic, regular rhythm, normal heart sounds and intact distal pulses.   Pulmonary/Chest: Effort normal and breath sounds normal. No respiratory distress. No wheezes. No rales.  Abdominal: Soft. Bowel sounds are normal. Exhibits no distension and no mass. There is no tenderness.  Musculoskeletal: Normal range of motion. Exhibits no edema.  Lymphadenopathy:    No cervical adenopathy.  Neurological:  Alert and oriented to person, place, and time. Exhibits normal muscle tone. Gait normal. Coordination normal.  Skin: Skin is warm and dry. No rash noted. Not diaphoretic. No erythema. No pallor.  Psychiatric: Mood, memory and judgment normal.  Vitals reviewed.  LABORATORY DATA: Lab Results  Component Value Date   WBC 3.8 (L) 03/15/2023   HGB 14.1 03/15/2023   HCT 39.9 03/15/2023   MCV 93.9 03/15/2023   PLT 141 (L) 03/15/2023      Chemistry      Component Value Date/Time   NA 139 03/15/2023 0842   K 4.2 03/15/2023 0842   CL 108 03/15/2023 0842   CO2 26 03/15/2023 0842   BUN 13 03/15/2023 0842   CREATININE 1.84 (H) 03/15/2023 0842      Component Value Date/Time   CALCIUM 9.3 03/15/2023 0842   ALKPHOS 59 03/15/2023 0842   AST 17 03/15/2023 0842   ALT 17 03/15/2023 0842   BILITOT 0.5 03/15/2023 0842       RADIOGRAPHIC STUDIES:  No results found.   ASSESSMENT/PLAN:  This is a very pleasant 77 year old Caucasian male diagnosed with recurrent non-small cell lung cancer that was initially diagnosed as a stage Ia (T1b, N0, M0) non-small cell lung cancer, adenocarcinoma.  He initially presented with a left upper lobe lung nodule in January 2020.  He had evidence of disease recurrence with an AP window lymphadenopathy in December 2022.  He is status post left lingulectomy with lymph node dissection under the care of Dr. Tyrone Sage on 01/01/2019.  This was biopsy-proven to be disease recurrent adenocarcinoma lung.  He then underwent a course of concurrent chemoradiation with carboplatin for an AUC 2 and paclitaxel 45 mg per metered squared.  He is status post 7 cycles.  His last dose was given on 03/14/2022.  The patient had a partial response to treatment.    He is currently undergoing consolidation immunotherapy with Imfinzi for 200 mg IV every 4 weeks.  He status post 12 cycles and is tolerated well without any concerning adverse side effects.   Labs were reviewed.  Recommend that  he proceed with cycle number 13 today as scheduled.  This is his last cycle of treatment as he has now completed 1 year of treatment.  The patient has CKD and his creatinine is 1.84 today.  It is ok to proceed with treatment with his creatinine today.    We will see him back for follow-up visit in 4 weeks for evaluation to review the restaging CT scan results.  I will place an order for CT of the chest without contrast due to his CKD.  He will continue taking his synthroid for his hypothyroidism. I have refilled this medication and increased his dose to 100 mcg based on his labs from last month.   The itching without rash could be secondary to immunotherapy. He was advised to use a antihistamine, such as claritin or zyrtec due  to his age as benadryl would cause significant drowsiness. For localized area of itching, he may use hydrocortisone cream.   The patient was advised to call immediately if he has any concerning symptoms in the interval. The patient voices understanding of current disease status and treatment options and is in agreement with the current care plan. All questions were answered. The patient knows to call the clinic with any problems, questions or concerns. We can certainly see the patient much sooner if necessary      Orders Placed This Encounter  Procedures   CT Chest Wo Contrast    Standing Status:   Future    Standing Expiration Date:   03/14/2024    Order Specific Question:   Preferred imaging location?    Answer:   [facility]     The total time spent in the appointment was 20-29 minutes  Shahad Mazurek L Jashan Cotten, PA-C 03/15/23

## 2023-03-14 ENCOUNTER — Other Ambulatory Visit: Payer: PPO

## 2023-03-14 ENCOUNTER — Ambulatory Visit: Payer: PPO | Admitting: Internal Medicine

## 2023-03-14 ENCOUNTER — Ambulatory Visit: Payer: PPO

## 2023-03-15 ENCOUNTER — Inpatient Hospital Stay: Payer: PPO | Attending: Internal Medicine

## 2023-03-15 ENCOUNTER — Other Ambulatory Visit: Payer: Self-pay

## 2023-03-15 ENCOUNTER — Inpatient Hospital Stay (HOSPITAL_BASED_OUTPATIENT_CLINIC_OR_DEPARTMENT_OTHER): Payer: PPO | Admitting: Physician Assistant

## 2023-03-15 ENCOUNTER — Ambulatory Visit: Payer: PPO | Admitting: Internal Medicine

## 2023-03-15 ENCOUNTER — Inpatient Hospital Stay: Payer: PPO

## 2023-03-15 ENCOUNTER — Other Ambulatory Visit: Payer: PPO

## 2023-03-15 ENCOUNTER — Ambulatory Visit: Payer: PPO

## 2023-03-15 VITALS — BP 125/64 | HR 52 | Temp 97.9°F | Resp 18 | Wt 174.2 lb

## 2023-03-15 VITALS — BP 114/62 | HR 57 | Resp 19

## 2023-03-15 DIAGNOSIS — L299 Pruritus, unspecified: Secondary | ICD-10-CM | POA: Diagnosis not present

## 2023-03-15 DIAGNOSIS — Z923 Personal history of irradiation: Secondary | ICD-10-CM | POA: Insufficient documentation

## 2023-03-15 DIAGNOSIS — C3492 Malignant neoplasm of unspecified part of left bronchus or lung: Secondary | ICD-10-CM

## 2023-03-15 DIAGNOSIS — Z5112 Encounter for antineoplastic immunotherapy: Secondary | ICD-10-CM

## 2023-03-15 DIAGNOSIS — R0609 Other forms of dyspnea: Secondary | ICD-10-CM | POA: Insufficient documentation

## 2023-03-15 DIAGNOSIS — J449 Chronic obstructive pulmonary disease, unspecified: Secondary | ICD-10-CM | POA: Diagnosis not present

## 2023-03-15 DIAGNOSIS — C3412 Malignant neoplasm of upper lobe, left bronchus or lung: Secondary | ICD-10-CM | POA: Insufficient documentation

## 2023-03-15 DIAGNOSIS — Z79620 Long term (current) use of immunosuppressive biologic: Secondary | ICD-10-CM | POA: Insufficient documentation

## 2023-03-15 DIAGNOSIS — Z79899 Other long term (current) drug therapy: Secondary | ICD-10-CM | POA: Diagnosis not present

## 2023-03-15 LAB — CBC WITH DIFFERENTIAL (CANCER CENTER ONLY)
Abs Immature Granulocytes: 0.01 10*3/uL (ref 0.00–0.07)
Basophils Absolute: 0 10*3/uL (ref 0.0–0.1)
Basophils Relative: 1 %
Eosinophils Absolute: 0.1 10*3/uL (ref 0.0–0.5)
Eosinophils Relative: 3 %
HCT: 39.9 % (ref 39.0–52.0)
Hemoglobin: 14.1 g/dL (ref 13.0–17.0)
Immature Granulocytes: 0 %
Lymphocytes Relative: 29 %
Lymphs Abs: 1.1 10*3/uL (ref 0.7–4.0)
MCH: 33.2 pg (ref 26.0–34.0)
MCHC: 35.3 g/dL (ref 30.0–36.0)
MCV: 93.9 fL (ref 80.0–100.0)
Monocytes Absolute: 0.4 10*3/uL (ref 0.1–1.0)
Monocytes Relative: 10 %
Neutro Abs: 2.2 10*3/uL (ref 1.7–7.7)
Neutrophils Relative %: 57 %
Platelet Count: 141 10*3/uL — ABNORMAL LOW (ref 150–400)
RBC: 4.25 MIL/uL (ref 4.22–5.81)
RDW: 12.5 % (ref 11.5–15.5)
WBC Count: 3.8 10*3/uL — ABNORMAL LOW (ref 4.0–10.5)
nRBC: 0 % (ref 0.0–0.2)

## 2023-03-15 LAB — CMP (CANCER CENTER ONLY)
ALT: 17 U/L (ref 0–44)
AST: 17 U/L (ref 15–41)
Albumin: 4.1 g/dL (ref 3.5–5.0)
Alkaline Phosphatase: 59 U/L (ref 38–126)
Anion gap: 5 (ref 5–15)
BUN: 13 mg/dL (ref 8–23)
CO2: 26 mmol/L (ref 22–32)
Calcium: 9.3 mg/dL (ref 8.9–10.3)
Chloride: 108 mmol/L (ref 98–111)
Creatinine: 1.84 mg/dL — ABNORMAL HIGH (ref 0.61–1.24)
GFR, Estimated: 37 mL/min — ABNORMAL LOW (ref >60)
Glucose, Bld: 135 mg/dL — ABNORMAL HIGH (ref 70–99)
Potassium: 4.2 mmol/L (ref 3.5–5.1)
Sodium: 139 mmol/L (ref 135–145)
Total Bilirubin: 0.5 mg/dL (ref 0.3–1.2)
Total Protein: 6.8 g/dL (ref 6.5–8.1)

## 2023-03-15 LAB — TSH: TSH: 7.271 u[IU]/mL — ABNORMAL HIGH (ref 0.350–4.500)

## 2023-03-15 MED ORDER — SODIUM CHLORIDE 0.9 % IV SOLN: Freq: Once | INTRAVENOUS | Status: AC

## 2023-03-15 MED ORDER — SODIUM CHLORIDE 0.9 % IV SOLN
1500.0000 mg | Freq: Once | INTRAVENOUS | Status: AC
  Administered 2023-03-15: 1500 mg via INTRAVENOUS
  Filled 2023-03-15: qty 30

## 2023-03-15 NOTE — Progress Notes (Signed)
Per Cassie, PA ok to treat with creat 1.84 mg/dL

## 2023-03-15 NOTE — Patient Instructions (Signed)
[facility]  Discharge Instructions: Thank you for choosing [facility] to provide your oncology and hematology care.   If you have a lab appointment with the Cancer Center, please go directly to the Cancer Center and check in at the registration area.   Wear comfortable clothing and clothing appropriate for easy access to any Portacath or PICC line.   We strive to give you quality time with your provider. You may need to reschedule your appointment if you arrive late (15 or more minutes).  Arriving late affects you and other patients whose appointments are after yours.  Also, if you miss three or more appointments without notifying the office, you may be dismissed from the clinic at the provider's discretion.      For prescription refill requests, have your pharmacy contact our office and allow 72 hours for refills to be completed.    Today you received the following chemotherapy and/or immunotherapy agents: Durvalumab      To help prevent nausea and vomiting after your treatment, we encourage you to take your nausea medication as directed.  BELOW ARE SYMPTOMS THAT SHOULD BE REPORTED IMMEDIATELY: *FEVER GREATER THAN 100.4 F (38 C) OR HIGHER *CHILLS OR SWEATING *NAUSEA AND VOMITING THAT IS NOT CONTROLLED WITH YOUR NAUSEA MEDICATION *UNUSUAL SHORTNESS OF BREATH *UNUSUAL BRUISING OR BLEEDING *URINARY PROBLEMS (pain or burning when urinating, or frequent urination) *BOWEL PROBLEMS (unusual diarrhea, constipation, pain near the anus) TENDERNESS IN MOUTH AND THROAT WITH OR WITHOUT PRESENCE OF ULCERS (sore throat, sores in mouth, or a toothache) UNUSUAL RASH, SWELLING OR PAIN  UNUSUAL VAGINAL DISCHARGE OR ITCHING   Items with * indicate a potential emergency and should be followed up as soon as possible or go to the Emergency Department if any problems should occur.  Please show the CHEMOTHERAPY ALERT CARD or IMMUNOTHERAPY ALERT CARD at  check-in to the Emergency Department and triage nurse.  Should you have questions after your visit or need to cancel or reschedule your appointment, please contact [facility]  Dept: [phone]  and follow the prompts.  Office hours are 8:00 a.m. to 4:30 p.m. Monday - Friday. Please note that voicemails left after 4:00 p.m. may not be returned until the following business day.  We are closed weekends and major holidays. You have access to a nurse at all times for urgent questions. Please call the main number to the clinic Dept: [phone] and follow the prompts.   For any non-urgent questions, you may also contact your provider using MyChart. We now offer e-Visits for anyone 18 and older to request care online for non-urgent symptoms. For details visit [email or web address].   Also download the MyChart app! Go to the app store, search "MyChart", open the app, select [facility], and log in with your MyChart username and password.   

## 2023-03-17 ENCOUNTER — Other Ambulatory Visit: Payer: Self-pay

## 2023-03-17 LAB — T4: T4, Total: 9.6 ug/dL (ref 4.5–12.0)

## 2023-04-10 ENCOUNTER — Encounter (HOSPITAL_COMMUNITY): Payer: Self-pay

## 2023-04-10 ENCOUNTER — Ambulatory Visit (HOSPITAL_COMMUNITY)
Admission: RE | Admit: 2023-04-10 | Discharge: 2023-04-10 | Disposition: A | Discharge status: Home / Self Care | Payer: PPO | Source: Ambulatory Visit | Attending: Physician Assistant | Admitting: Physician Assistant

## 2023-04-10 DIAGNOSIS — C3492 Malignant neoplasm of unspecified part of left bronchus or lung: Secondary | ICD-10-CM

## 2023-04-11 ENCOUNTER — Other Ambulatory Visit: Payer: Self-pay

## 2023-04-11 DIAGNOSIS — C3492 Malignant neoplasm of unspecified part of left bronchus or lung: Secondary | ICD-10-CM

## 2023-04-12 ENCOUNTER — Inpatient Hospital Stay: Payer: PPO | Attending: Internal Medicine

## 2023-04-12 ENCOUNTER — Other Ambulatory Visit: Payer: Self-pay

## 2023-04-12 ENCOUNTER — Inpatient Hospital Stay (HOSPITAL_BASED_OUTPATIENT_CLINIC_OR_DEPARTMENT_OTHER): Payer: PPO | Admitting: Internal Medicine

## 2023-04-12 VITALS — BP 137/65 | HR 45 | Temp 97.4°F | Resp 17 | Wt 176.5 lb

## 2023-04-12 DIAGNOSIS — C3412 Malignant neoplasm of upper lobe, left bronchus or lung: Secondary | ICD-10-CM | POA: Insufficient documentation

## 2023-04-12 DIAGNOSIS — I700 Atherosclerosis of aorta: Secondary | ICD-10-CM | POA: Diagnosis not present

## 2023-04-12 DIAGNOSIS — Z923 Personal history of irradiation: Secondary | ICD-10-CM | POA: Diagnosis not present

## 2023-04-12 DIAGNOSIS — J449 Chronic obstructive pulmonary disease, unspecified: Secondary | ICD-10-CM | POA: Diagnosis not present

## 2023-04-12 DIAGNOSIS — Z79899 Other long term (current) drug therapy: Secondary | ICD-10-CM | POA: Insufficient documentation

## 2023-04-12 DIAGNOSIS — C3490 Malignant neoplasm of unspecified part of unspecified bronchus or lung: Secondary | ICD-10-CM | POA: Diagnosis not present

## 2023-04-12 DIAGNOSIS — R5383 Other fatigue: Secondary | ICD-10-CM | POA: Insufficient documentation

## 2023-04-12 DIAGNOSIS — C3492 Malignant neoplasm of unspecified part of left bronchus or lung: Secondary | ICD-10-CM

## 2023-04-12 LAB — CBC WITH DIFFERENTIAL/PLATELET
Abs Immature Granulocytes: 0.01 10*3/uL (ref 0.00–0.07)
Basophils Absolute: 0 10*3/uL (ref 0.0–0.1)
Basophils Relative: 0 %
Eosinophils Absolute: 0.1 10*3/uL (ref 0.0–0.5)
Eosinophils Relative: 3 %
HCT: 39.4 % (ref 39.0–52.0)
Hemoglobin: 13.7 g/dL (ref 13.0–17.0)
Immature Granulocytes: 0 %
Lymphocytes Relative: 30 %
Lymphs Abs: 1.4 10*3/uL (ref 0.7–4.0)
MCH: 32.7 pg (ref 26.0–34.0)
MCHC: 34.8 g/dL (ref 30.0–36.0)
MCV: 94 fL (ref 80.0–100.0)
Monocytes Absolute: 0.5 10*3/uL (ref 0.1–1.0)
Monocytes Relative: 11 %
Neutro Abs: 2.7 10*3/uL (ref 1.7–7.7)
Neutrophils Relative %: 56 %
Platelets: 137 10*3/uL — ABNORMAL LOW (ref 150–400)
RBC: 4.19 MIL/uL — ABNORMAL LOW (ref 4.22–5.81)
RDW: 12.5 % (ref 11.5–15.5)
WBC: 4.8 10*3/uL (ref 4.0–10.5)
nRBC: 0 % (ref 0.0–0.2)

## 2023-04-12 LAB — COMPREHENSIVE METABOLIC PANEL
ALT: 11 U/L (ref 0–44)
AST: 11 U/L — ABNORMAL LOW (ref 15–41)
Albumin: 3.9 g/dL (ref 3.5–5.0)
Alkaline Phosphatase: 57 U/L (ref 38–126)
Anion gap: 3 — ABNORMAL LOW (ref 5–15)
BUN: 15 mg/dL (ref 8–23)
CO2: 30 mmol/L (ref 22–32)
Calcium: 8.8 mg/dL — ABNORMAL LOW (ref 8.9–10.3)
Chloride: 106 mmol/L (ref 98–111)
Creatinine, Ser: 1.83 mg/dL — ABNORMAL HIGH (ref 0.61–1.24)
GFR, Estimated: 38 mL/min — ABNORMAL LOW (ref >60)
Glucose, Bld: 81 mg/dL (ref 70–99)
Potassium: 4.2 mmol/L (ref 3.5–5.1)
Sodium: 139 mmol/L (ref 135–145)
Total Bilirubin: 0.6 mg/dL (ref 0.3–1.2)
Total Protein: 6.5 g/dL (ref 6.5–8.1)

## 2023-04-12 NOTE — Progress Notes (Signed)
[facility] Telephone:(336) [phone]   Fax:([phone]  OFFICE PROGRESS NOTE  Gwenlyn Found, MD [address] [place] [address]  DIAGNOSIS: Recurrent non-small cell lung cancer initially diagnosed as stage IA (T1b, N0, M0) non-small cell lung cancer, adenocarcinoma presented with left upper lobe lung nodule diagnosed in January 2020.  The patient has evidence for disease recurrence in AP window lymphadenopathy in December 2022.   There was insufficient material for the molecular studies by [facility].   PRIOR THERAPY:  1) Status post left lingulectomy with lymph node dissection under the care of Dr. Tyrone Sage on January 01, 2019. 2) Concurrent chemoradiation with carboplatin for an AUC of 2 and paclitaxel 45 mg per metered squared weekly.  First dose on 01/24/2022.  Status post 7 cycles. Last dose was given March 14, 2022. 3) Consolidation treatment with immunotherapy with Imfinzi 1500 Mg IV every 4 weeks.  First dose Apr 12, 2022.  Status post 13 cycles.   CURRENT THERAPY: Observation  INTERVAL HISTORY: Jack Taylor 77 y.o. male returns to the clinic today for follow-up visit accompanied by his wife.  The patient is feeling fine today with no concerning complaints.  He denied having any chest pain, shortness of breath except with exertion with no cough or hemoptysis.  He has no nausea, vomiting, diarrhea or constipation.  He has no headache or visual changes.  He denied having any weight loss or night sweats.  He completed a course of consolidation treatment with immunotherapy and tolerated it fairly well.  He is here today for evaluation with repeat CT scan of the chest for restaging of his disease.   MEDICAL HISTORY: Past Medical History:  Diagnosis Date   Adenomatous colon polyp    Anxiety    Blood transfusion without reported diagnosis    Cancer (HCC)    Lung; 11/2018   CKD (chronic kidney disease)    stage III 11/09/18   COPD  (chronic obstructive pulmonary disease) (HCC)    Gastric ulcer    GERD (gastroesophageal reflux disease)    High cholesterol    History of radiation therapy    Left Lung- 02/17/22-03/18/22-Dr. Antony Blackbird   Hypertension    Pre-diabetes    A1c 6.2% 11/09/18    ALLERGIES:  has No Known Allergies.  MEDICATIONS:  Current Outpatient Medications  Medication Sig Dispense Refill   albuterol (PROVENTIL HFA;VENTOLIN HFA) 108 (90 Base) MCG/ACT inhaler Inhale 2 puffs into the lungs every 6 (six) hours as needed for wheezing or shortness of breath. Reported on 06/02/2016     amLODipine (NORVASC) 10 MG tablet Take 10 mg by mouth daily.      ANORO ELLIPTA 62.5-25 MCG/ACT AEPB Inhale 1 puff into the lungs daily.     diazepam (VALIUM) 2 MG tablet Take by mouth. (Patient not taking: Reported on 08/31/2022)     esomeprazole (NEXIUM) 40 MG capsule Take 40 mg by mouth daily.      levothyroxine (SYNTHROID) 100 MCG tablet Take 1 tablet (100 mcg total) by mouth daily before breakfast. 30 tablet 2   losartan (COZAAR) 50 MG tablet Take 50 mg by mouth daily.      OVER THE COUNTER MEDICATION Take 1 tablet by mouth daily as needed (for constipation).     prochlorperazine (COMPAZINE) 10 MG tablet TAKE 1 TABLET BY MOUTH EVERY 6 HOURS AS NEEDED 30 tablet 0   rosuvastatin (CRESTOR) 10 MG tablet Take 10 mg by mouth daily.  sucralfate (CARAFATE) 1 g tablet Take 1 tablet (1 g total) by mouth 4 (four) times daily -  with meals and at bedtime. Crush and dissolve in 10 mL of warm water prior to swallowing 120 tablet 1   traZODone (DESYREL) 50 MG tablet Take 50 mg by mouth at bedtime.     No current facility-administered medications for this visit.    SURGICAL HISTORY:  Past Surgical History:  Procedure Laterality Date   BRONCHIAL NEEDLE ASPIRATION BIOPSY  01/10/2022   Procedure: BRONCHIAL NEEDLE ASPIRATION BIOPSIES;  Surgeon: Leslye Peer, MD;  Location: [facility] ENDOSCOPY;  Service: Cardiopulmonary;;   COLONOSCOPY  2008    ESOPHAGOGASTRODUODENOSCOPY  11/23/2011   Procedure: ESOPHAGOGASTRODUODENOSCOPY (EGD);  Surgeon: Rob Bunting, MD;  Location: [facility] ENDOSCOPY;  Service: Endoscopy;  Laterality: N/A;   INTERCOSTAL NERVE BLOCK Left 01/01/2019   Procedure: INTERCOSTAL NERVE BLOCK;  Surgeon: Delight Ovens, MD;  Location: [facility] OR;  Service: Thoracic;  Laterality: Left;   LUNG BIOPSY  12/18/2018   Procedure: LUNG BIOPSY - of Left Upper Lobe Biopsy of #7 Node Biopsy of #10L Node;  Surgeon: Delight Ovens, MD;  Location: [facility] OR;  Service: Thoracic;;   LYMPH NODE DISSECTION Left 01/01/2019   Procedure: LYMPH NODE DISSECTION;  Surgeon: Delight Ovens, MD;  Location: [facility] OR;  Service: Thoracic;  Laterality: Left;   UPPER GASTROINTESTINAL ENDOSCOPY  11/23/11   VIDEO ASSISTED THORACOSCOPY (VATS)/WEDGE RESECTION Left 01/01/2019   Procedure: VIDEO ASSISTED THORACOSCOPY (VATS)/ LEFT LINGULECTOMY;  Surgeon: Delight Ovens, MD;  Location: [facility] OR;  Service: Thoracic;  Laterality: Left;   VIDEO BRONCHOSCOPY WITH ENDOBRONCHIAL NAVIGATION N/A 12/18/2018   Procedure: VIDEO BRONCHOSCOPY WITH ENDOBRONCHIAL NAVIGATION with Placement of Fiducial Markers x 3;  Surgeon: Delight Ovens, MD;  Location: [facility] OR;  Service: Thoracic;  Laterality: N/A;   VIDEO BRONCHOSCOPY WITH ENDOBRONCHIAL ULTRASOUND N/A 12/18/2018   Procedure: VIDEO BRONCHOSCOPY WITH ENDOBRONCHIAL ULTRASOUND;  Surgeon: Delight Ovens, MD;  Location: [facility] OR;  Service: Thoracic;  Laterality: N/A;   VIDEO BRONCHOSCOPY WITH ENDOBRONCHIAL ULTRASOUND Left 01/10/2022   Procedure: VIDEO BRONCHOSCOPY WITH ENDOBRONCHIAL ULTRASOUND;  Surgeon: Leslye Peer, MD;  Location: [facility] ENDOSCOPY;  Service: Cardiopulmonary;  Laterality: Left;    REVIEW OF SYSTEMS:  Constitutional: positive for fatigue Eyes: negative Ears, nose, mouth, throat, and face: negative Respiratory: negative Cardiovascular: negative Gastrointestinal: negative Genitourinary:negative Integument/breast:  negative Hematologic/lymphatic: negative Musculoskeletal:negative Neurological: negative Behavioral/Psych: negative Endocrine: negative Allergic/Immunologic: negative   PHYSICAL EXAMINATION: General appearance: alert, cooperative, fatigued, and no distress Head: Normocephalic, without obvious abnormality, atraumatic Neck: no adenopathy, no JVD, supple, symmetrical, trachea midline, and thyroid not enlarged, symmetric, no tenderness/mass/nodules Lymph nodes: Cervical, supraclavicular, and axillary nodes normal. Resp: clear to auscultation bilaterally Back: symmetric, no curvature. ROM normal. No CVA tenderness. Cardio: regular rate and rhythm, S1, S2 normal, no murmur, click, rub or gallop GI: soft, non-tender; bowel sounds normal; no masses,  no organomegaly Extremities: extremities normal, atraumatic, no cyanosis or edema Neurologic: Alert and oriented X 3, normal strength and tone. Normal symmetric reflexes. Normal coordination and gait  ECOG PERFORMANCE STATUS: 1 - Symptomatic but completely ambulatory  Blood pressure 137/65, pulse (!) 45, temperature (!) 97.4 F (36.3 C), temperature source Oral, resp. rate 17, weight 176 lb 8 oz (80.1 kg), SpO2 95 %.  LABORATORY DATA: Lab Results  Component Value Date   WBC 4.8 04/12/2023   HGB 13.7 04/12/2023   HCT 39.4 04/12/2023   MCV 94.0 04/12/2023   PLT 137 (L) 04/12/2023  Chemistry      Component Value Date/Time   NA 139 04/12/2023 1234   K 4.2 04/12/2023 1234   CL 106 04/12/2023 1234   CO2 30 04/12/2023 1234   BUN 15 04/12/2023 1234   CREATININE 1.83 (H) 04/12/2023 1234   CREATININE 1.84 (H) 03/15/2023 0842      Component Value Date/Time   CALCIUM 8.8 (L) 04/12/2023 1234   ALKPHOS 57 04/12/2023 1234   AST 11 (L) 04/12/2023 1234   AST 17 03/15/2023 0842   ALT 11 04/12/2023 1234   ALT 17 03/15/2023 0842   BILITOT 0.6 04/12/2023 1234   BILITOT 0.5 03/15/2023 0842       RADIOGRAPHIC STUDIES: CT Chest Wo  Contrast  Result Date: 04/12/2023 CLINICAL DATA:  Non-small-cell lung cancer. Restaging. * Tracking Code: BO * EXAM: CT CHEST WITHOUT CONTRAST TECHNIQUE: Multidetector CT imaging of the chest was performed following the standard protocol without IV contrast. RADIATION DOSE REDUCTION: This exam was performed according to the departmental dose-optimization program which includes automated exposure control, adjustment of the mA and/or kV according to patient size and/or use of iterative reconstruction technique. COMPARISON:  12/19/2022 FINDINGS: Cardiovascular: The heart size is normal. No substantial pericardial effusion. Coronary artery calcification is evident. Mild atherosclerotic calcification is noted in the wall of the thoracic aorta. Mediastinum/Nodes: No mediastinal lymphadenopathy. No evidence for gross hilar lymphadenopathy although assessment is limited by the lack of intravenous contrast on the current study. The esophagus has normal imaging features. There is no axillary lymphadenopathy. Lungs/Pleura: Subsegmental atelectasis or linear scarring again noted posterior right lower lobe. Left upper lobe staple line with associated scarring is unchanged. 3 mm subpleural nodule left base on 130/7 is unchanged. 3 mm right upper lobe perifissural nodule on 96/7 is stable. Neither of these nodules requires any particular imaging follow-up no new suspicious pulmonary nodule or mass on the current exam. No focal airspace consolidation. No pleural effusion. Upper Abdomen: Unremarkable. Musculoskeletal: No worrisome lytic or sclerotic osseous abnormality. IMPRESSION: 1. Stable exam. No new or progressive findings to suggest recurrent or metastatic disease. 2.  Aortic Atherosclerosis (ICD10-I70.0). Electronically Signed   By: Kennith Center M.D.   On: 04/12/2023 13:23    ASSESSMENT AND PLAN: This is a very pleasant 77 years old white male diagnosed with recurrent non-small cell lung cancer that was initially  diagnosed as stage IA (T1b, N0, M0) non-small cell lung cancer, adenocarcinoma presented with left upper lobe lung nodule in January 2020.  The patient has evidence for disease recurrence in AP window lymphadenopathy in December 2022.  He is status post left lingulectomy with lymph node dissection under the care of Dr. Tyrone Sage on January 01, 2019.  This was biopsy-proven to be recurrent adenocarcinoma of the lung. The patient underwent a course of concurrent chemoradiation with weekly carboplatin for AUC of 2 and paclitaxel 45 Mg/M2 status post 7 cycles.  Last dose was given March 14, 2022.  The patient has partial response to this treatment. The patient underwent consolidation treatment with immunotherapy with Imfinzi 1500 Mg IV every 4 weeks status post 13 cycles.   The patient tolerated his treatment fairly well with no concerning adverse effects. He had repeat CT scan of the chest performed recently.  I personally and independently reviewed the scan and discussed the result with the patient today. His scan showed no concerning findings for disease recurrence or metastasis. I recommended for him to continue on observation with repeat CT scan of the chest in 3 months.  For the hypothyroidism, he will continue his current treatment with levothyroxine. The patient was advised to call immediately if he has any concerning symptoms in the interval. The patient voices understanding of current disease status and treatment options and is in agreement with the current care plan.  All questions were answered. The patient knows to call the clinic with any problems, questions or concerns. We can certainly see the patient much sooner if necessary.  Disclaimer: This note was dictated with voice recognition software. Similar sounding words can inadvertently be transcribed and may not be corrected upon review.

## 2023-04-13 ENCOUNTER — Other Ambulatory Visit: Payer: Self-pay

## 2023-04-14 ENCOUNTER — Other Ambulatory Visit: Payer: Self-pay

## 2023-05-16 ENCOUNTER — Other Ambulatory Visit: Payer: Self-pay | Admitting: Physician Assistant

## 2023-05-16 DIAGNOSIS — E039 Hypothyroidism, unspecified: Secondary | ICD-10-CM

## 2023-06-18 ENCOUNTER — Other Ambulatory Visit: Payer: Self-pay | Admitting: Physician Assistant

## 2023-06-18 DIAGNOSIS — E039 Hypothyroidism, unspecified: Secondary | ICD-10-CM

## 2023-06-19 ENCOUNTER — Encounter: Payer: Self-pay | Admitting: Internal Medicine

## 2023-07-10 ENCOUNTER — Other Ambulatory Visit: Payer: Self-pay

## 2023-07-10 ENCOUNTER — Ambulatory Visit (HOSPITAL_COMMUNITY)
Admission: RE | Admit: 2023-07-10 | Discharge: 2023-07-10 | Disposition: A | Discharge status: Home / Self Care | Payer: PPO | Source: Ambulatory Visit | Attending: Internal Medicine | Admitting: Internal Medicine

## 2023-07-10 ENCOUNTER — Inpatient Hospital Stay: Payer: PPO | Attending: Internal Medicine

## 2023-07-10 DIAGNOSIS — I517 Cardiomegaly: Secondary | ICD-10-CM | POA: Insufficient documentation

## 2023-07-10 DIAGNOSIS — R5383 Other fatigue: Secondary | ICD-10-CM | POA: Insufficient documentation

## 2023-07-10 DIAGNOSIS — Z923 Personal history of irradiation: Secondary | ICD-10-CM | POA: Insufficient documentation

## 2023-07-10 DIAGNOSIS — C3492 Malignant neoplasm of unspecified part of left bronchus or lung: Secondary | ICD-10-CM | POA: Insufficient documentation

## 2023-07-10 DIAGNOSIS — Z79899 Other long term (current) drug therapy: Secondary | ICD-10-CM | POA: Insufficient documentation

## 2023-07-10 DIAGNOSIS — C3490 Malignant neoplasm of unspecified part of unspecified bronchus or lung: Secondary | ICD-10-CM

## 2023-07-10 DIAGNOSIS — C3412 Malignant neoplasm of upper lobe, left bronchus or lung: Secondary | ICD-10-CM | POA: Insufficient documentation

## 2023-07-10 LAB — CBC WITH DIFFERENTIAL (CANCER CENTER ONLY)
Abs Immature Granulocytes: 0.01 10*3/uL (ref 0.00–0.07)
Basophils Absolute: 0 10*3/uL (ref 0.0–0.1)
Basophils Relative: 1 %
Eosinophils Absolute: 0.2 10*3/uL (ref 0.0–0.5)
Eosinophils Relative: 3 %
HCT: 39.3 % (ref 39.0–52.0)
Hemoglobin: 14 g/dL (ref 13.0–17.0)
Immature Granulocytes: 0 %
Lymphocytes Relative: 33 %
Lymphs Abs: 1.5 10*3/uL (ref 0.7–4.0)
MCH: 32.7 pg (ref 26.0–34.0)
MCHC: 35.6 g/dL (ref 30.0–36.0)
MCV: 91.8 fL (ref 80.0–100.0)
Monocytes Absolute: 0.5 10*3/uL (ref 0.1–1.0)
Monocytes Relative: 11 %
Neutro Abs: 2.4 10*3/uL (ref 1.7–7.7)
Neutrophils Relative %: 52 %
Platelet Count: 146 10*3/uL — ABNORMAL LOW (ref 150–400)
RBC: 4.28 MIL/uL (ref 4.22–5.81)
RDW: 13 % (ref 11.5–15.5)
WBC Count: 4.7 10*3/uL (ref 4.0–10.5)
nRBC: 0 % (ref 0.0–0.2)

## 2023-07-10 LAB — CMP (CANCER CENTER ONLY)
ALT: 17 U/L (ref 0–44)
AST: 15 U/L (ref 15–41)
Albumin: 4 g/dL (ref 3.5–5.0)
Alkaline Phosphatase: 54 U/L (ref 38–126)
Anion gap: 6 (ref 5–15)
BUN: 18 mg/dL (ref 8–23)
CO2: 27 mmol/L (ref 22–32)
Calcium: 9 mg/dL (ref 8.9–10.3)
Chloride: 106 mmol/L (ref 98–111)
Creatinine: 1.98 mg/dL — ABNORMAL HIGH (ref 0.61–1.24)
GFR, Estimated: 34 mL/min — ABNORMAL LOW (ref >60)
Glucose, Bld: 94 mg/dL (ref 70–99)
Potassium: 4.4 mmol/L (ref 3.5–5.1)
Sodium: 139 mmol/L (ref 135–145)
Total Bilirubin: 0.6 mg/dL (ref 0.3–1.2)
Total Protein: 6.7 g/dL (ref 6.5–8.1)

## 2023-07-10 LAB — TSH: TSH: 39.563 u[IU]/mL — ABNORMAL HIGH (ref 0.350–4.500)

## 2023-07-13 ENCOUNTER — Inpatient Hospital Stay (HOSPITAL_BASED_OUTPATIENT_CLINIC_OR_DEPARTMENT_OTHER): Payer: PPO | Admitting: Internal Medicine

## 2023-07-13 ENCOUNTER — Other Ambulatory Visit: Payer: Self-pay

## 2023-07-13 VITALS — BP 106/55 | HR 51 | Temp 97.5°F | Resp 16 | Wt 179.7 lb

## 2023-07-13 DIAGNOSIS — C3412 Malignant neoplasm of upper lobe, left bronchus or lung: Secondary | ICD-10-CM | POA: Diagnosis present

## 2023-07-13 DIAGNOSIS — C3490 Malignant neoplasm of unspecified part of unspecified bronchus or lung: Secondary | ICD-10-CM | POA: Diagnosis not present

## 2023-07-13 DIAGNOSIS — Z923 Personal history of irradiation: Secondary | ICD-10-CM | POA: Diagnosis not present

## 2023-07-13 DIAGNOSIS — R5383 Other fatigue: Secondary | ICD-10-CM | POA: Diagnosis not present

## 2023-07-13 DIAGNOSIS — Z79899 Other long term (current) drug therapy: Secondary | ICD-10-CM | POA: Diagnosis not present

## 2023-07-13 NOTE — Progress Notes (Signed)
[facility] Telephone:([phone]   Fax:(336) [phone]  OFFICE PROGRESS NOTE  Gwenlyn Found, MD [address] [place] [address]  DIAGNOSIS: Recurrent non-small cell lung cancer initially diagnosed as stage IA (T1b, N0, M0) non-small cell lung cancer, adenocarcinoma presented with left upper lobe lung nodule diagnosed in January 2020.  The patient has evidence for disease recurrence in AP window lymphadenopathy in December 2022.   There was insufficient material for the molecular studies by [facility].   PRIOR THERAPY:  1) Status post left lingulectomy with lymph node dissection under the care of Dr. Tyrone Sage on January 01, 2019. 2) Concurrent chemoradiation with carboplatin for an AUC of 2 and paclitaxel 45 mg per metered squared weekly.  First dose on 01/24/2022.  Status post 7 cycles. Last dose was given March 14, 2022. 3) Consolidation treatment with immunotherapy with Imfinzi 1500 Mg IV every 4 weeks.  First dose Apr 12, 2022.  Status post 13 cycles.   CURRENT THERAPY: Observation  INTERVAL HISTORY: Jack Taylor 77 y.o. male returns to the clinic today for 45-month follow-up visit accompanied by his wife.  The patient is feeling fine today with no concerning complaints except for mild fatigue.  He has no chest pain, shortness of breath, cough or hemoptysis.  He has no nausea, vomiting, diarrhea or constipation.  He has no headache or visual changes.  He has no recent weight loss or night sweats.  He is here today for evaluation with repeat CT scan of the chest for restaging of his disease.   MEDICAL HISTORY: Past Medical History:  Diagnosis Date   Adenomatous colon polyp    Anxiety    Blood transfusion without reported diagnosis    Cancer (HCC)    Lung; 11/2018   CKD (chronic kidney disease)    stage III 11/09/18   COPD (chronic obstructive pulmonary disease) (HCC)    Gastric ulcer    GERD (gastroesophageal reflux disease)    High  cholesterol    History of radiation therapy    Left Lung- 02/17/22-03/18/22-Dr. Antony Blackbird   Hypertension    Pre-diabetes    A1c 6.2% 11/09/18    ALLERGIES:  has No Known Allergies.  MEDICATIONS:  Current Outpatient Medications  Medication Sig Dispense Refill   albuterol (PROVENTIL HFA;VENTOLIN HFA) 108 (90 Base) MCG/ACT inhaler Inhale 2 puffs into the lungs every 6 (six) hours as needed for wheezing or shortness of breath. Reported on 06/02/2016     amLODipine (NORVASC) 10 MG tablet Take 10 mg by mouth daily.      ANORO ELLIPTA 62.5-25 MCG/ACT AEPB Inhale 1 puff into the lungs daily.     diazepam (VALIUM) 2 MG tablet Take by mouth. (Patient not taking: Reported on 08/31/2022)     esomeprazole (NEXIUM) 40 MG capsule Take 40 mg by mouth daily.      levothyroxine (SYNTHROID) 100 MCG tablet TAKE 1 TABLET BY MOUTH ONCE DAILY BEFORE BREAKFAST 30 tablet 0   losartan (COZAAR) 50 MG tablet Take 50 mg by mouth daily.      OVER THE COUNTER MEDICATION Take 1 tablet by mouth daily as needed (for constipation).     prochlorperazine (COMPAZINE) 10 MG tablet TAKE 1 TABLET BY MOUTH EVERY 6 HOURS AS NEEDED 30 tablet 0   rosuvastatin (CRESTOR) 10 MG tablet Take 10 mg by mouth daily.     sucralfate (CARAFATE) 1 g tablet Take 1 tablet (1 g total) by mouth 4 (  four) times daily -  with meals and at bedtime. Crush and dissolve in 10 mL of warm water prior to swallowing 120 tablet 1   traZODone (DESYREL) 50 MG tablet Take 50 mg by mouth at bedtime.     No current facility-administered medications for this visit.    SURGICAL HISTORY:  Past Surgical History:  Procedure Laterality Date   BRONCHIAL NEEDLE ASPIRATION BIOPSY  01/10/2022   Procedure: BRONCHIAL NEEDLE ASPIRATION BIOPSIES;  Surgeon: Leslye Peer, MD;  Location: [facility] ENDOSCOPY;  Service: Cardiopulmonary;;   COLONOSCOPY  2008   ESOPHAGOGASTRODUODENOSCOPY  11/23/2011   Procedure: ESOPHAGOGASTRODUODENOSCOPY (EGD);  Surgeon: Rob Bunting, MD;   Location: [facility] ENDOSCOPY;  Service: Endoscopy;  Laterality: N/A;   INTERCOSTAL NERVE BLOCK Left 01/01/2019   Procedure: INTERCOSTAL NERVE BLOCK;  Surgeon: Delight Ovens, MD;  Location: [facility] OR;  Service: Thoracic;  Laterality: Left;   LUNG BIOPSY  12/18/2018   Procedure: LUNG BIOPSY - of Left Upper Lobe Biopsy of #7 Node Biopsy of #10L Node;  Surgeon: Delight Ovens, MD;  Location: [facility] OR;  Service: Thoracic;;   LYMPH NODE DISSECTION Left 01/01/2019   Procedure: LYMPH NODE DISSECTION;  Surgeon: Delight Ovens, MD;  Location: [facility] OR;  Service: Thoracic;  Laterality: Left;   UPPER GASTROINTESTINAL ENDOSCOPY  11/23/11   VIDEO ASSISTED THORACOSCOPY (VATS)/WEDGE RESECTION Left 01/01/2019   Procedure: VIDEO ASSISTED THORACOSCOPY (VATS)/ LEFT LINGULECTOMY;  Surgeon: Delight Ovens, MD;  Location: [facility] OR;  Service: Thoracic;  Laterality: Left;   VIDEO BRONCHOSCOPY WITH ENDOBRONCHIAL NAVIGATION N/A 12/18/2018   Procedure: VIDEO BRONCHOSCOPY WITH ENDOBRONCHIAL NAVIGATION with Placement of Fiducial Markers x 3;  Surgeon: Delight Ovens, MD;  Location: [facility] OR;  Service: Thoracic;  Laterality: N/A;   VIDEO BRONCHOSCOPY WITH ENDOBRONCHIAL ULTRASOUND N/A 12/18/2018   Procedure: VIDEO BRONCHOSCOPY WITH ENDOBRONCHIAL ULTRASOUND;  Surgeon: Delight Ovens, MD;  Location: [facility] OR;  Service: Thoracic;  Laterality: N/A;   VIDEO BRONCHOSCOPY WITH ENDOBRONCHIAL ULTRASOUND Left 01/10/2022   Procedure: VIDEO BRONCHOSCOPY WITH ENDOBRONCHIAL ULTRASOUND;  Surgeon: Leslye Peer, MD;  Location: [facility] ENDOSCOPY;  Service: Cardiopulmonary;  Laterality: Left;    REVIEW OF SYSTEMS:  A comprehensive review of systems was negative except for: Constitutional: positive for fatigue   PHYSICAL EXAMINATION: General appearance: alert, cooperative, fatigued, and no distress Head: Normocephalic, without obvious abnormality, atraumatic Neck: no adenopathy, no JVD, supple, symmetrical, trachea midline, and thyroid not enlarged,  symmetric, no tenderness/mass/nodules Lymph nodes: Cervical, supraclavicular, and axillary nodes normal. Resp: clear to auscultation bilaterally Back: symmetric, no curvature. ROM normal. No CVA tenderness. Cardio: regular rate and rhythm, S1, S2 normal, no murmur, click, rub or gallop GI: soft, non-tender; bowel sounds normal; no masses,  no organomegaly Extremities: extremities normal, atraumatic, no cyanosis or edema  ECOG PERFORMANCE STATUS: 1 - Symptomatic but completely ambulatory  Blood pressure (!) 106/55, pulse (!) 51, temperature (!) 97.5 F (36.4 C), temperature source Temporal, resp. rate 16, weight 179 lb 11.2 oz (81.5 kg), SpO2 94%.  LABORATORY DATA: Lab Results  Component Value Date   WBC 4.7 07/10/2023   HGB 14.0 07/10/2023   HCT 39.3 07/10/2023   MCV 91.8 07/10/2023   PLT 146 (L) 07/10/2023      Chemistry      Component Value Date/Time   NA 139 07/10/2023 0959   K 4.4 07/10/2023 0959   CL 106 07/10/2023 0959   CO2 27 07/10/2023 0959   BUN 18 07/10/2023 0959   CREATININE 1.98 (H) 07/10/2023 0959  Component Value Date/Time   CALCIUM 9.0 07/10/2023 0959   ALKPHOS 54 07/10/2023 0959   AST 15 07/10/2023 0959   ALT 17 07/10/2023 0959   BILITOT 0.6 07/10/2023 0959       RADIOGRAPHIC STUDIES: No results found.  ASSESSMENT AND PLAN: This is a very pleasant 77 years old white male diagnosed with recurrent non-small cell lung cancer that was initially diagnosed as stage IA (T1b, N0, M0) non-small cell lung cancer, adenocarcinoma presented with left upper lobe lung nodule in January 2020.  The patient has evidence for disease recurrence in AP window lymphadenopathy in December 2022.  He is status post left lingulectomy with lymph node dissection under the care of Dr. Tyrone Sage on January 01, 2019.  This was biopsy-proven to be recurrent adenocarcinoma of the lung. The patient underwent a course of concurrent chemoradiation with weekly carboplatin for AUC of 2  and paclitaxel 45 Mg/M2 status post 7 cycles.  Last dose was given March 14, 2022.  The patient has partial response to this treatment. The patient underwent consolidation treatment with immunotherapy with Imfinzi 1500 Mg IV every 4 weeks status post 13 cycles.   The patient tolerated his treatment fairly well with no concerning adverse effects. He is currently on observation and feeling fine with no concerning complaints. He had repeat CT scan of the chest performed recently.  The final report is still pending but I personally and independently reviewed the scan images in comparison to the previous scans 3 months ago and I did not see any concerning changes.  I will wait for the final report for confirmation. I recommended for the patient to continue on observation with repeat CT scan of the chest in 4 months. He was advised to call immediately if he has any other concerning symptoms in the interval. The patient voices understanding of current disease status and treatment options and is in agreement with the current care plan.  All questions were answered. The patient knows to call the clinic with any problems, questions or concerns. We can certainly see the patient much sooner if necessary.  Disclaimer: This note was dictated with voice recognition software. Similar sounding words can inadvertently be transcribed and may not be corrected upon review.

## 2023-08-03 ENCOUNTER — Other Ambulatory Visit: Payer: Self-pay | Admitting: Physician Assistant

## 2023-08-03 DIAGNOSIS — E039 Hypothyroidism, unspecified: Secondary | ICD-10-CM

## 2023-08-04 ENCOUNTER — Telehealth: Payer: Self-pay | Admitting: Physician Assistant

## 2023-08-04 NOTE — Telephone Encounter (Signed)
I called the patient to clarify the dose of his synthroid that he has been taking and to make sure he was taking it as prescribed. His TSH was elevated on 07/10/23. He states he has been compliant. Therefore, I will refill this and I will increase the dose. He is aware

## 2023-11-06 ENCOUNTER — Other Ambulatory Visit: Payer: Self-pay | Admitting: Physician Assistant

## 2023-11-06 DIAGNOSIS — E039 Hypothyroidism, unspecified: Secondary | ICD-10-CM

## 2023-11-07 ENCOUNTER — Ambulatory Visit (HOSPITAL_COMMUNITY)
Admission: RE | Admit: 2023-11-07 | Discharge: 2023-11-07 | Disposition: A | Discharge status: Home / Self Care | Payer: PPO | Source: Ambulatory Visit | Attending: Internal Medicine | Admitting: Internal Medicine

## 2023-11-07 ENCOUNTER — Other Ambulatory Visit: Payer: Self-pay | Admitting: Physician Assistant

## 2023-11-07 ENCOUNTER — Inpatient Hospital Stay: Payer: PPO | Attending: Internal Medicine

## 2023-11-07 ENCOUNTER — Other Ambulatory Visit: Payer: PPO

## 2023-11-07 DIAGNOSIS — I2510 Atherosclerotic heart disease of native coronary artery without angina pectoris: Secondary | ICD-10-CM | POA: Diagnosis not present

## 2023-11-07 DIAGNOSIS — C3412 Malignant neoplasm of upper lobe, left bronchus or lung: Secondary | ICD-10-CM | POA: Insufficient documentation

## 2023-11-07 DIAGNOSIS — E039 Hypothyroidism, unspecified: Secondary | ICD-10-CM

## 2023-11-07 DIAGNOSIS — R0602 Shortness of breath: Secondary | ICD-10-CM | POA: Insufficient documentation

## 2023-11-07 DIAGNOSIS — Z9226 Personal history of immune checkpoint inhibitor therapy: Secondary | ICD-10-CM | POA: Insufficient documentation

## 2023-11-07 DIAGNOSIS — M79604 Pain in right leg: Secondary | ICD-10-CM | POA: Diagnosis not present

## 2023-11-07 DIAGNOSIS — Z923 Personal history of irradiation: Secondary | ICD-10-CM | POA: Insufficient documentation

## 2023-11-07 DIAGNOSIS — Z79899 Other long term (current) drug therapy: Secondary | ICD-10-CM | POA: Diagnosis not present

## 2023-11-07 DIAGNOSIS — M47814 Spondylosis without myelopathy or radiculopathy, thoracic region: Secondary | ICD-10-CM | POA: Insufficient documentation

## 2023-11-07 DIAGNOSIS — C3490 Malignant neoplasm of unspecified part of unspecified bronchus or lung: Secondary | ICD-10-CM

## 2023-11-07 DIAGNOSIS — Z860101 Personal history of adenomatous and serrated colon polyps: Secondary | ICD-10-CM | POA: Insufficient documentation

## 2023-11-07 DIAGNOSIS — I700 Atherosclerosis of aorta: Secondary | ICD-10-CM | POA: Diagnosis not present

## 2023-11-07 DIAGNOSIS — R635 Abnormal weight gain: Secondary | ICD-10-CM | POA: Diagnosis not present

## 2023-11-07 DIAGNOSIS — R0609 Other forms of dyspnea: Secondary | ICD-10-CM | POA: Diagnosis not present

## 2023-11-07 LAB — CBC WITH DIFFERENTIAL (CANCER CENTER ONLY)
Abs Immature Granulocytes: 0.01 10*3/uL (ref 0.00–0.07)
Basophils Absolute: 0 10*3/uL (ref 0.0–0.1)
Basophils Relative: 1 %
Eosinophils Absolute: 0.1 10*3/uL (ref 0.0–0.5)
Eosinophils Relative: 2 %
HCT: 41.1 % (ref 39.0–52.0)
Hemoglobin: 14 g/dL (ref 13.0–17.0)
Immature Granulocytes: 0 %
Lymphocytes Relative: 28 %
Lymphs Abs: 1.6 10*3/uL (ref 0.7–4.0)
MCH: 32.3 pg (ref 26.0–34.0)
MCHC: 34.1 g/dL (ref 30.0–36.0)
MCV: 94.9 fL (ref 80.0–100.0)
Monocytes Absolute: 0.7 10*3/uL (ref 0.1–1.0)
Monocytes Relative: 12 %
Neutro Abs: 3.4 10*3/uL (ref 1.7–7.7)
Neutrophils Relative %: 57 %
Platelet Count: 147 10*3/uL — ABNORMAL LOW (ref 150–400)
RBC: 4.33 MIL/uL (ref 4.22–5.81)
RDW: 12.6 % (ref 11.5–15.5)
WBC Count: 5.9 10*3/uL (ref 4.0–10.5)
nRBC: 0 % (ref 0.0–0.2)

## 2023-11-07 LAB — CMP (CANCER CENTER ONLY)
ALT: 12 U/L (ref 0–44)
AST: 11 U/L — ABNORMAL LOW (ref 15–41)
Albumin: 4.2 g/dL (ref 3.5–5.0)
Alkaline Phosphatase: 57 U/L (ref 38–126)
Anion gap: 5 (ref 5–15)
BUN: 14 mg/dL (ref 8–23)
CO2: 29 mmol/L (ref 22–32)
Calcium: 9.2 mg/dL (ref 8.9–10.3)
Chloride: 106 mmol/L (ref 98–111)
Creatinine: 1.87 mg/dL — ABNORMAL HIGH (ref 0.61–1.24)
GFR, Estimated: 37 mL/min — ABNORMAL LOW (ref >60)
Glucose, Bld: 90 mg/dL (ref 70–99)
Potassium: 4.3 mmol/L (ref 3.5–5.1)
Sodium: 140 mmol/L (ref 135–145)
Total Bilirubin: 0.6 mg/dL (ref <1.2)
Total Protein: 6.9 g/dL (ref 6.5–8.1)

## 2023-11-07 LAB — TSH: TSH: 5.876 u[IU]/mL — ABNORMAL HIGH (ref 0.350–4.500)

## 2023-11-07 LAB — T4, FREE: Free T4: 1.11 ng/dL (ref 0.61–1.12)

## 2023-11-14 ENCOUNTER — Inpatient Hospital Stay: Payer: PPO | Admitting: Internal Medicine

## 2023-11-14 VITALS — BP 120/55 | HR 44 | Temp 98.0°F | Resp 17 | Wt 182.7 lb

## 2023-11-14 DIAGNOSIS — C3490 Malignant neoplasm of unspecified part of unspecified bronchus or lung: Secondary | ICD-10-CM

## 2023-11-14 DIAGNOSIS — C3412 Malignant neoplasm of upper lobe, left bronchus or lung: Secondary | ICD-10-CM | POA: Diagnosis not present

## 2023-11-14 NOTE — Progress Notes (Signed)
[facility] [phone]   [phone]  OFFICE PROGRESS NOTE  Gwenlyn Found, MD [address] [place] [address]  DIAGNOSIS: Recurrent non-small cell lung cancer initially diagnosed as stage IA (T1b, N0, M0) non-small cell lung cancer, adenocarcinoma presented with left upper lobe lung nodule diagnosed in January 2020.  The patient has evidence for disease recurrence in AP window lymphadenopathy in December 2022.   There was insufficient material for the molecular studies by [facility].   PRIOR THERAPY:  1) Status post left lingulectomy with lymph node dissection under the care of Dr. Tyrone Sage on January 01, 2019. 2) Concurrent chemoradiation with carboplatin for an AUC of 2 and paclitaxel 45 mg per metered squared weekly.  First dose on 01/24/2022.  Status post 7 cycles. Last dose was given March 14, 2022. 3) Consolidation treatment with immunotherapy with Imfinzi 1500 Mg IV every 4 weeks.  First dose Apr 12, 2022.  Status post 13 cycles.   CURRENT THERAPY: Observation  INTERVAL HISTORY: Jack Taylor 77 y.o. male returns to the clinic today for 57-month follow-up visit accompanied by his wife.Discussed the use of AI scribe software for clinical note transcription with the patient, who gave verbal consent to proceed.  History of Present Illness   The patient, a 77 year old with a history of cancer, underwent chemoradiation with weekly carboplatin and paclitaxel in December 2022. Following a positive response, he was transitioned to immunotherapy with Imfinzi for a year, which was completed in April 2024.  Since the last visit four months ago, the patient reports feeling good overall. However, he has developed a new complaint of burning pain in the right leg, particularly when sitting. This symptom is suggestive of sciatica, possibly due to degenerative disc disease.  The patient also reports experiencing shortness of breath upon  exertion. There is no associated cough or hemoptysis. He denies any gastrointestinal symptoms such as nausea, vomiting, or diarrhea. The patient's weight gain is noted, which could be contributing to the dyspnea on exertion.  The most recent chest scan shows no evidence of cancer progression or spread.       MEDICAL HISTORY: Past Medical History:  Diagnosis Date   Adenomatous colon polyp    Anxiety    Blood transfusion without reported diagnosis    Cancer (HCC)    Lung; 11/2018   CKD (chronic kidney disease)    stage III 11/09/18   COPD (chronic obstructive pulmonary disease) (HCC)    Gastric ulcer    GERD (gastroesophageal reflux disease)    High cholesterol    History of radiation therapy    Left Lung- 02/17/22-03/18/22-Dr. Antony Blackbird   Hypertension    Pre-diabetes    A1c 6.2% 11/09/18    ALLERGIES:  has No Known Allergies.  MEDICATIONS:  Current Outpatient Medications  Medication Sig Dispense Refill   albuterol (PROVENTIL HFA;VENTOLIN HFA) 108 (90 Base) MCG/ACT inhaler Inhale 2 puffs into the lungs every 6 (six) hours as needed for wheezing or shortness of breath. Reported on 06/02/2016     amLODipine (NORVASC) 10 MG tablet Take 10 mg by mouth daily.      ANORO ELLIPTA 62.5-25 MCG/ACT AEPB Inhale 1 puff into the lungs daily.     esomeprazole (NEXIUM) 40 MG capsule Take 40 mg by mouth daily.      levothyroxine (SYNTHROID) 112 MCG tablet TAKE 1 TABLET BY MOUTH ONCE DAILY BEFORE BREAKFAST 30 tablet 0   losartan (COZAAR) 50 MG  tablet Take 50 mg by mouth daily.      OVER THE COUNTER MEDICATION Take 1 tablet by mouth daily as needed (for constipation).     prochlorperazine (COMPAZINE) 10 MG tablet TAKE 1 TABLET BY MOUTH EVERY 6 HOURS AS NEEDED 30 tablet 0   rosuvastatin (CRESTOR) 10 MG tablet Take 10 mg by mouth daily.     sucralfate (CARAFATE) 1 g tablet Take 1 tablet (1 g total) by mouth 4 (four) times daily -  with meals and at bedtime. Crush and dissolve in 10 mL of warm  water prior to swallowing 120 tablet 1   traZODone (DESYREL) 50 MG tablet Take 50 mg by mouth at bedtime.     No current facility-administered medications for this visit.    SURGICAL HISTORY:  Past Surgical History:  Procedure Laterality Date   BRONCHIAL NEEDLE ASPIRATION BIOPSY  01/10/2022   Procedure: BRONCHIAL NEEDLE ASPIRATION BIOPSIES;  Surgeon: Leslye Peer, MD;  Location: [facility] ENDOSCOPY;  Service: Cardiopulmonary;;   COLONOSCOPY  2008   ESOPHAGOGASTRODUODENOSCOPY  11/23/2011   Procedure: ESOPHAGOGASTRODUODENOSCOPY (EGD);  Surgeon: Rob Bunting, MD;  Location: [facility] ENDOSCOPY;  Service: Endoscopy;  Laterality: N/A;   INTERCOSTAL NERVE BLOCK Left 01/01/2019   Procedure: INTERCOSTAL NERVE BLOCK;  Surgeon: Delight Ovens, MD;  Location: [facility] OR;  Service: Thoracic;  Laterality: Left;   LUNG BIOPSY  12/18/2018   Procedure: LUNG BIOPSY - of Left Upper Lobe Biopsy of #7 Node Biopsy of #10L Node;  Surgeon: Delight Ovens, MD;  Location: [facility] OR;  Service: Thoracic;;   LYMPH NODE DISSECTION Left 01/01/2019   Procedure: LYMPH NODE DISSECTION;  Surgeon: Delight Ovens, MD;  Location[facility] OR;  Service: Thoracic;  Laterality: Left;   UPPER GASTROINTESTINAL ENDOSCOPY  11/23/11   VIDEO ASSISTED THORACOSCOPY (VATS)/WEDGE RESECTION Left 01/01/2019   Procedure: VIDEO ASSISTED THORACOSCOPY (VATS)/ LEFT LINGULECTOMY;  Surgeon: Delight Ovens, MD;  Location: [facility] OR;  Service: Thoracic;  Laterality: Left;   VIDEO BRONCHOSCOPY WITH ENDOBRONCHIAL NAVIGATION N/A 12/18/2018   Procedure: VIDEO BRONCHOSCOPY WITH ENDOBRONCHIAL NAVIGATION with Placement of Fiducial Markers x 3;  Surgeon: Delight Ovens, MD;  Location: [facility] OR;  Service: Thoracic;  Laterality: N/A;   VIDEO BRONCHOSCOPY WITH ENDOBRONCHIAL ULTRASOUND N/A 12/18/2018   Procedure: VIDEO BRONCHOSCOPY WITH ENDOBRONCHIAL ULTRASOUND;  Surgeon: Delight Ovens, MD;  Location: [facility] OR;  Service: Thoracic;  Laterality: N/A;   VIDEO BRONCHOSCOPY WITH  ENDOBRONCHIAL ULTRASOUND Left 01/10/2022   Procedure: VIDEO BRONCHOSCOPY WITH ENDOBRONCHIAL ULTRASOUND;  Surgeon: Leslye Peer, MD;  Location: [facility] ENDOSCOPY;  Service: Cardiopulmonary;  Laterality: Left;    REVIEW OF SYSTEMS:  A comprehensive review of systems was negative except for: Respiratory: positive for cough   PHYSICAL EXAMINATION: General appearance: alert, cooperative, fatigued, and no distress Head: Normocephalic, without obvious abnormality, atraumatic Neck: no adenopathy, no JVD, supple, symmetrical, trachea midline, and thyroid not enlarged, symmetric, no tenderness/mass/nodules Lymph nodes: Cervical, supraclavicular, and axillary nodes normal. Resp: clear to auscultation bilaterally Back: symmetric, no curvature. ROM normal. No CVA tenderness. Cardio: regular rate and rhythm, S1, S2 normal, no murmur, click, rub or gallop GI: soft, non-tender; bowel sounds normal; no masses,  no organomegaly Extremities: extremities normal, atraumatic, no cyanosis or edema  ECOG PERFORMANCE STATUS: 1 - Symptomatic but completely ambulatory  Blood pressure (!) 120/55, pulse (!) 44, temperature 98 F (36.7 C), temperature source Temporal, resp. rate 17, weight 182 lb 11.2 oz (82.9 kg), SpO2 94%.  LABORATORY DATA: Lab Results  Component Value Date  WBC 5.9 11/07/2023   HGB 14.0 11/07/2023   HCT 41.1 11/07/2023   MCV 94.9 11/07/2023   PLT 147 (L) 11/07/2023      Chemistry      Component Value Date/Time   NA 140 11/07/2023 1409   K 4.3 11/07/2023 1409   CL 106 11/07/2023 1409   CO2 29 11/07/2023 1409   BUN 14 11/07/2023 1409   CREATININE 1.87 (H) 11/07/2023 1409      Component Value Date/Time   CALCIUM 9.2 11/07/2023 1409   ALKPHOS 57 11/07/2023 1409   AST 11 (L) 11/07/2023 1409   ALT 12 11/07/2023 1409   BILITOT 0.6 11/07/2023 1409       RADIOGRAPHIC STUDIES: CT Chest Wo Contrast  Result Date: 11/13/2023 CLINICAL DATA:  Non-small cell lung cancer (NSCLC), restaging. *  Tracking Code: BO * EXAM: CT CHEST WITHOUT CONTRAST TECHNIQUE: Multidetector CT imaging of the chest was performed following the standard protocol without IV contrast. RADIATION DOSE REDUCTION: This exam was performed according to the departmental dose-optimization program which includes automated exposure control, adjustment of the mA and/or kV according to patient size and/or use of iterative reconstruction technique. COMPARISON:  07/10/2023 chest CT FINDINGS: Cardiovascular: Normal heart size. No significant pericardial effusion/thickening. Three-vessel coronary atherosclerosis. Atherosclerotic nonaneurysmal thoracic aorta. Normal caliber pulmonary arteries. Mediastinum/Nodes: No significant thyroid nodules. Unremarkable esophagus. No pathologically enlarged axillary, mediastinal or hilar lymph nodes, noting limited sensitivity for the detection of hilar adenopathy on this noncontrast study. Lungs/Pleura: No pneumothorax. No pleural effusion. Stable postsurgical changes from posterior left upper lobe wedge resection. No acute consolidative airspace disease or lung masses. Solid 0.5 cm posterior basilar left lower lobe pulmonary nodule (series 5/image 128) and solid 0.4 cm posterior right upper lobe nodule (series 5/image 54), both stable. No new significant pulmonary nodules. Upper abdomen: No acute abnormality. Musculoskeletal: No aggressive appearing focal osseous lesions. Moderate thoracic spondylosis. IMPRESSION: 1. Stable postsurgical changes from posterior left upper lobe wedge resection. No evidence of local tumor recurrence. 2. No evidence of metastatic disease in the chest. 3. Three-vessel coronary atherosclerosis. 4.  Aortic Atherosclerosis (ICD10-I70.0). Electronically Signed   By: Delbert Phenix M.D.   On: 11/13/2023 16:53    ASSESSMENT AND PLAN: This is a very pleasant 77 years old white male diagnosed with recurrent non-small cell lung cancer that was initially diagnosed as stage IA (T1b, N0, M0)  non-small cell lung cancer, adenocarcinoma presented with left upper lobe lung nodule in January 2020.  The patient has evidence for disease recurrence in AP window lymphadenopathy in December 2022.  He is status post left lingulectomy with lymph node dissection under the care of Dr. Tyrone Sage on January 01, 2019.  This was biopsy-proven to be recurrent adenocarcinoma of the lung. The patient underwent a course of concurrent chemoradiation with weekly carboplatin for AUC of 2 and paclitaxel 45 Mg/M2 status post 7 cycles.  Last dose was given March 14, 2022.  The patient has partial response to this treatment. The patient underwent consolidation treatment with immunotherapy with Imfinzi 1500 Mg IV every 4 weeks status post 13 cycles.   The patient has been on observation and he is doing fine with no concerning complaints. He had repeat CT scan of the chest performed recently.  I personally and independently reviewed the scan and discussed the result with the patient and his wife.  His scan showed no evidence of local tumor recurrence or metastatic disease.    Non-Small Cell Lung Cancer (NSCLC) 77 year old with NSCLC treated  with chemoradiation (carboplatin and paclitaxel) followed by immunotherapy [facility]) since December 2022. Recent chest scan shows no evidence of cancer growth or metastasis. Reports feeling well overall with no chest pain, coughing, or gastrointestinal symptoms. Shortness of breath on exertion, likely exacerbated by weight gain. - Continue current management - Schedule follow-up in four months - Order chest scan and lab work one week before next visit  Shortness of Breath on Exertion Shortness of breath on exertion, potentially exacerbated by recent weight gain. - Monitor weight and encourage weight management strategies  Degenerative Disc Disease with Sciatica Burning pain in the right leg, exacerbated by sitting, consistent with sciatica. Likely related to degenerative disc  disease. Pain improves with walking. - Refer to orthopedic surgeon for evaluation and possible injection therapy.   The patient was advised to call immediately if he has any other concerning symptoms in the interval. The patient voices understanding of current disease status and treatment options and is in agreement with the current care plan.  All questions were answered. The patient knows to call the clinic with any problems, questions or concerns. We can certainly see the patient much sooner if necessary.  Disclaimer: This note was dictated with voice recognition software. Similar sounding words can inadvertently be transcribed and may not be corrected upon review.

## 2023-12-07 ENCOUNTER — Other Ambulatory Visit: Payer: Self-pay | Admitting: Physician Assistant

## 2023-12-07 DIAGNOSIS — E039 Hypothyroidism, unspecified: Secondary | ICD-10-CM

## 2024-02-11 ENCOUNTER — Encounter: Payer: Self-pay | Admitting: Emergency Medicine

## 2024-02-22 ENCOUNTER — Telehealth: Payer: Self-pay | Admitting: Internal Medicine

## 2024-02-22 NOTE — Telephone Encounter (Signed)
 Called to schedule appointments around a CT scan.

## 2024-03-05 ENCOUNTER — Inpatient Hospital Stay: Attending: Internal Medicine

## 2024-03-05 ENCOUNTER — Ambulatory Visit (HOSPITAL_COMMUNITY)
Admission: RE | Admit: 2024-03-05 | Discharge: 2024-03-05 | Disposition: A | Discharge status: Home / Self Care | Source: Ambulatory Visit | Attending: Internal Medicine | Admitting: Internal Medicine

## 2024-03-05 DIAGNOSIS — C3490 Malignant neoplasm of unspecified part of unspecified bronchus or lung: Secondary | ICD-10-CM

## 2024-03-05 DIAGNOSIS — Z79899 Other long term (current) drug therapy: Secondary | ICD-10-CM | POA: Insufficient documentation

## 2024-03-05 DIAGNOSIS — Z860101 Personal history of adenomatous and serrated colon polyps: Secondary | ICD-10-CM | POA: Insufficient documentation

## 2024-03-05 DIAGNOSIS — Z9226 Personal history of immune checkpoint inhibitor therapy: Secondary | ICD-10-CM | POA: Insufficient documentation

## 2024-03-05 DIAGNOSIS — J302 Other seasonal allergic rhinitis: Secondary | ICD-10-CM | POA: Insufficient documentation

## 2024-03-05 DIAGNOSIS — I700 Atherosclerosis of aorta: Secondary | ICD-10-CM | POA: Insufficient documentation

## 2024-03-05 DIAGNOSIS — M47814 Spondylosis without myelopathy or radiculopathy, thoracic region: Secondary | ICD-10-CM | POA: Insufficient documentation

## 2024-03-05 DIAGNOSIS — C3412 Malignant neoplasm of upper lobe, left bronchus or lung: Secondary | ICD-10-CM | POA: Insufficient documentation

## 2024-03-05 DIAGNOSIS — K449 Diaphragmatic hernia without obstruction or gangrene: Secondary | ICD-10-CM | POA: Insufficient documentation

## 2024-03-05 DIAGNOSIS — J439 Emphysema, unspecified: Secondary | ICD-10-CM | POA: Insufficient documentation

## 2024-03-05 DIAGNOSIS — Z923 Personal history of irradiation: Secondary | ICD-10-CM | POA: Insufficient documentation

## 2024-03-05 LAB — CBC WITH DIFFERENTIAL (CANCER CENTER ONLY)
Abs Immature Granulocytes: 0.09 10*3/uL — ABNORMAL HIGH (ref 0.00–0.07)
Basophils Absolute: 0.1 10*3/uL (ref 0.0–0.1)
Basophils Relative: 1 %
Eosinophils Absolute: 0.2 10*3/uL (ref 0.0–0.5)
Eosinophils Relative: 3 %
HCT: 43.8 % (ref 39.0–52.0)
Hemoglobin: 15.1 g/dL (ref 13.0–17.0)
Immature Granulocytes: 1 %
Lymphocytes Relative: 21 %
Lymphs Abs: 1.3 10*3/uL (ref 0.7–4.0)
MCH: 31.9 pg (ref 26.0–34.0)
MCHC: 34.5 g/dL (ref 30.0–36.0)
MCV: 92.6 fL (ref 80.0–100.0)
Monocytes Absolute: 0.8 10*3/uL (ref 0.1–1.0)
Monocytes Relative: 13 %
Neutro Abs: 3.9 10*3/uL (ref 1.7–7.7)
Neutrophils Relative %: 61 %
Platelet Count: 143 10*3/uL — ABNORMAL LOW (ref 150–400)
RBC: 4.73 MIL/uL (ref 4.22–5.81)
RDW: 13 % (ref 11.5–15.5)
WBC Count: 6.4 10*3/uL (ref 4.0–10.5)
nRBC: 0 % (ref 0.0–0.2)

## 2024-03-05 LAB — CMP (CANCER CENTER ONLY)
ALT: 16 U/L (ref 0–44)
AST: 14 U/L — ABNORMAL LOW (ref 15–41)
Albumin: 4.2 g/dL (ref 3.5–5.0)
Alkaline Phosphatase: 64 U/L (ref 38–126)
Anion gap: 6 (ref 5–15)
BUN: 15 mg/dL (ref 8–23)
CO2: 29 mmol/L (ref 22–32)
Calcium: 9.4 mg/dL (ref 8.9–10.3)
Chloride: 103 mmol/L (ref 98–111)
Creatinine: 2.06 mg/dL — ABNORMAL HIGH (ref 0.61–1.24)
GFR, Estimated: 32 mL/min — ABNORMAL LOW (ref >60)
Glucose, Bld: 104 mg/dL — ABNORMAL HIGH (ref 70–99)
Potassium: 4.5 mmol/L (ref 3.5–5.1)
Sodium: 138 mmol/L (ref 135–145)
Total Bilirubin: 0.9 mg/dL (ref 0.0–1.2)
Total Protein: 7.5 g/dL (ref 6.5–8.1)

## 2024-03-05 MED ORDER — IOHEXOL 300 MG/ML  SOLN
60.0000 mL | Freq: Once | INTRAMUSCULAR | Status: AC | PRN
  Administered 2024-03-05: 60 mL via INTRAVENOUS

## 2024-03-05 MED ORDER — SODIUM CHLORIDE (PF) 0.9 % IJ SOLN
INTRAMUSCULAR | Status: AC
  Filled 2024-03-05: qty 50

## 2024-03-10 ENCOUNTER — Other Ambulatory Visit: Payer: Self-pay | Admitting: Physician Assistant

## 2024-03-10 DIAGNOSIS — E039 Hypothyroidism, unspecified: Secondary | ICD-10-CM

## 2024-03-12 ENCOUNTER — Telehealth: Payer: Self-pay | Admitting: Internal Medicine

## 2024-03-12 ENCOUNTER — Inpatient Hospital Stay: Admitting: Internal Medicine

## 2024-03-12 VITALS — BP 120/63 | HR 50 | Temp 98.0°F | Resp 17 | Ht 68.0 in | Wt 180.8 lb

## 2024-03-12 DIAGNOSIS — C3412 Malignant neoplasm of upper lobe, left bronchus or lung: Secondary | ICD-10-CM | POA: Diagnosis present

## 2024-03-12 DIAGNOSIS — M47814 Spondylosis without myelopathy or radiculopathy, thoracic region: Secondary | ICD-10-CM | POA: Diagnosis not present

## 2024-03-12 DIAGNOSIS — J302 Other seasonal allergic rhinitis: Secondary | ICD-10-CM | POA: Diagnosis not present

## 2024-03-12 DIAGNOSIS — C3490 Malignant neoplasm of unspecified part of unspecified bronchus or lung: Secondary | ICD-10-CM | POA: Diagnosis not present

## 2024-03-12 DIAGNOSIS — Z923 Personal history of irradiation: Secondary | ICD-10-CM | POA: Diagnosis not present

## 2024-03-12 DIAGNOSIS — Z79899 Other long term (current) drug therapy: Secondary | ICD-10-CM | POA: Diagnosis not present

## 2024-03-12 DIAGNOSIS — I700 Atherosclerosis of aorta: Secondary | ICD-10-CM | POA: Diagnosis not present

## 2024-03-12 DIAGNOSIS — Z9226 Personal history of immune checkpoint inhibitor therapy: Secondary | ICD-10-CM | POA: Diagnosis not present

## 2024-03-12 DIAGNOSIS — Z860101 Personal history of adenomatous and serrated colon polyps: Secondary | ICD-10-CM | POA: Diagnosis not present

## 2024-03-12 DIAGNOSIS — K449 Diaphragmatic hernia without obstruction or gangrene: Secondary | ICD-10-CM | POA: Diagnosis not present

## 2024-03-12 DIAGNOSIS — J439 Emphysema, unspecified: Secondary | ICD-10-CM | POA: Diagnosis not present

## 2024-03-12 NOTE — Telephone Encounter (Signed)
 Scheduled appointments around a CT scan expected date. Left the patient a voicemail with appointment details and he will also be mailed a reminder.

## 2024-03-12 NOTE — Progress Notes (Signed)
 [name] [phone]   [phone]  OFFICE PROGRESS NOTE  Gwenlyn Found, MD [address] [place] [address]  DIAGNOSIS: Recurrent non-small cell lung cancer initially diagnosed as stage IA (T1b, N0, M0) non-small cell lung cancer, adenocarcinoma presented with left upper lobe lung nodule diagnosed in January 2020.  The patient has evidence for disease recurrence in AP window lymphadenopathy in December 2022.   There was insufficient material for the molecular studies by [facility].   PRIOR THERAPY:  1) Status post left lingulectomy with lymph node dissection under the care of Dr. Tyrone Sage on January 01, 2019. 2) Concurrent chemoradiation with carboplatin for an AUC of 2 and paclitaxel 45 mg per metered squared weekly.  First dose on 01/24/2022.  Status post 7 cycles. Last dose was given March 14, 2022. 3) Consolidation treatment with immunotherapy with Imfinzi 1500 Mg IV every 4 weeks.  First dose Apr 12, 2022.  Status post 13 cycles.   CURRENT THERAPY: Observation  INTERVAL HISTORY: Jack Taylor 78 y.o. male returns to the clinic today for 34-month follow-up visit accompanied by his wife.Discussed the use of AI scribe software for clinical note transcription with the patient, who gave verbal consent to proceed.  History of Present Illness   Jack Taylor is a 78 year old male with recurrent non-small cell lung cancer who presents for evaluation and repeat CT scan of the chest. He is accompanied by his wife.  He has a history of recurrent non-small cell lung cancer, previously treated with concurrent chemoradiation using weekly carboplatin and paclitaxel, followed by one year of consolidation immunotherapy with Amphenzy, completed in April 2024. Since then, he has been under observation.  He feels 'pretty good' since his last visit but experiences shortness of breath attributed to pollen exposure. He reports soreness in his  chest 'from time to time,' particularly when coughing heavily. No new chest pain, new cough, or hemoptysis. He does not use supplemental oxygen at home.  His heart rate has been consistently low, ranging between 40 to 50 beats per minute, which has not been problematic until this year. No new symptoms related to bradycardia.  His wife recently retired and mentions that he does not move without his wife's presence due to his hearing problems.        MEDICAL HISTORY: Past Medical History:  Diagnosis Date   Adenomatous colon polyp    Anxiety    Blood transfusion without reported diagnosis    Cancer (HCC)    Lung; 11/2018   CKD (chronic kidney disease)    stage III 11/09/18   COPD (chronic obstructive pulmonary disease) (HCC)    Gastric ulcer    GERD (gastroesophageal reflux disease)    High cholesterol    History of radiation therapy    Left Lung- 02/17/22-03/18/22-Dr. Antony Blackbird   Hypertension    Pre-diabetes    A1c 6.2% 11/09/18    ALLERGIES:  has no known allergies.  MEDICATIONS:  Current Outpatient Medications  Medication Sig Dispense Refill   albuterol (PROVENTIL HFA;VENTOLIN HFA) 108 (90 Base) MCG/ACT inhaler Inhale 2 puffs into the lungs every 6 (six) hours as needed for wheezing or shortness of breath. Reported on 06/02/2016     amLODipine (NORVASC) 10 MG tablet Take 10 mg by mouth daily.      ANORO ELLIPTA 62.5-25 MCG/ACT AEPB Inhale 1 puff into the lungs daily.     esomeprazole (NEXIUM) 40 MG capsule Take  40 mg by mouth daily.      levothyroxine (SYNTHROID) 112 MCG tablet TAKE 1 TABLET BY MOUTH ONCE DAILY BEFORE BREAKFAST 30 tablet 0   losartan (COZAAR) 50 MG tablet Take 50 mg by mouth daily.      OVER THE COUNTER MEDICATION Take 1 tablet by mouth daily as needed (for constipation).     prochlorperazine (COMPAZINE) 10 MG tablet TAKE 1 TABLET BY MOUTH EVERY 6 HOURS AS NEEDED 30 tablet 0   rosuvastatin (CRESTOR) 10 MG tablet Take 10 mg by mouth daily.     sucralfate  (CARAFATE) 1 g tablet Take 1 tablet (1 g total) by mouth 4 (four) times daily -  with meals and at bedtime. Crush and dissolve in 10 mL of warm water prior to swallowing 120 tablet 1   traZODone (DESYREL) 50 MG tablet Take 50 mg by mouth at bedtime.     No current facility-administered medications for this visit.    SURGICAL HISTORY:  Past Surgical History:  Procedure Laterality Date   BRONCHIAL NEEDLE ASPIRATION BIOPSY  01/10/2022   Procedure: BRONCHIAL NEEDLE ASPIRATION BIOPSIES;  Surgeon: Leslye Peer, MD;  Location: [facility] ENDOSCOPY;  Service: Cardiopulmonary;;   COLONOSCOPY  2008   ESOPHAGOGASTRODUODENOSCOPY  11/23/2011   Procedure: ESOPHAGOGASTRODUODENOSCOPY (EGD);  Surgeon: Rob Bunting, MD;  Location: [facility] ENDOSCOPY;  Service: Endoscopy;  Laterality: N/A;   INTERCOSTAL NERVE BLOCK Left 01/01/2019   Procedure: INTERCOSTAL NERVE BLOCK;  Surgeon: Delight Ovens, MD;  Location: [facility] OR;  Service: Thoracic;  Laterality: Left;   LUNG BIOPSY  12/18/2018   Procedure: LUNG BIOPSY - of Left Upper Lobe Biopsy of #7 Node Biopsy of #10L Node;  Surgeon: Delight Ovens, MD;  Location: [facility] OR;  Service: Thoracic;;   LYMPH NODE DISSECTION Left 01/01/2019   Procedure: LYMPH NODE DISSECTION;  Surgeon: Delight Ovens, MD;  Location: [facility] OR;  Service: Thoracic;  Laterality: Left;   UPPER GASTROINTESTINAL ENDOSCOPY  11/23/11   VIDEO ASSISTED THORACOSCOPY (VATS)/WEDGE RESECTION Left 01/01/2019   Procedure: VIDEO ASSISTED THORACOSCOPY (VATS)/ LEFT LINGULECTOMY;  Surgeon: Delight Ovens, MD;  Location: [facility] OR;  Service: Thoracic;  Laterality: Left;   VIDEO BRONCHOSCOPY WITH ENDOBRONCHIAL NAVIGATION N/A 12/18/2018   Procedure: VIDEO BRONCHOSCOPY WITH ENDOBRONCHIAL NAVIGATION with Placement of Fiducial Markers x 3;  Surgeon: Delight Ovens, MD;  Location: [facility] OR;  Service: Thoracic;  Laterality: N/A;   VIDEO BRONCHOSCOPY WITH ENDOBRONCHIAL ULTRASOUND N/A 12/18/2018   Procedure: VIDEO BRONCHOSCOPY WITH  ENDOBRONCHIAL ULTRASOUND;  Surgeon: Delight Ovens, MD;  Location: [facility] OR;  Service: Thoracic;  Laterality: N/A;   VIDEO BRONCHOSCOPY WITH ENDOBRONCHIAL ULTRASOUND Left 01/10/2022   Procedure: VIDEO BRONCHOSCOPY WITH ENDOBRONCHIAL ULTRASOUND;  Surgeon: Leslye Peer, MD;  Location: [facility] ENDOSCOPY;  Service: Cardiopulmonary;  Laterality: Left;    REVIEW OF SYSTEMS:  A comprehensive review of systems was negative except for: Respiratory: positive for cough and dyspnea on exertion   PHYSICAL EXAMINATION: General appearance: alert, cooperative, fatigued, and no distress Head: Normocephalic, without obvious abnormality, atraumatic Neck: no adenopathy, no JVD, supple, symmetrical, trachea midline, and thyroid not enlarged, symmetric, no tenderness/mass/nodules Lymph nodes: Cervical, supraclavicular, and axillary nodes normal. Resp: clear to auscultation bilaterally Back: symmetric, no curvature. ROM normal. No CVA tenderness. Cardio: regular rate and rhythm, S1, S2 normal, no murmur, click, rub or gallop GI: soft, non-tender; bowel sounds normal; no masses,  no organomegaly Extremities: extremities normal, atraumatic, no cyanosis or edema  ECOG PERFORMANCE STATUS: 1 - Symptomatic but completely ambulatory  Blood pressure 120/63, pulse (!) 50, temperature 98 F (36.7 C), temperature source Temporal, resp. rate 17, height 5\' 8"  (1.727 m), weight 180 lb 12.8 oz (82 kg), SpO2 95%.  LABORATORY DATA: Lab Results  Component Value Date   WBC 6.4 03/05/2024   HGB 15.1 03/05/2024   HCT 43.8 03/05/2024   MCV 92.6 03/05/2024   PLT 143 (L) 03/05/2024      Chemistry      Component Value Date/Time   NA 138 03/05/2024 1156   K 4.5 03/05/2024 1156   CL 103 03/05/2024 1156   CO2 29 03/05/2024 1156   BUN 15 03/05/2024 1156   CREATININE 2.06 (H) 03/05/2024 1156      Component Value Date/Time   CALCIUM 9.4 03/05/2024 1156   ALKPHOS 64 03/05/2024 1156   AST 14 (L) 03/05/2024 1156   ALT 16  03/05/2024 1156   BILITOT 0.9 03/05/2024 1156       RADIOGRAPHIC STUDIES: CT Chest W Contrast Result Date: 03/11/2024 CLINICAL DATA:  Recurrent non-small cell left lung cancer (NSCLC), restaging. * Tracking Code: BO * EXAM: CT CHEST WITH CONTRAST TECHNIQUE: Multidetector CT imaging of the chest was performed during intravenous contrast administration. RADIATION DOSE REDUCTION: This exam was performed according to the departmental dose-optimization program which includes automated exposure control, adjustment of the mA and/or kV according to patient size and/or use of iterative reconstruction technique. CONTRAST:  60mL OMNIPAQUE IOHEXOL 300 MG/ML  SOLN COMPARISON:  11/07/2023 chest CT. FINDINGS: Cardiovascular: Normal heart size. No significant pericardial effusion/thickening. Three-vessel coronary atherosclerosis. Atherosclerotic nonaneurysmal thoracic aorta. Normal caliber pulmonary arteries. No central pulmonary emboli. Mediastinum/Nodes: No significant thyroid nodules. Unremarkable esophagus. No axillary adenopathy. Mildly enlarged 1.0 cm subcarinal node (series 2/image 73), previously 1.0 cm on 11/07/2023 CT using similar measurement technique, stable. No new pathologically enlarged mediastinal nodes. No hilar adenopathy. Lungs/Pleura: No pneumothorax. No pleural effusion. No acute consolidative airspace disease or lung masses. Posterior basilar left lower lobe 0.5 cm pulmonary nodule (series 5/image 127), stable. Indistinct 0.5 cm posterior right upper lobe nodule in series 5/image 52, stable. Stable postsurgical changes from posterior/inferior left upper lobe wedge resection. No new significant pulmonary nodules. Upper abdomen: Small hiatal hernia. Musculoskeletal: No aggressive appearing focal osseous lesions. Moderate thoracic spondylosis. IMPRESSION: 1. Stable chest CT. No evidence of local tumor recurrence in the left upper lobe status post wedge resection. 2. No evidence of recurrent metastatic  disease in the chest. Stable mildly enlarged subcarinal lymph node, presumably reactive. 3. Three-vessel coronary atherosclerosis. 4. Small hiatal hernia. 5. Aortic Atherosclerosis (ICD10-I70.0) and Emphysema (ICD10-J43.9). Electronically Signed   By: Delbert Phenix M.D.   On: 03/11/2024 18:42    ASSESSMENT AND PLAN: This is a very pleasant 78 years old white male diagnosed with recurrent non-small cell lung cancer that was initially diagnosed as stage IA (T1b, N0, M0) non-small cell lung cancer, adenocarcinoma presented with left upper lobe lung nodule in January 2020.  The patient has evidence for disease recurrence in AP window lymphadenopathy in December 2022.  He is status post left lingulectomy with lymph node dissection under the care of Dr. Tyrone Sage on January 01, 2019.  This was biopsy-proven to be recurrent adenocarcinoma of the lung. The patient underwent a course of concurrent chemoradiation with weekly carboplatin for AUC of 2 and paclitaxel 45 Mg/M2 status post 7 cycles.  Last dose was given March 14, 2022.  The patient has partial response to this treatment. The patient underwent consolidation treatment with immunotherapy with Imfinzi 1500  Mg IV every 4 weeks status post 13 cycles.   The patient has been on observation and he is doing fine with no concerning complaints. He had repeat CT scan of the chest performed recently that showed no concerning findings for disease progression.    Recurrent non-small cell lung cancer Recurrent non-small cell lung cancer, previously treated with concurrent chemoradiation and consolidation immunotherapy. Recent CT scan of the chest shows no evidence of disease progression, indicating that the cancer remains under control. Reports occasional chest soreness, likely related to coughing, but no new significant symptoms such as chest pain, new cough, or hemoptysis.  - Schedule follow-up CT scan in 6 months, ensuring it is performed 7-10 days prior to the next  appointment.  Seasonal allergies Experiences shortness of breath and chest soreness exacerbated by pollen exposure, indicating seasonal allergies. Symptoms are particularly bothersome this year. - Advise monitoring of symptoms and consult primary care physician or pulmonologist if shortness of breath worsens.  Bradycardia Chronic bradycardia with heart rate consistently between 40-50 bpm, well-managed over the years. Reports no new symptoms related to bradycardia, and previous cardiology evaluation found no significant issues.   The patient was advised to call immediately if he has any concerning symptoms in the interval.  The patient voices understanding of current disease status and treatment options and is in agreement with the current care plan.  All questions were answered. The patient knows to call the clinic with any problems, questions or concerns. We can certainly see the patient much sooner if necessary.  Disclaimer: This note was dictated with voice recognition software. Similar sounding words can inadvertently be transcribed and may not be corrected upon review.

## 2024-04-10 ENCOUNTER — Other Ambulatory Visit: Payer: Self-pay | Admitting: Physician Assistant

## 2024-04-10 DIAGNOSIS — E039 Hypothyroidism, unspecified: Secondary | ICD-10-CM

## 2024-05-10 ENCOUNTER — Other Ambulatory Visit: Payer: Self-pay | Admitting: Physician Assistant

## 2024-05-10 DIAGNOSIS — E039 Hypothyroidism, unspecified: Secondary | ICD-10-CM

## 2024-05-11 ENCOUNTER — Other Ambulatory Visit: Payer: Self-pay | Admitting: Physician Assistant

## 2024-05-11 DIAGNOSIS — E039 Hypothyroidism, unspecified: Secondary | ICD-10-CM

## 2024-06-05 ENCOUNTER — Other Ambulatory Visit: Payer: Self-pay | Admitting: Physician Assistant

## 2024-06-05 DIAGNOSIS — E039 Hypothyroidism, unspecified: Secondary | ICD-10-CM

## 2024-09-02 ENCOUNTER — Ambulatory Visit (HOSPITAL_COMMUNITY)
Admission: RE | Admit: 2024-09-02 | Discharge: 2024-09-02 | Disposition: A | Source: Ambulatory Visit | Attending: Internal Medicine | Admitting: Internal Medicine

## 2024-09-02 ENCOUNTER — Inpatient Hospital Stay: Attending: Internal Medicine

## 2024-09-02 DIAGNOSIS — C3490 Malignant neoplasm of unspecified part of unspecified bronchus or lung: Secondary | ICD-10-CM | POA: Diagnosis present

## 2024-09-02 DIAGNOSIS — I2510 Atherosclerotic heart disease of native coronary artery without angina pectoris: Secondary | ICD-10-CM | POA: Diagnosis not present

## 2024-09-02 DIAGNOSIS — K760 Fatty (change of) liver, not elsewhere classified: Secondary | ICD-10-CM | POA: Insufficient documentation

## 2024-09-02 LAB — CBC WITH DIFFERENTIAL (CANCER CENTER ONLY)
Abs Immature Granulocytes: 0.03 K/uL (ref 0.00–0.07)
Basophils Absolute: 0.1 K/uL (ref 0.0–0.1)
Basophils Relative: 1 %
Eosinophils Absolute: 0.2 K/uL (ref 0.0–0.5)
Eosinophils Relative: 3 %
HCT: 40.7 % (ref 39.0–52.0)
Hemoglobin: 14 g/dL (ref 13.0–17.0)
Immature Granulocytes: 1 %
Lymphocytes Relative: 29 %
Lymphs Abs: 1.4 K/uL (ref 0.7–4.0)
MCH: 32 pg (ref 26.0–34.0)
MCHC: 34.4 g/dL (ref 30.0–36.0)
MCV: 92.9 fL (ref 80.0–100.0)
Monocytes Absolute: 0.6 K/uL (ref 0.1–1.0)
Monocytes Relative: 11 %
Neutro Abs: 2.7 K/uL (ref 1.7–7.7)
Neutrophils Relative %: 55 %
Platelet Count: 133 K/uL — ABNORMAL LOW (ref 150–400)
RBC: 4.38 MIL/uL (ref 4.22–5.81)
RDW: 13.2 % (ref 11.5–15.5)
WBC Count: 5 K/uL (ref 4.0–10.5)
nRBC: 0 % (ref 0.0–0.2)

## 2024-09-02 LAB — CMP (CANCER CENTER ONLY)
ALT: 22 U/L (ref 0–44)
AST: 17 U/L (ref 15–41)
Albumin: 4.1 g/dL (ref 3.5–5.0)
Alkaline Phosphatase: 60 U/L (ref 38–126)
Anion gap: 6 (ref 5–15)
BUN: 17 mg/dL (ref 8–23)
CO2: 28 mmol/L (ref 22–32)
Calcium: 8.8 mg/dL — ABNORMAL LOW (ref 8.9–10.3)
Chloride: 105 mmol/L (ref 98–111)
Creatinine: 2.24 mg/dL — ABNORMAL HIGH (ref 0.61–1.24)
GFR, Estimated: 29 mL/min — ABNORMAL LOW (ref >60)
Glucose, Bld: 110 mg/dL — ABNORMAL HIGH (ref 70–99)
Potassium: 4.3 mmol/L (ref 3.5–5.1)
Sodium: 139 mmol/L (ref 135–145)
Total Bilirubin: 0.5 mg/dL (ref 0.0–1.2)
Total Protein: 7 g/dL (ref 6.5–8.1)

## 2024-09-11 ENCOUNTER — Telehealth: Payer: Self-pay | Admitting: Internal Medicine

## 2024-09-11 ENCOUNTER — Inpatient Hospital Stay: Attending: Internal Medicine | Admitting: Internal Medicine

## 2024-09-11 VITALS — BP 120/64 | HR 66 | Temp 97.3°F | Resp 17 | Ht 68.0 in | Wt 196.3 lb

## 2024-09-11 DIAGNOSIS — Z8711 Personal history of peptic ulcer disease: Secondary | ICD-10-CM | POA: Diagnosis not present

## 2024-09-11 DIAGNOSIS — Z9221 Personal history of antineoplastic chemotherapy: Secondary | ICD-10-CM | POA: Diagnosis not present

## 2024-09-11 DIAGNOSIS — Z9226 Personal history of immune checkpoint inhibitor therapy: Secondary | ICD-10-CM | POA: Diagnosis not present

## 2024-09-11 DIAGNOSIS — Z79890 Hormone replacement therapy (postmenopausal): Secondary | ICD-10-CM | POA: Insufficient documentation

## 2024-09-11 DIAGNOSIS — I700 Atherosclerosis of aorta: Secondary | ICD-10-CM | POA: Diagnosis not present

## 2024-09-11 DIAGNOSIS — C3412 Malignant neoplasm of upper lobe, left bronchus or lung: Secondary | ICD-10-CM | POA: Insufficient documentation

## 2024-09-11 DIAGNOSIS — C3490 Malignant neoplasm of unspecified part of unspecified bronchus or lung: Secondary | ICD-10-CM

## 2024-09-11 DIAGNOSIS — Z860101 Personal history of adenomatous and serrated colon polyps: Secondary | ICD-10-CM | POA: Diagnosis not present

## 2024-09-11 DIAGNOSIS — Z79899 Other long term (current) drug therapy: Secondary | ICD-10-CM | POA: Diagnosis not present

## 2024-09-11 DIAGNOSIS — I2510 Atherosclerotic heart disease of native coronary artery without angina pectoris: Secondary | ICD-10-CM | POA: Insufficient documentation

## 2024-09-11 DIAGNOSIS — J432 Centrilobular emphysema: Secondary | ICD-10-CM | POA: Insufficient documentation

## 2024-09-11 DIAGNOSIS — K760 Fatty (change of) liver, not elsewhere classified: Secondary | ICD-10-CM | POA: Insufficient documentation

## 2024-09-11 DIAGNOSIS — Z923 Personal history of irradiation: Secondary | ICD-10-CM | POA: Insufficient documentation

## 2024-09-11 DIAGNOSIS — K449 Diaphragmatic hernia without obstruction or gangrene: Secondary | ICD-10-CM | POA: Diagnosis not present

## 2024-09-11 NOTE — Telephone Encounter (Signed)
 Scheduled patient for next appointments. Called and left a voicemail with the appointment details.

## 2024-09-11 NOTE — Progress Notes (Signed)
 [facility] Telephone:([phone]) [phone]   [phone]  OFFICE PROGRESS NOTE  Leila Lucie LABOR, MD [address] [place] [address]  DIAGNOSIS: Recurrent non-small cell lung cancer initially diagnosed as stage IA (T1b, N0, M0) non-small cell lung cancer, adenocarcinoma presented with left upper lobe lung nodule diagnosed in January 2020.  The patient has evidence for disease recurrence in AP window lymphadenopathy in December 2022.   There was insufficient material for the molecular studies by [facility].   PRIOR THERAPY:  1) Status post left lingulectomy with lymph node dissection under the care of Dr. Army on January 01, 2019. 2) Concurrent chemoradiation with carboplatin  for an AUC of 2 and paclitaxel  45 mg per metered squared weekly.  First dose on 01/24/2022.  Status post 7 cycles. Last dose was given March 14, 2022. 3) Consolidation treatment with immunotherapy with Imfinzi  1500 Mg IV every 4 weeks.  First dose Apr 12, 2022.  Status post 13 cycles.   CURRENT THERAPY: Observation  INTERVAL HISTORY: Jack Taylor 78 y.o. male returns to the clinic today for 6 month follow-up visit accompanied by his wife.Discussed the use of AI scribe software for clinical note transcription with the patient, who gave verbal consent to proceed.  History of Present Illness Jack Taylor is a 78 year old male with recurrent non-small cell lung cancer who presents for evaluation with repeat CT scan of the chest for restaging of his disease. He is accompanied by his wife.  He was diagnosed with recurrent non-small cell lung cancer in December 2022. He completed a course of concurrent chemoradiation with weekly carboplatin  and paclitaxel , followed by one year of consolidation immunotherapy with durvalumab , which was discontinued in April 2024. Since then, he has been under observation.  He reports no new symptoms since his last visit six months ago. No chest pain,  breathing issues, nausea, vomiting, diarrhea, or headaches. He mentions that he is 'doing good' and has been eating well, as noted by his weight maintenance.  There has been a change in his thyroid  medication, which was adjusted following blood work.     MEDICAL HISTORY: Past Medical History:  Diagnosis Date   Adenomatous colon polyp    Anxiety    Blood transfusion without reported diagnosis    Cancer (HCC)    Lung; 11/2018   CKD (chronic kidney disease)    stage III 11/09/18   COPD (chronic obstructive pulmonary disease) (HCC)    Gastric ulcer    GERD (gastroesophageal reflux disease)    High cholesterol    History of radiation therapy    Left Lung- 02/17/22-03/18/22-Dr. Lynwood Nasuti   Hypertension    Pre-diabetes    A1c 6.2% 11/09/18    ALLERGIES:  has no known allergies.  MEDICATIONS:  Current Outpatient Medications  Medication Sig Dispense Refill   albuterol  (PROVENTIL  HFA;VENTOLIN  HFA) 108 (90 Base) MCG/ACT inhaler Inhale 2 puffs into the lungs every 6 (six) hours as needed for wheezing or shortness of breath. Reported on 06/02/2016     amLODipine  (NORVASC ) 10 MG tablet Take 10 mg by mouth daily.      ANORO ELLIPTA 62.5-25 MCG/ACT AEPB Inhale 1 puff into the lungs daily.     esomeprazole (NEXIUM) 40 MG capsule Take 40 mg by mouth daily.      levothyroxine  (SYNTHROID ) 112 MCG tablet TAKE 1 TABLET BY MOUTH ONCE DAILY BEFORE BREAKFAST 30 tablet 0   losartan (COZAAR) 50 MG tablet Take  50 mg by mouth daily.      OVER THE COUNTER MEDICATION Take 1 tablet by mouth daily as needed (for constipation).     prochlorperazine  (COMPAZINE ) 10 MG tablet TAKE 1 TABLET BY MOUTH EVERY 6 HOURS AS NEEDED 30 tablet 0   rosuvastatin  (CRESTOR ) 10 MG tablet Take 10 mg by mouth daily.     sucralfate  (CARAFATE ) 1 g tablet Take 1 tablet (1 g total) by mouth 4 (four) times daily -  with meals and at bedtime. Crush and dissolve in 10 mL of warm water prior to swallowing 120 tablet 1   traZODone  (DESYREL) 50 MG tablet Take 50 mg by mouth at bedtime.     No current facility-administered medications for this visit.    SURGICAL HISTORY:  Past Surgical History:  Procedure Laterality Date   BRONCHIAL NEEDLE ASPIRATION BIOPSY  01/10/2022   Procedure: BRONCHIAL NEEDLE ASPIRATION BIOPSIES;  Surgeon: Shelah Lamar RAMAN, MD;  Location: [facility] ENDOSCOPY;  Service: Cardiopulmonary;;   COLONOSCOPY  2008   ESOPHAGOGASTRODUODENOSCOPY  11/23/2011   Procedure: ESOPHAGOGASTRODUODENOSCOPY (EGD);  Surgeon: Toribio Cedar, MD;  Location: [facility];  Service: Endoscopy;  Laterality: N/A;   INTERCOSTAL NERVE BLOCK Left 01/01/2019   Procedure: INTERCOSTAL NERVE BLOCK;  Surgeon: Army Dallas NOVAK, MD;  Location: [facility] OR;  Service: Thoracic;  Laterality: Left;   LUNG BIOPSY  12/18/2018   Procedure: LUNG BIOPSY - of Left Upper Lobe Biopsy of #7 Node Biopsy of #10L Node;  Surgeon: Army Dallas NOVAK, MD;  Location: [facility] OR;  Service: Thoracic;;   LYMPH NODE DISSECTION Left 01/01/2019   Procedure: LYMPH NODE DISSECTION;  Surgeon: Army Dallas NOVAK, MD;  Location: [facility] OR;  Service: Thoracic;  Laterality: Left;   UPPER GASTROINTESTINAL ENDOSCOPY  11/23/11   VIDEO ASSISTED THORACOSCOPY (VATS)/WEDGE RESECTION Left 01/01/2019   Procedure: VIDEO ASSISTED THORACOSCOPY (VATS)/ LEFT LINGULECTOMY;  Surgeon: Army Dallas NOVAK, MD;  Location: [facility] OR;  Service: Thoracic;  Laterality: Left;   VIDEO BRONCHOSCOPY WITH ENDOBRONCHIAL NAVIGATION N/A 12/18/2018   Procedure: VIDEO BRONCHOSCOPY WITH ENDOBRONCHIAL NAVIGATION with Placement of Fiducial Markers x 3;  Surgeon: Army Dallas NOVAK, MD;  Location: [facility] OR;  Service: Thoracic;  Laterality: N/A;   VIDEO BRONCHOSCOPY WITH ENDOBRONCHIAL ULTRASOUND N/A 12/18/2018   Procedure: VIDEO BRONCHOSCOPY WITH ENDOBRONCHIAL ULTRASOUND;  Surgeon: Army Dallas NOVAK, MD;  Location: [facility] OR;  Service: Thoracic;  Laterality: N/A;   VIDEO BRONCHOSCOPY WITH ENDOBRONCHIAL ULTRASOUND Left 01/10/2022   Procedure:  VIDEO BRONCHOSCOPY WITH ENDOBRONCHIAL ULTRASOUND;  Surgeon: Shelah Lamar RAMAN, MD;  Location: [facility] ENDOSCOPY;  Service: Cardiopulmonary;  Laterality: Left;    REVIEW OF SYSTEMS:  A comprehensive review of systems was negative except for: Respiratory: positive for dyspnea on exertion   PHYSICAL EXAMINATION: General appearance: alert, cooperative, fatigued, and no distress Head: Normocephalic, without obvious abnormality, atraumatic Neck: no adenopathy, no JVD, supple, symmetrical, trachea midline, and thyroid  not enlarged, symmetric, no tenderness/mass/nodules Lymph nodes: Cervical, supraclavicular, and axillary nodes normal. Resp: clear to auscultation bilaterally Back: symmetric, no curvature. ROM normal. No CVA tenderness. Cardio: regular rate and rhythm, S1, S2 normal, no murmur, click, rub or gallop GI: soft, non-tender; bowel sounds normal; no masses,  no organomegaly Extremities: extremities normal, atraumatic, no cyanosis or edema  ECOG PERFORMANCE STATUS: 1 - Symptomatic but completely ambulatory  Blood pressure 120/64, pulse 66, temperature (!) 97.3 F (36.3 C), resp. rate 17, height 5' 8 (1.727 m), weight 196 lb 4.8 oz (89 kg), SpO2 95%.  LABORATORY DATA: Lab Results  Component Value  Date   WBC 5.0 09/02/2024   HGB 14.0 09/02/2024   HCT 40.7 09/02/2024   MCV 92.9 09/02/2024   PLT 133 (L) 09/02/2024      Chemistry      Component Value Date/Time   NA 139 09/02/2024 0852   K 4.3 09/02/2024 0852   CL 105 09/02/2024 0852   CO2 28 09/02/2024 0852   BUN 17 09/02/2024 0852   CREATININE 2.24 (H) 09/02/2024 0852      Component Value Date/Time   CALCIUM  8.8 (L) 09/02/2024 0852   ALKPHOS 60 09/02/2024 0852   AST 17 09/02/2024 0852   ALT 22 09/02/2024 0852   BILITOT 0.5 09/02/2024 0852       RADIOGRAPHIC STUDIES: CT Chest Wo Contrast Result Date: 09/06/2024 CLINICAL DATA:  Non-small-cell lung cancer restaging * Tracking Code: BO * EXAM: CT CHEST WITHOUT CONTRAST  TECHNIQUE: Multidetector CT imaging of the chest was performed following the standard protocol without IV contrast. RADIATION DOSE REDUCTION: This exam was performed according to the departmental dose-optimization program which includes automated exposure control, adjustment of the mA and/or kV according to patient size and/or use of iterative reconstruction technique. COMPARISON:  03/05/2024 FINDINGS: Cardiovascular: Aortic atherosclerosis. Normal heart size. Three-vessel coronary artery calcifications. No pericardial effusion. Mediastinum/Nodes: No enlarged mediastinal, hilar, or axillary lymph nodes. Small hiatal hernia. Thyroid  gland, trachea, and esophagus demonstrate no significant findings. Lungs/Pleura: Unchanged postoperative appearance of the chest wedge resection of the posterior left upper lobe and lingula. Minimal centrilobular emphysema and diffuse bilateral bronchial wall thickening. Bandlike scarring of the right lung base. Unchanged tiny nodule in the deep left costophrenic recess measuring 0.5 cm, presumed benign (series 8, image 128). No pleural effusion or pneumothorax. Upper Abdomen: No acute abnormality.  Hepatic steatosis. Musculoskeletal: No chest wall abnormality. No acute osseous findings. IMPRESSION: 1. Unchanged postoperative appearance of the chest wedge with resection of the posterior left upper lobe and lingula. No evidence of recurrent or metastatic disease in the chest. 2. Minimal emphysema and diffuse bilateral bronchial wall thickening. 3. Coronary artery disease. 4. Hepatic steatosis. Aortic Atherosclerosis (ICD10-I70.0) and Emphysema (ICD10-J43.9). Electronically Signed   By: Marolyn JONETTA Jaksch M.D.   On: 09/06/2024 07:24    ASSESSMENT AND PLAN: This is a very pleasant 78 years old white male diagnosed with recurrent non-small cell lung cancer that was initially diagnosed as stage IA (T1b, N0, M0) non-small cell lung cancer, adenocarcinoma presented with left upper lobe lung nodule  in January 2020.  The patient has evidence for disease recurrence in AP window lymphadenopathy in December 2022.  He is status post left lingulectomy with lymph node dissection under the care of Dr. Army on January 01, 2019.  This was biopsy-proven to be recurrent adenocarcinoma of the lung. The patient underwent a course of concurrent chemoradiation with weekly carboplatin  for AUC of 2 and paclitaxel  45 Mg/M2 status post 7 cycles.  Last dose was given March 14, 2022.  The patient has partial response to this treatment. The patient underwent consolidation treatment with immunotherapy with Imfinzi  1500 Mg IV every 4 weeks status post 13 cycles.   The patient has been on observation and he is doing fine with no concerning complaints. He had repeat CT scan of the chest performed recently.  I personally independently reviewed the scan and discussed the result with the patient today.  His scan showed no concerning findings for disease recurrence or metastasis. Assessment and Plan Assessment & Plan Recurrent non-small cell lung cancer Recurrent non-small cell lung  cancer, initially diagnosed in December 2022. Status post concurrent chemoradiation with weekly carboplatin  and paclitaxel , followed by one year of consolidation immunotherapy with durvalumab , discontinued in April 2024. Currently under surveillance with no new symptoms. Recent CT scan shows no evidence of disease progression or metastasis. He remains asymptomatic with no chest pain, dyspnea, nausea, vomiting, diarrhea, or headaches. Overall, he is doing well and maintaining good weight. - Continue surveillance with repeat CT scan in six months - Schedule follow-up appointment in six months The patient was advised to call immediately if he has any concerning symptoms in the interval.   The patient voices understanding of current disease status and treatment options and is in agreement with the current care plan.  All questions were answered.  The patient knows to call the clinic with any problems, questions or concerns. We can certainly see the patient much sooner if necessary.  Disclaimer: This note was dictated with voice recognition software. Similar sounding words can inadvertently be transcribed and may not be corrected upon review.

## 2025-03-12 ENCOUNTER — Inpatient Hospital Stay: Attending: Internal Medicine

## 2025-03-12 ENCOUNTER — Inpatient Hospital Stay: Admitting: Internal Medicine
